# Patient Record
Sex: Male | Born: 1945 | Race: White | Hispanic: No | Marital: Married | Smoking: Former smoker
Health system: Southern US, Community
[De-identification: ages and names within clinical notes are randomized; demographics above are authoritative.]

## PROBLEM LIST (undated history)

## (undated) DIAGNOSIS — E119 Type 2 diabetes mellitus without complications: Secondary | ICD-10-CM

## (undated) DIAGNOSIS — C8590 Non-Hodgkin lymphoma, unspecified, unspecified site: Secondary | ICD-10-CM

## (undated) DIAGNOSIS — J449 Chronic obstructive pulmonary disease, unspecified: Secondary | ICD-10-CM

## (undated) DIAGNOSIS — I10 Essential (primary) hypertension: Secondary | ICD-10-CM

## (undated) DIAGNOSIS — N189 Chronic kidney disease, unspecified: Secondary | ICD-10-CM

## (undated) HISTORY — DX: Chronic kidney disease, unspecified: N18.9

## (undated) HISTORY — PX: APPENDECTOMY: SHX54

## (undated) HISTORY — PX: REPLACEMENT TOTAL KNEE: SUR1224

---

## 1999-04-18 ENCOUNTER — Emergency Department (HOSPITAL_COMMUNITY): Admission: EM | Admit: 1999-04-18 | Discharge: 1999-04-18 | Payer: Self-pay | Admitting: Emergency Medicine

## 2014-12-03 ENCOUNTER — Inpatient Hospital Stay (HOSPITAL_COMMUNITY)
Admission: EM | Admit: 2014-12-03 | Discharge: 2014-12-12 | DRG: 841 | Disposition: A | Discharge status: Home / Self Care | Payer: Medicare Other | Attending: Family Medicine | Admitting: Family Medicine

## 2014-12-03 ENCOUNTER — Emergency Department (HOSPITAL_COMMUNITY): Payer: Medicare Other

## 2014-12-03 ENCOUNTER — Encounter (HOSPITAL_COMMUNITY): Payer: Self-pay | Admitting: *Deleted

## 2014-12-03 ENCOUNTER — Inpatient Hospital Stay (HOSPITAL_COMMUNITY): Payer: Medicare Other

## 2014-12-03 DIAGNOSIS — E1140 Type 2 diabetes mellitus with diabetic neuropathy, unspecified: Secondary | ICD-10-CM | POA: Diagnosis present

## 2014-12-03 DIAGNOSIS — R1900 Intra-abdominal and pelvic swelling, mass and lump, unspecified site: Secondary | ICD-10-CM | POA: Diagnosis present

## 2014-12-03 DIAGNOSIS — Z87891 Personal history of nicotine dependence: Secondary | ICD-10-CM | POA: Diagnosis not present

## 2014-12-03 DIAGNOSIS — J441 Chronic obstructive pulmonary disease with (acute) exacerbation: Secondary | ICD-10-CM | POA: Diagnosis present

## 2014-12-03 DIAGNOSIS — N281 Cyst of kidney, acquired: Secondary | ICD-10-CM | POA: Diagnosis present

## 2014-12-03 DIAGNOSIS — N184 Chronic kidney disease, stage 4 (severe): Secondary | ICD-10-CM | POA: Diagnosis present

## 2014-12-03 DIAGNOSIS — M1712 Unilateral primary osteoarthritis, left knee: Secondary | ICD-10-CM | POA: Diagnosis present

## 2014-12-03 DIAGNOSIS — E46 Unspecified protein-calorie malnutrition: Secondary | ICD-10-CM | POA: Diagnosis present

## 2014-12-03 DIAGNOSIS — Z6833 Body mass index (BMI) 33.0-33.9, adult: Secondary | ICD-10-CM

## 2014-12-03 DIAGNOSIS — I959 Hypotension, unspecified: Secondary | ICD-10-CM | POA: Diagnosis present

## 2014-12-03 DIAGNOSIS — E669 Obesity, unspecified: Secondary | ICD-10-CM | POA: Diagnosis present

## 2014-12-03 DIAGNOSIS — N189 Chronic kidney disease, unspecified: Secondary | ICD-10-CM

## 2014-12-03 DIAGNOSIS — I712 Thoracic aortic aneurysm, without rupture: Secondary | ICD-10-CM | POA: Diagnosis present

## 2014-12-03 DIAGNOSIS — R5383 Other fatigue: Secondary | ICD-10-CM

## 2014-12-03 DIAGNOSIS — J9610 Chronic respiratory failure, unspecified whether with hypoxia or hypercapnia: Secondary | ICD-10-CM | POA: Diagnosis present

## 2014-12-03 DIAGNOSIS — Z9981 Dependence on supplemental oxygen: Secondary | ICD-10-CM

## 2014-12-03 DIAGNOSIS — R627 Adult failure to thrive: Secondary | ICD-10-CM | POA: Diagnosis present

## 2014-12-03 DIAGNOSIS — I129 Hypertensive chronic kidney disease with stage 1 through stage 4 chronic kidney disease, or unspecified chronic kidney disease: Secondary | ICD-10-CM | POA: Diagnosis present

## 2014-12-03 DIAGNOSIS — N19 Unspecified kidney failure: Secondary | ICD-10-CM | POA: Diagnosis not present

## 2014-12-03 DIAGNOSIS — C8290 Follicular lymphoma, unspecified, unspecified site: Secondary | ICD-10-CM | POA: Diagnosis not present

## 2014-12-03 DIAGNOSIS — I10 Essential (primary) hypertension: Secondary | ICD-10-CM | POA: Diagnosis not present

## 2014-12-03 DIAGNOSIS — M25569 Pain in unspecified knee: Secondary | ICD-10-CM

## 2014-12-03 DIAGNOSIS — E1122 Type 2 diabetes mellitus with diabetic chronic kidney disease: Secondary | ICD-10-CM

## 2014-12-03 DIAGNOSIS — N179 Acute kidney failure, unspecified: Secondary | ICD-10-CM | POA: Diagnosis present

## 2014-12-03 DIAGNOSIS — J449 Chronic obstructive pulmonary disease, unspecified: Secondary | ICD-10-CM | POA: Diagnosis not present

## 2014-12-03 DIAGNOSIS — C8293 Follicular lymphoma, unspecified, intra-abdominal lymph nodes: Principal | ICD-10-CM | POA: Diagnosis present

## 2014-12-03 DIAGNOSIS — C8333 Diffuse large B-cell lymphoma, intra-abdominal lymph nodes: Secondary | ICD-10-CM | POA: Diagnosis not present

## 2014-12-03 DIAGNOSIS — R062 Wheezing: Secondary | ICD-10-CM

## 2014-12-03 DIAGNOSIS — E1121 Type 2 diabetes mellitus with diabetic nephropathy: Secondary | ICD-10-CM

## 2014-12-03 DIAGNOSIS — R590 Localized enlarged lymph nodes: Secondary | ICD-10-CM | POA: Diagnosis present

## 2014-12-03 DIAGNOSIS — C8330 Diffuse large B-cell lymphoma, unspecified site: Secondary | ICD-10-CM

## 2014-12-03 HISTORY — DX: Essential (primary) hypertension: I10

## 2014-12-03 HISTORY — DX: Type 2 diabetes mellitus without complications: E11.9

## 2014-12-03 HISTORY — DX: Chronic obstructive pulmonary disease, unspecified: J44.9

## 2014-12-03 LAB — MAGNESIUM: Magnesium: 1.5 mg/dL — ABNORMAL LOW (ref 1.7–2.4)

## 2014-12-03 LAB — I-STAT TROPONIN, ED: TROPONIN I, POC: 0.02 ng/mL (ref 0.00–0.08)

## 2014-12-03 LAB — COMPREHENSIVE METABOLIC PANEL
ALBUMIN: 3.7 g/dL (ref 3.5–5.0)
ALT: 17 U/L (ref 17–63)
AST: 23 U/L (ref 15–41)
Alkaline Phosphatase: 57 U/L (ref 38–126)
Anion gap: 11 (ref 5–15)
BILIRUBIN TOTAL: 0.5 mg/dL (ref 0.3–1.2)
BUN: 62 mg/dL — ABNORMAL HIGH (ref 6–20)
CHLORIDE: 91 mmol/L — AB (ref 101–111)
CO2: 31 mmol/L (ref 22–32)
CREATININE: 5.8 mg/dL — AB (ref 0.61–1.24)
Calcium: >15 mg/dL (ref 8.9–10.3)
GFR calc Af Amer: 10 mL/min — ABNORMAL LOW (ref >60)
GFR, EST NON AFRICAN AMERICAN: 9 mL/min — AB (ref >60)
Glucose, Bld: 92 mg/dL (ref 65–99)
Potassium: 4.3 mmol/L (ref 3.5–5.1)
Sodium: 133 mmol/L — ABNORMAL LOW (ref 135–145)
Total Protein: 7.2 g/dL (ref 6.5–8.1)

## 2014-12-03 LAB — CBC
HCT: 41.3 % (ref 39.0–52.0)
Hemoglobin: 14 g/dL (ref 13.0–17.0)
MCH: 30.2 pg (ref 26.0–34.0)
MCHC: 33.9 g/dL (ref 30.0–36.0)
MCV: 89 fL (ref 78.0–100.0)
PLATELETS: 282 10*3/uL (ref 150–400)
RBC: 4.64 MIL/uL (ref 4.22–5.81)
RDW: 14 % (ref 11.5–15.5)
WBC: 9.1 10*3/uL (ref 4.0–10.5)

## 2014-12-03 LAB — BRAIN NATRIURETIC PEPTIDE: B Natriuretic Peptide: 102.8 pg/mL — ABNORMAL HIGH (ref 0.0–100.0)

## 2014-12-03 LAB — GLUCOSE, CAPILLARY
GLUCOSE-CAPILLARY: 43 mg/dL — AB (ref 65–99)
GLUCOSE-CAPILLARY: 59 mg/dL — AB (ref 65–99)
Glucose-Capillary: 60 mg/dL — ABNORMAL LOW (ref 65–99)

## 2014-12-03 LAB — PHOSPHORUS: Phosphorus: 4.9 mg/dL — ABNORMAL HIGH (ref 2.5–4.6)

## 2014-12-03 MED ORDER — HEPARIN SODIUM (PORCINE) 5000 UNIT/ML IJ SOLN
5000.0000 [IU] | Freq: Three times a day (TID) | INTRAMUSCULAR | Status: DC
  Administered 2014-12-04 (×2): 5000 [IU] via SUBCUTANEOUS
  Filled 2014-12-03 (×3): qty 1

## 2014-12-03 MED ORDER — CALCITONIN (SALMON) 200 UNIT/ML IJ SOLN
380.0000 [IU] | Freq: Two times a day (BID) | INTRAMUSCULAR | Status: AC
  Administered 2014-12-03 – 2014-12-06 (×6): 380 [IU] via INTRAMUSCULAR
  Filled 2014-12-03 (×9): qty 1.9

## 2014-12-03 MED ORDER — DEXTROSE 50 % IV SOLN
INTRAVENOUS | Status: AC
  Administered 2014-12-03: 21:00:00
  Filled 2014-12-03: qty 50

## 2014-12-03 MED ORDER — INSULIN ASPART 100 UNIT/ML ~~LOC~~ SOLN
0.0000 [IU] | SUBCUTANEOUS | Status: DC
  Administered 2014-12-05: 3 [IU] via SUBCUTANEOUS
  Administered 2014-12-05 – 2014-12-06 (×2): 2 [IU] via SUBCUTANEOUS
  Administered 2014-12-06 (×2): 1 [IU] via SUBCUTANEOUS
  Administered 2014-12-07: 2 [IU] via SUBCUTANEOUS

## 2014-12-03 MED ORDER — SODIUM CHLORIDE 0.9 % IV BOLUS (SEPSIS): 1000.0000 mL | Freq: Once | INTRAVENOUS | Status: DC

## 2014-12-03 MED ORDER — SODIUM CHLORIDE 0.9 % IJ SOLN
3.0000 mL | Freq: Two times a day (BID) | INTRAMUSCULAR | Status: DC
  Administered 2014-12-05 – 2014-12-11 (×9): 3 mL via INTRAVENOUS

## 2014-12-03 MED ORDER — INSULIN GLARGINE 100 UNIT/ML ~~LOC~~ SOLN
15.0000 [IU] | Freq: Every day | SUBCUTANEOUS | Status: DC
  Administered 2014-12-04 – 2014-12-09 (×6): 15 [IU] via SUBCUTANEOUS
  Filled 2014-12-03 (×8): qty 0.15

## 2014-12-03 MED ORDER — SODIUM CHLORIDE 0.9 % IV BOLUS (SEPSIS)
2000.0000 mL | Freq: Once | INTRAVENOUS | Status: AC
  Administered 2014-12-03: 2000 mL via INTRAVENOUS

## 2014-12-03 MED ORDER — SODIUM CHLORIDE 0.9 % IV SOLN
INTRAVENOUS | Status: AC
  Administered 2014-12-03 – 2014-12-04 (×2): via INTRAVENOUS
  Administered 2014-12-04: 1000 mL via INTRAVENOUS
  Administered 2014-12-04 – 2014-12-05 (×2): via INTRAVENOUS

## 2014-12-03 NOTE — ED Provider Notes (Signed)
CSN: [id number]     Arrival date & time 12/03/14  1553 History   First MD Initiated Contact with Patient 12/03/14 1754     Chief Complaint  Patient presents with  . Weakness  . Cough   Billy Gray is a 69 y.o. male with a history of COPD, hypertension, diabetes and renal disorder who presents to the emergency department complaining of generalized fatigue, low abdominal pain, and decreased appetite over the past 2 weeks. Patient reports over the past 2 weeks he has just been feeling very fatigued and has positive decreased appetite. He reports only eating a very small amount of food today. She reports lots of burping and acid reflux-like symptoms but has not been taking any Tums or acid reflux medication. Patient also reports having some positional lightheadedness. The patient reports he has a history of some kidney problems but he is unsure of exactly what. He reports he is diagnosed with a renal cyst but is not sure which kidney this was on. The patient reports he makes urine and is not on dialysis. Patient reports he has a chronic cough and is on 3 L of oxygen at home for his COPD. He denies any current shortness of breath. His last bowel movement was 2 days ago. The patient denies fevers, headaches, nausea, vomiting, chest pain, palpitations, current shortness of breath, urinary symptoms, hematuria, or rashes. His PCP is at the [facility].   (Consider location/radiation/quality/duration/timing/severity/associated sxs/prior Treatment) HPI  Past Medical History  Diagnosis Date  . COPD (chronic obstructive pulmonary disease)   . Hypertension   . Diabetes mellitus without complication   . Renal disorder    History reviewed. No pertinent past surgical history. History reviewed. No pertinent family history. History  Substance Use Topics  . Smoking status: Never Smoker   . Smokeless tobacco: Not on file  . Alcohol Use: Not on file    Review of Systems  Constitutional: Positive for chills,  appetite change and fatigue. Negative for fever.  HENT: Negative for congestion, ear pain and sore throat.   Eyes: Negative for pain and visual disturbance.  Respiratory: Positive for cough (chronic ). Negative for shortness of breath and wheezing.   Cardiovascular: Negative for chest pain and palpitations.  Gastrointestinal: Positive for abdominal pain. Negative for nausea, vomiting and diarrhea.  Genitourinary: Negative for dysuria, urgency, frequency, hematuria and difficulty urinating.  Musculoskeletal: Negative for back pain and neck pain.  Skin: Negative for rash.  Neurological: Positive for light-headedness. Negative for syncope and headaches.      Allergies  Review of patient's allergies indicates no known allergies.  Home Medications   Prior to Admission medications   Not on File   BP 128/71 mmHg  Pulse 74  Temp(Src) 98.7 F (37.1 C) (Oral)  Resp 17  SpO2 100% Physical Exam  Constitutional: He is oriented to person, place, and time. He appears well-developed and well-nourished. No distress.  Nontoxic appearing.  HENT:  Head: Normocephalic and atraumatic.  Mouth/Throat: Oropharynx is clear and moist. No oropharyngeal exudate.  Mucous membranes appear slightly dry.  Eyes: Conjunctivae are normal. Pupils are equal, round, and reactive to light. Right eye exhibits no discharge. Left eye exhibits no discharge.  Neck: Neck supple. No JVD present. No tracheal deviation present.  Cardiovascular: Normal rate, regular rhythm, normal heart sounds and intact distal pulses.  Exam reveals no gallop and no friction rub.   No murmur heard. Bilateral radial and dorsalis pedis pulses are intact.   Pulmonary/Chest: Effort normal  and breath sounds normal. No respiratory distress. He has no rales.  Mild scattered wheezes bilaterally but otherwise lungs are clear. On 3 L oxygen via [place].   Abdominal: Soft. He exhibits no distension and no mass. There is no tenderness. There is no rebound  and no guarding.  Abdomen is soft. Bowel sounds are hypoactive. No abdominal tenderness to palpation appreciated.  Musculoskeletal: He exhibits no edema.  Lymphadenopathy:    He has no cervical adenopathy.  Neurological: He is alert and oriented to person, place, and time. Coordination normal.  Skin: Skin is warm and dry. No rash noted. He is not diaphoretic. No erythema. No pallor.  Psychiatric: He has a normal mood and affect. His behavior is normal.  Nursing note and vitals reviewed.   ED Course  Procedures (including critical care time) Labs Review Labs Reviewed  BRAIN NATRIURETIC PEPTIDE - Abnormal; Notable for the following:    B Natriuretic Peptide 102.8 (*)    All other components within normal limits  COMPREHENSIVE METABOLIC PANEL - Abnormal; Notable for the following:    Sodium 133 (*)    Chloride 91 (*)    BUN 62 (*)    Creatinine, Ser 5.80 (*)    Calcium >15.0 (*)    GFR calc non Af Amer 9 (*)    GFR calc Af Amer 10 (*)    All other components within normal limits  CBC  I-STAT TROPOININ, ED  I-STAT TROPOININ, ED  POC OCCULT BLOOD, ED    Imaging Review Dg Chest 2 View  12/03/2014   CLINICAL DATA:  Fever and tachycardia  EXAM: CHEST  2 VIEW  COMPARISON:  None.  FINDINGS: The lungs are mildly hyperexpanded. There is scarring in the right base. There is mild central peribronchial thickening. There is a nipple shadow on the right. There is no edema or consolidation. The heart size is normal. The pulmonary vascularity is within normal limits. There is atherosclerotic change in aorta. There is no adenopathy. There is degenerative change in the thoracic spine.  IMPRESSION: Mild scarring right base. No edema or consolidation. Lungs mildly hyperexpanded with mild central peribronchial thickening. Suspect a degree of chronic bronchitis.   Electronically Signed   By: Lowella Grip III M.D.   On: 12/03/2014 16:55     EKG Interpretation   Date/Time:  Tuesday December 03 2014  16:23:23 EDT Ventricular Rate:  79 PR Interval:  272 QRS Duration: 96 QT Interval:  346 QTC Calculation: 396 R Axis:   -71 Text Interpretation:  Sinus rhythm with 1st degree A-V block Incomplete  right bundle branch block Left anterior fascicular block Possible  Anteroseptal infarct , age undetermined Abnormal ECG Confirmed by BEATON   MD, ROBERT ([id number]) on 12/03/2014 4:28:33 PM      Filed Vitals:   12/03/14 1621 12/03/14 1816 12/03/14 1821 12/03/14 1831  BP: 127/62 124/65  128/71  Pulse: 78  73 74  Temp: 98.7 F (37.1 C)     TempSrc: Oral     Resp: 16  19 17   SpO2: 97%  100% 100%     MDM   Meds given in ED:  Medications  0.9 %  sodium chloride infusion ( Intravenous New Bag/Given 12/03/14 2030)  insulin aspart (novoLOG) injection 0-9 Units (0 Units Subcutaneous Not Given 12/04/14 0107)  insulin glargine (LANTUS) injection 15 Units (15 Units Subcutaneous Not Given 12/04/14 0108)  calcitonin (MIACALCIN) injection 380 Units (380 Units Intramuscular Given 12/03/14 2115)  heparin injection 5,000 Units (5,000 Units  Subcutaneous Given 12/04/14 0108)  sodium chloride 0.9 % injection 3 mL (not administered)  sodium chloride 0.9 % bolus 2,000 mL (2,000 mLs Intravenous New Bag/Given 12/03/14 1826)  dextrose 50 % solution (  Given 12/03/14 2030)  dextrose 50 % solution (50 mLs  Given 12/04/14 0104)  gi cocktail (Maalox,Lidocaine,Donnatal) (30 mLs Oral Given 12/04/14 0104)    Current Discharge Medication List      Final diagnoses:  Hypercalcemia  Other fatigue   This is a 69 y.o. male with a history of COPD, hypertension, diabetes and renal disorder who presents to the emergency department complaining of generalized fatigue, low abdominal pain, and decreased appetite over the past 2 weeks. Patient reports over the past 2 weeks he has just been feeling very fatigued and has positive decreased appetite.  On exam the patient is afebrile nontoxic appearing. His abdomen is soft and nontender  palpation. The patient is making urine and has urinated in a urinal in his room. The patient's CMP returned with a calcium of greater than 15. Creatinine is 5.8. CBC is unremarkable. Patient given 2 L fluid bolus in ED. Chest x-ray shows chronic bronchitis. Patient with acute renal failure and hypercalcemia. Plan is to admit. Patient accepted for admission by Dr. Alcario Drought. Patient is in agreement with admission.   This patient was discussed with and evaluated by Dr. Darl Householder who agrees with assessment and plan.    Waynetta Pean, PA-C 12/04/14 0217  Wandra Arthurs, MD 12/05/14 [phone]

## 2014-12-03 NOTE — ED Notes (Signed)
PA at bedside.

## 2014-12-03 NOTE — H&P (Signed)
[facility] History and Physical  Billy Gray HQI:[id number] DOB: [birth year] DOA: 12/03/2014  Referring physician: EDP PCP: Pcp Not In System   Chief Complaint: Fatigue   HPI: Billy Gray is a 69 y.o. male who presents to the ED with c/o fatigue, generalized weakness, cough, decreased PO intake for the past 2 weeks or so.  Taking small amount of tums or peptobismol recently.  Has h/o kidney problems, diagnosed 3 months ago with CKD at the [place] [facility] and told his "creatinine was near needing dialysis".  Chronic cough, on 3L o2 at home for COPD.  Was also diagnosed with renal cyst at the [place] but unsure which side this was on.  Review of Systems: Systems reviewed.  As above, otherwise negative  Past Medical History  Diagnosis Date  . COPD (chronic obstructive pulmonary disease)   . Hypertension   . Diabetes mellitus without complication   . Renal disorder    History reviewed. No pertinent past surgical history. Social History:  reports that he has never smoked. He does not have any smokeless tobacco history on file. His alcohol and drug histories are not on file.  No Known Allergies  History reviewed. No pertinent family history.   Prior to Admission medications   Not on File   Physical Exam: Filed Vitals:   12/03/14 1831  BP: 128/71  Pulse: 74  Temp:   Resp: 17    BP 128/71 mmHg  Pulse 74  Temp(Src) 98.7 F (37.1 C) (Oral)  Resp 17  Ht 5\' 8"  (1.727 m)  Wt 95.255 kg (210 lb)  BMI 31.94 kg/m2  SpO2 100%  General Appearance:    Alert, oriented, no distress, appears stated age  Head:    Normocephalic, atraumatic  Eyes:    PERRL, EOMI, sclera non-icteric        Nose:   Nares without drainage or epistaxis. Mucosa, turbinates normal  Throat:   Moist mucous membranes. Oropharynx without erythema or exudate.  Neck:   Supple. No carotid bruits.  No thyromegaly.  No lymphadenopathy.   Back:     No CVA tenderness, no spinal tenderness  Lungs:     Clear to  auscultation bilaterally, without wheezes, rhonchi or rales  Chest wall:    No tenderness to palpitation  Heart:    Regular rate and rhythm without murmurs, gallops, rubs  Abdomen:     Soft, non-tender, nondistended, normal bowel sounds, no organomegaly  Genitalia:    deferred  Rectal:    deferred  Extremities:   No clubbing, cyanosis or edema.  Pulses:   2+ and symmetric all extremities  Skin:   Skin color, texture, turgor normal, no rashes or lesions  Lymph nodes:   Cervical, supraclavicular, and axillary nodes normal  Neurologic:   CNII-XII intact. Normal strength, sensation and reflexes      throughout    Labs on Admission:  Basic Metabolic Panel:  Recent Labs Lab 12/03/14 1631  NA 133*  K 4.3  CL 91*  CO2 31  GLUCOSE 92  BUN 62*  CREATININE 5.80*  CALCIUM >15.0*   Liver Function Tests:  Recent Labs Lab 12/03/14 1631  AST 23  ALT 17  ALKPHOS 57  BILITOT 0.5  PROT 7.2  ALBUMIN 3.7   No results for input(s): LIPASE, AMYLASE in the last 168 hours. No results for input(s): AMMONIA in the last 168 hours. CBC:  Recent Labs Lab 12/03/14 1631  WBC 9.1  HGB 14.0  HCT 41.3  MCV 89.0  PLT 282   Cardiac Enzymes: No results for input(s): CKTOTAL, CKMB, CKMBINDEX, TROPONINI in the last 168 hours.  BNP (last 3 results) No results for input(s): PROBNP in the last 8760 hours. CBG: No results for input(s): GLUCAP in the last 168 hours.  Radiological Exams on Admission: Dg Chest 2 View  12/03/2014   CLINICAL DATA:  Fever and tachycardia  EXAM: CHEST  2 VIEW  COMPARISON:  None.  FINDINGS: The lungs are mildly hyperexpanded. There is scarring in the right base. There is mild central peribronchial thickening. There is a nipple shadow on the right. There is no edema or consolidation. The heart size is normal. The pulmonary vascularity is within normal limits. There is atherosclerotic change in aorta. There is no adenopathy. There is degenerative change in the thoracic  spine.  IMPRESSION: Mild scarring right base. No edema or consolidation. Lungs mildly hyperexpanded with mild central peribronchial thickening. Suspect a degree of chronic bronchitis.   Electronically Signed   By: Lowella Grip III M.D.   On: 12/03/2014 16:55    EKG: Independently reviewed.  Assessment/Plan Principal Problem:   Hypercalcemia Active Problems:   Acute on chronic kidney failure   HTN (hypertension)   DM2 (diabetes mellitus, type 2)   1. Hypercalcemia - ddx includes 1. Malignancy: getting CT w/o contrast of chest/abd/pelvis to look for large tumor burden small cell, also getting PTHrp 2. Hyperparathyroidism - checking PTH 3. MM - less likely given the normal albumin and protein levels, but if other work up is negative would look further into this. 4. Does take 2000 iu vit D daily but this is unlikely to cause hypercalcemia to this extreme degree. 5. Treating with IVF, calcitonin per pharm consult.  BMP checks Q6H. 2. 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 attempting to rehydrate 2. baseline creatinine unknown 3. Likely will need nephrology consult in AM if no improvement, or at least nephrology follow up outpatient even if he improves given that he apparently has fairly severe CKD at baseline 3. HTN - holding lisinopril - HCTZ as nephrotoxic meds 4. DM2 - lantus 15 QHS, adding Q4H low dose SSI.    Code Status: Full  Family Communication: Family in room Disposition Plan: Admit to inpatient   Time spent: 66 min  GARDNER, JARED M. [facility] Pager [phone]  If 7AM-7PM, please contact the day team taking care of the patient [email or web address] Password [facility] 12/03/2014, 7:46 PM

## 2014-12-03 NOTE — Progress Notes (Signed)
PHARMACY CONSULT for Calcitonin  Indication: Hypercalcemia  69 yo male admitted with fatigue, weakness, and decreased PO intake. Pt was recently diagnosed with CKD, unclear what his baseline Scr or baseline Ca2+ is, currently SCr up to 5.8, eCrCl 10-15 ml/min, calcium > 15.   Plan -Start with calcitonin 4 IU/kg IM q12h -After 2 days if there is no improvement in Calcium, may increase dose -F/u PTH, repeat Ca tomorrow    Hughes Better, PharmD, BCPS Clinical Pharmacist Pager: [phone] 12/03/2014 8:04 PM

## 2014-12-03 NOTE — ED Notes (Signed)
Pt in c/o generalized fatigue and cough for the last week, states he hasn't been eating or drinking, denies n/v but states he has no appetite, arrived on 3L via [place], no distress noted

## 2014-12-04 ENCOUNTER — Inpatient Hospital Stay (HOSPITAL_COMMUNITY): Payer: Medicare Other

## 2014-12-04 DIAGNOSIS — R590 Localized enlarged lymph nodes: Secondary | ICD-10-CM | POA: Insufficient documentation

## 2014-12-04 LAB — BASIC METABOLIC PANEL
ANION GAP: 9 (ref 5–15)
Anion gap: 10 (ref 5–15)
Anion gap: 11 (ref 5–15)
Anion gap: 9 (ref 5–15)
Anion gap: 8 (ref 5–15)
BUN: 59 mg/dL — ABNORMAL HIGH (ref 6–20)
BUN: 60 mg/dL — ABNORMAL HIGH (ref 6–20)
BUN: 62 mg/dL — ABNORMAL HIGH (ref 6–20)
BUN: 61 mg/dL — AB (ref 6–20)
BUN: 61 mg/dL — AB (ref 6–20)
CALCIUM: 13.8 mg/dL — AB (ref 8.9–10.3)
CHLORIDE: 95 mmol/L — AB (ref 101–111)
CHLORIDE: 98 mmol/L — AB (ref 101–111)
CO2: 34 mmol/L — ABNORMAL HIGH (ref 22–32)
CO2: 29 mmol/L (ref 22–32)
CO2: 29 mmol/L (ref 22–32)
CO2: 30 mmol/L (ref 22–32)
CO2: 27 mmol/L (ref 22–32)
CREATININE: 5.43 mg/dL — AB (ref 0.61–1.24)
CREATININE: 5.17 mg/dL — AB (ref 0.61–1.24)
Calcium: 14.5 mg/dL (ref 8.9–10.3)
Calcium: 13.6 mg/dL (ref 8.9–10.3)
Calcium: 12.9 mg/dL — ABNORMAL HIGH (ref 8.9–10.3)
Chloride: 94 mmol/L — ABNORMAL LOW (ref 101–111)
Chloride: 99 mmol/L — ABNORMAL LOW (ref 101–111)
Chloride: 102 mmol/L (ref 101–111)
Creatinine, Ser: 5.44 mg/dL — ABNORMAL HIGH (ref 0.61–1.24)
Creatinine, Ser: 5.09 mg/dL — ABNORMAL HIGH (ref 0.61–1.24)
Creatinine, Ser: 4.8 mg/dL — ABNORMAL HIGH (ref 0.61–1.24)
GFR calc non Af Amer: 10 mL/min — ABNORMAL LOW (ref >60)
GFR calc non Af Amer: 11 mL/min — ABNORMAL LOW (ref >60)
GFR calc non Af Amer: 11 mL/min — ABNORMAL LOW (ref >60)
GFR, EST AFRICAN AMERICAN: 11 mL/min — AB (ref >60)
GFR, EST AFRICAN AMERICAN: 11 mL/min — AB (ref >60)
GFR, EST AFRICAN AMERICAN: 12 mL/min — AB (ref >60)
GFR, EST AFRICAN AMERICAN: 12 mL/min — AB (ref >60)
GFR, EST AFRICAN AMERICAN: 13 mL/min — AB (ref >60)
GFR, EST NON AFRICAN AMERICAN: 10 mL/min — AB (ref >60)
GFR, EST NON AFRICAN AMERICAN: 10 mL/min — AB (ref >60)
GLUCOSE: 74 mg/dL (ref 65–99)
GLUCOSE: 83 mg/dL (ref 65–99)
GLUCOSE: 118 mg/dL — AB (ref 65–99)
Glucose, Bld: 116 mg/dL — ABNORMAL HIGH (ref 65–99)
Glucose, Bld: 87 mg/dL (ref 65–99)
POTASSIUM: 3.9 mmol/L (ref 3.5–5.1)
POTASSIUM: 3.9 mmol/L (ref 3.5–5.1)
POTASSIUM: 4.1 mmol/L (ref 3.5–5.1)
Potassium: 4.1 mmol/L (ref 3.5–5.1)
Potassium: 4 mmol/L (ref 3.5–5.1)
SODIUM: 137 mmol/L (ref 135–145)
SODIUM: 137 mmol/L (ref 135–145)
Sodium: 138 mmol/L (ref 135–145)
Sodium: 135 mmol/L (ref 135–145)
Sodium: 137 mmol/L (ref 135–145)

## 2014-12-04 LAB — GLUCOSE, CAPILLARY
GLUCOSE-CAPILLARY: 77 mg/dL (ref 65–99)
GLUCOSE-CAPILLARY: 77 mg/dL (ref 65–99)
GLUCOSE-CAPILLARY: 81 mg/dL (ref 65–99)
Glucose-Capillary: 101 mg/dL — ABNORMAL HIGH (ref 65–99)
Glucose-Capillary: 116 mg/dL — ABNORMAL HIGH (ref 65–99)

## 2014-12-04 MED ORDER — ZOLPIDEM TARTRATE 5 MG PO TABS
5.0000 mg | ORAL_TABLET | Freq: Every evening | ORAL | Status: DC | PRN
  Administered 2014-12-04 – 2014-12-11 (×7): 5 mg via ORAL
  Filled 2014-12-04 (×7): qty 1

## 2014-12-04 MED ORDER — GI COCKTAIL ~~LOC~~
30.0000 mL | Freq: Once | ORAL | Status: AC
  Administered 2014-12-04: 30 mL via ORAL
  Filled 2014-12-04: qty 30

## 2014-12-04 MED ORDER — DEXTROSE 50 % IV SOLN
INTRAVENOUS | Status: AC
  Administered 2014-12-04: 50 mL
  Filled 2014-12-04: qty 50

## 2014-12-04 MED ORDER — HEPARIN SODIUM (PORCINE) 5000 UNIT/ML IJ SOLN
5000.0000 [IU] | Freq: Three times a day (TID) | INTRAMUSCULAR | Status: DC
  Administered 2014-12-06 – 2014-12-12 (×19): 5000 [IU] via SUBCUTANEOUS
  Filled 2014-12-04 (×24): qty 1

## 2014-12-04 NOTE — Consult Note (Signed)
Chief Complaint: Chief Complaint  Patient presents with  . Weakness  . Cough  retroperitoneal lymphadenopathy  Referring Physician(s): Dr Georgiann Mohs  History of Present Illness: Billy Gray is a 69 y.o. male   Pt presents to ED with extreme fatigue and weakness Wt loss; no appetite Work up shows hypercalcemia CT reveals retroperitoneal lymphadenopathy Request made for bx of same Dr Vernard Gambles has reviewed imaging and approves procedure  Past Medical History  Diagnosis Date  . COPD (chronic obstructive pulmonary disease)   . Hypertension   . Diabetes mellitus without complication   . Renal disorder     History reviewed. No pertinent past surgical history.  Allergies: Review of patient's allergies indicates no known allergies.  Medications: Prior to Admission medications   Medication Sig Start Date End Date Taking? Authorizing Provider  acetaminophen (TYLENOL) 500 MG tablet Take 500 mg by mouth every 6 (six) hours as needed for moderate pain.   Yes Historical Provider, MD  albuterol (PROVENTIL HFA;VENTOLIN HFA) 108 (90 BASE) MCG/ACT inhaler Inhale 2 puffs into the lungs every 6 (six) hours as needed for wheezing or shortness of breath.   Yes Historical Provider, MD  allopurinol (ZYLOPRIM) 300 MG tablet Take 150 mg by mouth daily.   Yes Historical Provider, MD  budesonide-formoterol (SYMBICORT) 160-4.5 MCG/ACT inhaler Inhale 2 puffs into the lungs every evening.   Yes Historical Provider, MD  glipiZIDE (GLUCOTROL) 10 MG tablet Take 10 mg by mouth daily before breakfast.   Yes Historical Provider, MD  insulin glargine (LANTUS) 100 UNIT/ML injection Inject 30 Units into the skin at bedtime.   Yes Historical Provider, MD  pravastatin (PRAVACHOL) 40 MG tablet Take 40 mg by mouth every evening.   Yes Historical Provider, MD  traZODone (DESYREL) 50 MG tablet Take 50 mg by mouth at bedtime.   Yes Historical Provider, MD  zolpidem (AMBIEN) 10 MG tablet Take 10 mg by mouth at bedtime.    Yes Historical Provider, MD  lisinopril-hydrochlorothiazide (PRINZIDE,ZESTORETIC) 20-12.5 MG per tablet Take 1 tablet by mouth daily.    Historical Provider, MD     History reviewed. No pertinent family history.  History   Social History  . Marital Status: Married    Spouse Name: N/A  . Number of Children: N/A  . Years of Education: N/A   Social History Main Topics  . Smoking status: Never Smoker   . Smokeless tobacco: Not on file  . Alcohol Use: Not on file  . Drug Use: Not on file  . Sexual Activity: Not on file   Other Topics Concern  . None   Social History Narrative  . None     Review of Systems: A 12 point ROS discussed and pertinent positives are indicated in the HPI above.  All other systems are negative.  Review of Systems  Constitutional: Positive for activity change, appetite change, fatigue and unexpected weight change. Negative for fever.  Respiratory: Negative for shortness of breath.   Gastrointestinal: Positive for abdominal pain.  Neurological: Positive for weakness.  Psychiatric/Behavioral: Negative for behavioral problems and confusion.    Vital Signs: BP 103/64 mmHg  Pulse 82  Temp(Src) 97.5 F (36.4 C) (Oral)  Resp 21  Ht 5\' 8"  (1.727 m)  Wt 210 lb (95.255 kg)  BMI 31.94 kg/m2  SpO2 99%  Physical Exam  Constitutional: He is oriented to person, place, and time.  Cardiovascular: Normal rate and regular rhythm.   No murmur heard. Pulmonary/Chest: Effort normal. He has no wheezes.  Abdominal: Soft. Bowel sounds are normal. He exhibits distension. There is tenderness.  Musculoskeletal: Normal range of motion.  Neurological: He is alert and oriented to person, place, and time.  Skin: Skin is warm.  Psychiatric: He has a normal mood and affect. His behavior is normal. Judgment and thought content normal.  Nursing note and vitals reviewed.   Mallampati Score:  MD Evaluation Airway: WNL Heart: WNL Abdomen: WNL Chest/ Lungs: WNL ASA   Classification: 3 Mallampati/Airway Score: One  Imaging: Ct Abdomen Pelvis Wo Contrast  12/03/2014   CLINICAL DATA:  Generalized fatigue and cough for a week. No appetite.  EXAM: CT CHEST, ABDOMEN AND PELVIS WITHOUT CONTRAST  TECHNIQUE: Multidetector CT imaging of the chest, abdomen and pelvis was performed following the standard protocol without IV contrast.  COMPARISON:  None.  FINDINGS: CT CHEST FINDINGS  Normal heart size. Coronary artery calcifications. 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 Aortic calcification. No definite lymphadenopathy in the chest although non conscious examination limits evaluation of hilar lymph nodes. Esophagus is decompressed.  Mild linear atelectasis or fibrosis in the lung bases. No airspace disease or consolidation. No parenchymal mass lesion identified. Mild emphysematous changes in the upper lungs. No pleural effusions. No pneumothorax.  CT ABDOMEN AND PELVIS FINDINGS  The unenhanced appearance of the liver, spleen, gallbladder, pancreas, adrenal glands, is unremarkable. Kidneys appear atrophic without hydronephrosis. Multiple bilateral renal mass lesions probably representing cysts. Calcification of the aorta without aneurysm. There is extensive lymphadenopathy in the retroperitoneum involving predominantly the pericaval region and extending from the abdominal hiatus down into the upper pelvis. Conglomerate mass of lymph nodes in the pericaval region measures up to about 14.3 x 12.3 x 7.4 cm. Additional enlarged lymph nodes are also demonstrated in the periaortic regions. With the large lymph node mass displaces the aorta and partially surrounds the aorta. Inferior vena cava cannot be distinguished from the mass. This is likely to represent lymphoma although sarcoma or other retroperitoneal mass could also have this appearance. Stomach, small bowel, and colon are not abnormally distended. No free air or free fluid in the abdomen. Mild infiltration in the mesenteries  probably reactive. Abdominal wall musculature appears intact.  Pelvis: Prostate gland is mildly enlarged. Bladder wall is not thickened. Appendix is not identified. No free or loculated pelvic fluid collections. Small bilateral inguinal hernias containing fat.  Bones: Diffuse degenerative changes throughout the thoracic and lumbar spine. No vertebral compression deformities. No destructive bone lesions. Degenerative changes in the hips. Sacrum and pelvis appear intact.  IMPRESSION: No evidence of active pulmonary disease. 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 Recommend annual imaging followup by CTA or MRA. This recommendation follows 2010 ACCF/AHA/AATS/ACR/ASA/SCA/SCAI/SIR/STS/SVM Guidelines for the Diagnosis and Management of Patients with Thoracic Aortic Disease. Circulation. 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 Multiple bilateral renal lesions likely representing cysts. Bilateral inguinal hernias containing fat.   Electronically Signed   By: Lucienne Capers M.D.   On: 12/03/2014 23:15   Dg Chest 2 View  12/03/2014   CLINICAL DATA:  Fever and tachycardia  EXAM: CHEST  2 VIEW  COMPARISON:  None.  FINDINGS: The lungs are mildly hyperexpanded. There is scarring in the right base. There is mild central peribronchial thickening. There is a nipple shadow on the right. There is no edema or consolidation. The heart size is normal. The pulmonary vascularity is within normal limits. There is atherosclerotic change in aorta. There is no adenopathy. There is degenerative change in  the thoracic spine.  IMPRESSION: Mild scarring right base. No edema or consolidation. Lungs mildly hyperexpanded with mild central peribronchial thickening. Suspect a degree of chronic bronchitis.   Electronically Signed   By: Lowella Grip III M.D.   On: 12/03/2014 16:55   Dg Knee 1-2 Views Left  12/04/2014   CLINICAL DATA:  Medial left knee swelling at distal femur.  Pain with weight-bearing. No injury.  EXAM: LEFT KNEE - 1-2 VIEW  COMPARISON:  None.  FINDINGS: Examination demonstrates moderate osteoarthritic change involving the lateral compartment and to a lesser extent involving the medial compartment and patellofemoral joints. No evidence of fracture or dislocation. Suggestion of a significant joint effusion. Mild calcified plaque over the femoral artery.  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 Significant joint effusion.   Electronically Signed   By: Marin Olp M.D.   On: 12/04/2014 13:41   Ct Chest Wo Contrast  12/03/2014   CLINICAL DATA:  Generalized fatigue and cough for a week. No appetite.  EXAM: CT CHEST, ABDOMEN AND PELVIS WITHOUT CONTRAST  TECHNIQUE: Multidetector CT imaging of the chest, abdomen and pelvis was performed following the standard protocol without IV contrast.  COMPARISON:  None.  FINDINGS: CT CHEST FINDINGS  Normal heart size. Coronary artery calcifications. 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 Aortic calcification. No definite lymphadenopathy in the chest although non conscious examination limits evaluation of hilar lymph nodes. Esophagus is decompressed.  Mild linear atelectasis or fibrosis in the lung bases. No airspace disease or consolidation. No parenchymal mass lesion identified. Mild emphysematous changes in the upper lungs. No pleural effusions. No pneumothorax.  CT ABDOMEN AND PELVIS FINDINGS  The unenhanced appearance of the liver, spleen, gallbladder, pancreas, adrenal glands, is unremarkable. Kidneys appear atrophic without hydronephrosis. Multiple bilateral renal mass lesions probably representing cysts. Calcification of the aorta without aneurysm. There is extensive lymphadenopathy in the retroperitoneum involving predominantly the pericaval region and extending from the abdominal hiatus down into the upper pelvis. Conglomerate mass of lymph nodes in the  pericaval region measures up to about 14.3 x 12.3 x 7.4 cm. Additional enlarged lymph nodes are also demonstrated in the periaortic regions. With the large lymph node mass displaces the aorta and partially surrounds the aorta. Inferior vena cava cannot be distinguished from the mass. This is likely to represent lymphoma although sarcoma or other retroperitoneal mass could also have this appearance. Stomach, small bowel, and colon are not abnormally distended. No free air or free fluid in the abdomen. Mild infiltration in the mesenteries probably reactive. Abdominal wall musculature appears intact.  Pelvis: Prostate gland is mildly enlarged. Bladder wall is not thickened. Appendix is not identified. No free or loculated pelvic fluid collections. Small bilateral inguinal hernias containing fat.  Bones: Diffuse degenerative changes throughout the thoracic and lumbar spine. No vertebral compression deformities. No destructive bone lesions. Degenerative changes in the hips. Sacrum and pelvis appear intact.  IMPRESSION: No evidence of active pulmonary disease. 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 Recommend annual imaging followup by CTA or MRA. This recommendation follows 2010 ACCF/AHA/AATS/ACR/ASA/SCA/SCAI/SIR/STS/SVM Guidelines for the Diagnosis and Management of Patients with Thoracic Aortic Disease. Circulation. 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 Multiple bilateral renal lesions likely representing cysts. Bilateral inguinal hernias containing fat.   Electronically Signed   By: Lucienne Capers M.D.   On: 12/03/2014 23:15    Labs:  CBC:  Recent Labs  12/03/14 1631  WBC 9.1  HGB 14.0  HCT 41.3  PLT 282    COAGS: No results for input(s): INR, APTT in the last 8760 hours.  BMP:  Recent Labs  12/03/14 1631 12/03/14 2255 12/04/14 0338 12/04/14 1110  NA 133* 138 135 137  K 4.3 3.9 3.9 4.1  CL 91* 94* 95* 99*  CO2 31 34* 29 29   GLUCOSE 92 74 116* 87  BUN 62* 59* 60* 62*  CALCIUM >15.0* >15.0* 14.5* 13.8*  CREATININE 5.80* 5.44* 5.43* 5.17*  GFRNONAA 9* 10* 10* 10*  GFRAA 10* 11* 11* 12*    LIVER FUNCTION TESTS:  Recent Labs  12/03/14 1631  BILITOT 0.5  AST 23  ALT 17  ALKPHOS 57  PROT 7.2  ALBUMIN 3.7    TUMOR MARKERS: No results for input(s): AFPTM, CEA, CA199, CHROMGRNA in the last 8760 hours.  Assessment and Plan:  RP LAN Weakness; wt loss Scheduled for bx 6/16 in Rad Risks and Benefits discussed with the patient including, but not limited to bleeding, infection, damage to adjacent structures or low yield requiring additional tests. All of the patient's questions were answered, patient is agreeable to proceed. Consent signed and in chart.   Thank you for this interesting consult.  I greatly enjoyed meeting [facility] and look forward to participating in their care.  Signed: Barrie Sigmund A 12/04/2014, 2:08 PM   I spent a total of 40 Minutes    in face to face in clinical consultation, greater than 50% of which was counseling/coordinating care for RP LAN bx

## 2014-12-04 NOTE — Progress Notes (Signed)
New Admission Note: late entry  Arrival Method: stretcher  Mental Orientation: alert and oriented x 4Telemetry:box 29 Assessment: Completed Skin:intact DL:[id number] a/c nsl Pain:denies Tubes:none Safety Measures: Safety Fall Prevention Plan has been given, discussed and signed Admission: Completed [address] Orientation: Patient has been orientated to the room, unit and staff.  Family:at bedside on arrival to unit  Orders have been reviewed and implemented. Will continue to monitor the patient. Call light has been placed within reach and bed alarm has been activated.   Amado Coe, RN Phone number: [phone]

## 2014-12-04 NOTE — Progress Notes (Signed)
Text paged M.D. about the one time episode of 0.04 pause and one time episode of heart rate of 30/min non sustained.Patient is asymptomatic.No order received.

## 2014-12-04 NOTE — Progress Notes (Signed)
[facility] PROGRESS NOTE  Billy Gray XNA:[id number] DOB: Feb 04, 1946 DOA: 12/03/2014 PCP: Pcp Not In System  Assessment/Plan: 1. Hypercalcemia- patient came with calcium of 15.0, this morning calcium is down to 13.8 after started IV hydration with normal saline. Nephrology has been consulted, IV pamidronate will be started as per nephrology. Patient also started on calcitonin. 2. Malignancy- CT abdomen reveals retroperitoneal lymphadenopathy, Conglomerate mass of lymph nodes in the pericaval region measures up to about 14.3 x 12.3 x 7.4 cm. Additional enlarged lymph nodes are also demonstrated in the periaortic regions. Called and discussed with hematology/oncology on call Dr. Alen Blew, who recommends getting the core biopsy per IR. Will consult IR for the core biopsy. 3. Left knee swelling- chronic patient has been told by [facility] that he will need left knee  Replacement. Will check x-ray of the left knee. 4. 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 Nephrology has been consulted. HCTZ and ACE inhibitor have been held per nephrology. 5. Diabetes mellitus- continue sliding scale insulin, Lantus. 6. Thoracic aortic aneurysm- CT chest shows 4.1 cm of ascending thoracic aortic aneurysm, patient will need yearly CT chest to assess the growth. I discussed with patient and his wife at bedside 7. Hypertension- patient is currently hypotensive, both HCTZ as well as ACE inhibitor are on hold.  Code Status: Full code Family Communication: Discussed with patient's wife at bedside Disposition Plan: Home when stable   Consultants:  Nephrology  Procedures:  None  Antibiotics:  None  HPI/Subjective: 69 y.o. male who presents to the ED with c/o fatigue, generalized weakness, cough, decreased PO intake for the past 2 weeks or so. Taking small amount of tums or peptobismol recently. Has h/o kidney problems, diagnosed 3  months ago with CKD at the [place] [facility] and told his "creatinine was near needing dialysis". Chronic cough, on 3L o2 at home for COPD. Was also diagnosed with renal cyst at the [place] but unsure which side this was on.  This morning patient denies any chest pain or shortness of breath no nausea vomiting. Has knee brace the left leg   Objective: Filed Vitals:   12/04/14 0858  BP: 103/64  Pulse: 82  Temp: 97.5 F (36.4 C)  Resp: 21    Intake/Output Summary (Last 24 hours) at 12/04/14 1449 Last data filed at 12/04/14 0616  Gross per 24 hour  Intake 1582.5 ml  Output    301 ml  Net 1281.5 ml   Filed Weights   12/03/14 1900  Weight: 95.255 kg (210 lb)    Exam:   General:  Appears in no acute distress  Cardiovascular: S1-S2 is regular, no murmurs rubs or gallops  Respiratory: Clear to auscultation bilaterally  Abdomen: Soft, nontender, no organomegaly  Musculoskeletal: Knee brace in the left leg, with swelling noted in the left knee   Data Reviewed: Basic Metabolic Panel:  Recent Labs Lab 12/03/14 1631 12/03/14 2104 12/03/14 2255 12/04/14 0338 12/04/14 1110  NA 133*  --  138 135 137  K 4.3  --  3.9 3.9 4.1  CL 91*  --  94* 95* 99*  CO2 31  --  34* 29 29  GLUCOSE 92  --  74 116* 87  BUN 62*  --  59* 60* 62*  CREATININE 5.80*  --  5.44* 5.43* 5.17*  CALCIUM >15.0*  --  >15.0* 14.5* 13.8*  MG  --  1.5*  --   --   --  PHOS  --  4.9*  --   --   --    Liver Function Tests:  Recent Labs Lab 12/03/14 1631  AST 23  ALT 17  ALKPHOS 57  BILITOT 0.5  PROT 7.2  ALBUMIN 3.7   No results for input(s): LIPASE, AMYLASE in the last 168 hours. No results for input(s): AMMONIA in the last 168 hours. CBC:  Recent Labs Lab 12/03/14 1631  WBC 9.1  HGB 14.0  HCT 41.3  MCV 89.0  PLT 282   Cardiac Enzymes: No results for input(s): CKTOTAL, CKMB, CKMBINDEX, TROPONINI in the last 168 hours. BNP (last 3 results)  Recent Labs  12/03/14 1631  BNP 102.8*     ProBNP (last 3 results) No results for input(s): PROBNP in the last 8760 hours.  CBG:  Recent Labs Lab 12/03/14 2040 12/03/14 2341 12/04/14 0351 12/04/14 0804  GLUCAP 43* 60*  59* 116* 77    No results found for this or any previous visit (from the past 240 hour(s)).   Studies: Ct Abdomen Pelvis Wo Contrast  12/03/2014   CLINICAL DATA:  Generalized fatigue and cough for a week. No appetite.  EXAM: CT CHEST, ABDOMEN AND PELVIS WITHOUT CONTRAST  TECHNIQUE: Multidetector CT imaging of the chest, abdomen and pelvis was performed following the standard protocol without IV contrast.  COMPARISON:  None.  FINDINGS: CT CHEST FINDINGS  Normal heart size. Coronary artery calcifications. 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 Aortic calcification. No definite lymphadenopathy in the chest although non conscious examination limits evaluation of hilar lymph nodes. Esophagus is decompressed.  Mild linear atelectasis or fibrosis in the lung bases. No airspace disease or consolidation. No parenchymal mass lesion identified. Mild emphysematous changes in the upper lungs. No pleural effusions. No pneumothorax.  CT ABDOMEN AND PELVIS FINDINGS  The unenhanced appearance of the liver, spleen, gallbladder, pancreas, adrenal glands, is unremarkable. Kidneys appear atrophic without hydronephrosis. Multiple bilateral renal mass lesions probably representing cysts. Calcification of the aorta without aneurysm. There is extensive lymphadenopathy in the retroperitoneum involving predominantly the pericaval region and extending from the abdominal hiatus down into the upper pelvis. Conglomerate mass of lymph nodes in the pericaval region measures up to about 14.3 x 12.3 x 7.4 cm. Additional enlarged lymph nodes are also demonstrated in the periaortic regions. With the large lymph node mass displaces the aorta and partially surrounds the aorta. Inferior vena cava cannot be distinguished from the mass. This is likely to  represent lymphoma although sarcoma or other retroperitoneal mass could also have this appearance. Stomach, small bowel, and colon are not abnormally distended. No free air or free fluid in the abdomen. Mild infiltration in the mesenteries probably reactive. Abdominal wall musculature appears intact.  Pelvis: Prostate gland is mildly enlarged. Bladder wall is not thickened. Appendix is not identified. No free or loculated pelvic fluid collections. Small bilateral inguinal hernias containing fat.  Bones: Diffuse degenerative changes throughout the thoracic and lumbar spine. No vertebral compression deformities. No destructive bone lesions. Degenerative changes in the hips. Sacrum and pelvis appear intact.  IMPRESSION: No evidence of active pulmonary disease. 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 Recommend annual imaging followup by CTA or MRA. This recommendation follows 2010 ACCF/AHA/AATS/ACR/ASA/SCA/SCAI/SIR/STS/SVM Guidelines for the Diagnosis and Management of Patients with Thoracic Aortic Disease. Circulation. 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 Multiple bilateral renal lesions likely representing cysts. Bilateral inguinal hernias containing fat.   Electronically Signed   By: Lucienne Capers  M.D.   On: 12/03/2014 23:15   Dg Chest 2 View  12/03/2014   CLINICAL DATA:  Fever and tachycardia  EXAM: CHEST  2 VIEW  COMPARISON:  None.  FINDINGS: The lungs are mildly hyperexpanded. There is scarring in the right base. There is mild central peribronchial thickening. There is a nipple shadow on the right. There is no edema or consolidation. The heart size is normal. The pulmonary vascularity is within normal limits. There is atherosclerotic change in aorta. There is no adenopathy. There is degenerative change in the thoracic spine.  IMPRESSION: Mild scarring right base. No edema or consolidation. Lungs mildly hyperexpanded with mild central  peribronchial thickening. Suspect a degree of chronic bronchitis.   Electronically Signed   By: Lowella Grip III M.D.   On: 12/03/2014 16:55   Dg Knee 1-2 Views Left  12/04/2014   CLINICAL DATA:  Medial left knee swelling at distal femur. Pain with weight-bearing. No injury.  EXAM: LEFT KNEE - 1-2 VIEW  COMPARISON:  None.  FINDINGS: Examination demonstrates moderate osteoarthritic change involving the lateral compartment and to a lesser extent involving the medial compartment and patellofemoral joints. No evidence of fracture or dislocation. Suggestion of a significant joint effusion. Mild calcified plaque over the femoral artery.  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 Significant joint effusion.   Electronically Signed   By: Marin Olp M.D.   On: 12/04/2014 13:41   Ct Chest Wo Contrast  12/03/2014   CLINICAL DATA:  Generalized fatigue and cough for a week. No appetite.  EXAM: CT CHEST, ABDOMEN AND PELVIS WITHOUT CONTRAST  TECHNIQUE: Multidetector CT imaging of the chest, abdomen and pelvis was performed following the standard protocol without IV contrast.  COMPARISON:  None.  FINDINGS: CT CHEST FINDINGS  Normal heart size. Coronary artery calcifications. 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 Aortic calcification. No definite lymphadenopathy in the chest although non conscious examination limits evaluation of hilar lymph nodes. Esophagus is decompressed.  Mild linear atelectasis or fibrosis in the lung bases. No airspace disease or consolidation. No parenchymal mass lesion identified. Mild emphysematous changes in the upper lungs. No pleural effusions. No pneumothorax.  CT ABDOMEN AND PELVIS FINDINGS  The unenhanced appearance of the liver, spleen, gallbladder, pancreas, adrenal glands, is unremarkable. Kidneys appear atrophic without hydronephrosis. Multiple bilateral renal mass lesions probably representing cysts. Calcification of  the aorta without aneurysm. There is extensive lymphadenopathy in the retroperitoneum involving predominantly the pericaval region and extending from the abdominal hiatus down into the upper pelvis. Conglomerate mass of lymph nodes in the pericaval region measures up to about 14.3 x 12.3 x 7.4 cm. Additional enlarged lymph nodes are also demonstrated in the periaortic regions. With the large lymph node mass displaces the aorta and partially surrounds the aorta. Inferior vena cava cannot be distinguished from the mass. This is likely to represent lymphoma although sarcoma or other retroperitoneal mass could also have this appearance. Stomach, small bowel, and colon are not abnormally distended. No free air or free fluid in the abdomen. Mild infiltration in the mesenteries probably reactive. Abdominal wall musculature appears intact.  Pelvis: Prostate gland is mildly enlarged. Bladder wall is not thickened. Appendix is not identified. No free or loculated pelvic fluid collections. Small bilateral inguinal hernias containing fat.  Bones: Diffuse degenerative changes throughout the thoracic and lumbar spine. No vertebral compression deformities. No destructive bone lesions. Degenerative changes in the hips. Sacrum and pelvis appear intact.  IMPRESSION: No  evidence of active pulmonary disease. 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 Recommend annual imaging followup by CTA or MRA. This recommendation follows 2010 ACCF/AHA/AATS/ACR/ASA/SCA/SCAI/SIR/STS/SVM Guidelines for the Diagnosis and Management of Patients with Thoracic Aortic Disease. Circulation. 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 Multiple bilateral renal lesions likely representing cysts. Bilateral inguinal hernias containing fat.   Electronically Signed   By: Lucienne Capers M.D.   On: 12/03/2014 23:15    Scheduled Meds: . calcitonin  380 Units Intramuscular Q12H  . heparin  5,000 Units  Subcutaneous 3 times per day  . insulin aspart  0-9 Units Subcutaneous 6 times per day  . insulin glargine  15 Units Subcutaneous QHS  . sodium chloride  3 mL Intravenous Q12H   Continuous Infusions: . sodium chloride 1,000 mL (12/04/14 0615)    Principal Problem:   Hypercalcemia Active Problems:   Acute on chronic kidney failure   HTN (hypertension)   DM2 (diabetes mellitus, type 2)   Retroperitoneal lymphadenopathy    Time spent: *25 minutes    [facility] Pager [phone]. If 7PM-7AM, please contact night-coverage at [email or web address], password [facility] 12/04/2014, 2:49 PM  LOS: 1 day

## 2014-12-04 NOTE — Consult Note (Signed)
[name] Admit Date: 12/03/2014 12/04/2014 Rexene Agent Requesting Physician:  Darrick Meigs MD  Reason for Consult:  Hypercalcemia, CKD4-5 HPI:  21N seen at request of Dr. Darrick Meigs for the evaluation of severe hypercalcemia and CKD 4.  Patient's pertinent past history includes hypertension, diabetes. He was recently seen at the [place] in [place], 09/2014. At that time he was told he had late stage chronic kidney disease, nearing dialysis. He does not recall his serum creatinine value. Resented to the emergency room yesterday with progressive fatigue, loss of appetite, and a 30 pound weight loss over the past 3 months. He denies night sweats or fevers. He was found to have a serum calcium value of 15, serum creatinine 5.44 and serum bicarbonate of 34. He has been treated with hydration and calcitonin. He takes comes on an intermittent basis, only 1 or 2 per day. He takes low-dose hydrochlorothiazide, 12.5 mg daily. He also takes 2000 international units of 25-OH vitamin D.  CT the abdomen and pelvis demonstrated extensive retroperitoneal lymphadenopathy concerning for lymphoma. PTH, PTH RP have been drawn and are pending. Phosphorus value was 4.9. He is currently hydrating with 150 mL of normal saline per hour.  CREATININE, SER (mg/dL)  Date Value  12/04/2014 5.17*  12/04/2014 5.43*  12/03/2014 5.44*  12/03/2014 5.80*  ] I/Os:  ROS Balance of 12 systems is negative w/ exceptions as above  PMH  Past Medical History  Diagnosis Date  . COPD (chronic obstructive pulmonary disease)   . Hypertension   . Diabetes mellitus without complication   . Renal disorder    PSH History reviewed. No pertinent past surgical history. FH History reviewed. No pertinent family history. SH  reports that he has never smoked. He does not have any smokeless tobacco history on file. His alcohol and drug histories are not on file. Allergies No Known Allergies Home medications Prior to Admission medications    Medication Sig Start Date End Date Taking? Authorizing Provider  acetaminophen (TYLENOL) 500 MG tablet Take 500 mg by mouth every 6 (six) hours as needed for moderate pain.   Yes Historical Provider, MD  albuterol (PROVENTIL HFA;VENTOLIN HFA) 108 (90 BASE) MCG/ACT inhaler Inhale 2 puffs into the lungs every 6 (six) hours as needed for wheezing or shortness of breath.   Yes Historical Provider, MD  allopurinol (ZYLOPRIM) 300 MG tablet Take 150 mg by mouth daily.   Yes Historical Provider, MD  budesonide-formoterol (SYMBICORT) 160-4.5 MCG/ACT inhaler Inhale 2 puffs into the lungs every evening.   Yes Historical Provider, MD  glipiZIDE (GLUCOTROL) 10 MG tablet Take 10 mg by mouth daily before breakfast.   Yes Historical Provider, MD  insulin glargine (LANTUS) 100 UNIT/ML injection Inject 30 Units into the skin at bedtime.   Yes Historical Provider, MD  pravastatin (PRAVACHOL) 40 MG tablet Take 40 mg by mouth every evening.   Yes Historical Provider, MD  traZODone (DESYREL) 50 MG tablet Take 50 mg by mouth at bedtime.   Yes Historical Provider, MD  zolpidem (AMBIEN) 10 MG tablet Take 10 mg by mouth at bedtime.   Yes Historical Provider, MD  lisinopril-hydrochlorothiazide (PRINZIDE,ZESTORETIC) 20-12.5 MG per tablet Take 1 tablet by mouth daily.    Historical Provider, MD    Current Medications Scheduled Meds: . calcitonin  380 Units Intramuscular Q12H  . heparin  5,000 Units Subcutaneous 3 times per day  . insulin aspart  0-9 Units Subcutaneous 6 times per day  . insulin glargine  15 Units Subcutaneous QHS  .  sodium chloride  3 mL Intravenous Q12H   Continuous Infusions: . sodium chloride 1,000 mL (12/04/14 0615)   PRN Meds:.  CBC  Recent Labs Lab 12/03/14 1631  WBC 9.1  HGB 14.0  HCT 41.3  MCV 89.0  PLT 169   Basic Metabolic Panel  Recent Labs Lab 12/03/14 1631 12/03/14 2104 12/03/14 2255 12/04/14 0338 12/04/14 1110  NA 133*  --  138 135 137  K 4.3  --  3.9 3.9 4.1  CL  91*  --  94* 95* 99*  CO2 31  --  34* 29 29  GLUCOSE 92  --  74 116* 87  BUN 62*  --  59* 60* 62*  CREATININE 5.80*  --  5.44* 5.43* 5.17*  CALCIUM >15.0*  --  >15.0* 14.5* 13.8*  PHOS  --  4.9*  --   --   --     Physical Exam  Blood pressure 103/64, pulse 82, temperature 97.5 F (36.4 C), temperature source Oral, resp. rate 21, height 5\' 8"  (1.727 m), weight 95.255 kg (210 lb), SpO2 99 %. GEN: NAD, obese ENT: NCAT EYES: EOMI LYMPH: no palpable supraclavicular, cervical. Axillary, or femoral nodes CV: RRR. No rub PULM: CTAB ABD: protuberant, s/nt SKIN: no rashes/lesions CVE:LFYBO b/l LEE   Assessment/Plan 12M with severe hypercalcemia, CKD4-5? With possible AKI, CT demonstrated retroperitoneal lymphadenopathy concerning for lymphoma.    1. Hypercalcemia, Severe 1. Agree with NS hydration and Calcitonin 2. Tentatively to give pamidronate tomorrow once clearly euvolemic 3. Monitor closely with volume resuscitation and low GFR -- stop time 0700 6/16 and reassess 4. Etiology 1. Likely driven by 1,75 OH excess related to lymphoma 2. Exclude monoclonal process with SPEP and sFLC 3. PTH and PTHrP pending, expect both to be low 4. No strong history to suggest milk alkali syndrome 5. 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 Unsure of baseline, sees [facility] [place], told 'nearing dialysis' 2. Some improvement in SCr with hydration 3. Cont to follow closely 4. Other electrolytes ok 5. 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 Retroperitoneal LAN 1. 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 IR to eval for core biopsy 3. TRH discussed with Heme-Onc 4. DM2 5. HTN 1. Hold Thiazide and ACEi 2. Follow 3. CCB likely next agent if needed   Pearson Grippe MD [phone] pgr 12/04/2014, 12:54 PM

## 2014-12-05 ENCOUNTER — Inpatient Hospital Stay (HOSPITAL_COMMUNITY): Payer: Medicare Other

## 2014-12-05 DIAGNOSIS — R590 Localized enlarged lymph nodes: Secondary | ICD-10-CM

## 2014-12-05 LAB — PROTEIN ELECTROPHORESIS, SERUM
A/G Ratio: 1 (ref 0.7–1.7)
ALBUMIN ELP: 3.1 g/dL (ref 2.9–4.4)
ALPHA-1-GLOBULIN: 0.3 g/dL (ref 0.0–0.4)
ALPHA-2-GLOBULIN: 0.9 g/dL (ref 0.4–1.0)
BETA GLOBULIN: 1 g/dL (ref 0.7–1.3)
GAMMA GLOBULIN: 0.8 g/dL (ref 0.4–1.8)
Globulin, Total: 3 g/dL (ref 2.2–3.9)
TOTAL PROTEIN ELP: 6.1 g/dL (ref 6.0–8.5)

## 2014-12-05 LAB — GLUCOSE, CAPILLARY
GLUCOSE-CAPILLARY: 175 mg/dL — AB (ref 65–99)
GLUCOSE-CAPILLARY: 201 mg/dL — AB (ref 65–99)
GLUCOSE-CAPILLARY: 70 mg/dL (ref 65–99)
Glucose-Capillary: 95 mg/dL (ref 65–99)
Glucose-Capillary: 76 mg/dL (ref 65–99)

## 2014-12-05 LAB — COMPREHENSIVE METABOLIC PANEL
ALT: 16 U/L — ABNORMAL LOW (ref 17–63)
AST: 24 U/L (ref 15–41)
Albumin: 2.9 g/dL — ABNORMAL LOW (ref 3.5–5.0)
Alkaline Phosphatase: 50 U/L (ref 38–126)
Anion gap: 7 (ref 5–15)
BUN: 57 mg/dL — ABNORMAL HIGH (ref 6–20)
CALCIUM: 12.9 mg/dL — AB (ref 8.9–10.3)
CO2: 27 mmol/L (ref 22–32)
Chloride: 105 mmol/L (ref 101–111)
Creatinine, Ser: 4.56 mg/dL — ABNORMAL HIGH (ref 0.61–1.24)
GFR calc Af Amer: 14 mL/min — ABNORMAL LOW (ref >60)
GFR calc non Af Amer: 12 mL/min — ABNORMAL LOW (ref >60)
Glucose, Bld: 78 mg/dL (ref 65–99)
Potassium: 4 mmol/L (ref 3.5–5.1)
SODIUM: 139 mmol/L (ref 135–145)
TOTAL PROTEIN: 5.8 g/dL — AB (ref 6.5–8.1)
Total Bilirubin: 0.4 mg/dL (ref 0.3–1.2)

## 2014-12-05 LAB — APTT: APTT: 31 s (ref 24–37)

## 2014-12-05 LAB — PROTIME-INR
INR: 1.19 (ref 0.00–1.49)
Prothrombin Time: 15.3 seconds — ABNORMAL HIGH (ref 11.6–15.2)

## 2014-12-05 LAB — KAPPA/LAMBDA LIGHT CHAINS
KAPPA FREE LGHT CHN: 74.06 mg/L — AB (ref 3.30–19.40)
Kappa, lambda light chain ratio: 1.7 — ABNORMAL HIGH (ref 0.26–1.65)
Lambda free light chains: 43.45 mg/L — ABNORMAL HIGH (ref 5.71–26.30)

## 2014-12-05 MED ORDER — SODIUM CHLORIDE 0.9 % IV SOLN
INTRAVENOUS | Status: DC
  Administered 2014-12-05: 75 mL via INTRAVENOUS
  Administered 2014-12-05: via INTRAVENOUS
  Administered 2014-12-06: 75 mL/h via INTRAVENOUS

## 2014-12-05 MED ORDER — HYDROCODONE-ACETAMINOPHEN 5-325 MG PO TABS
1.0000 | ORAL_TABLET | ORAL | Status: DC | PRN
  Administered 2014-12-05: 2 via ORAL
  Administered 2014-12-07: 1 via ORAL
  Filled 2014-12-05: qty 2
  Filled 2014-12-05: qty 1

## 2014-12-05 MED ORDER — FENTANYL CITRATE (PF) 100 MCG/2ML IJ SOLN
INTRAMUSCULAR | Status: AC | PRN
  Administered 2014-12-05: 50 ug via INTRAVENOUS

## 2014-12-05 MED ORDER — SODIUM CHLORIDE 0.9 % IV SOLN: INTRAVENOUS | Status: DC

## 2014-12-05 MED ORDER — LIDOCAINE HCL 1 % IJ SOLN
INTRAMUSCULAR | Status: AC
  Filled 2014-12-05: qty 20

## 2014-12-05 MED ORDER — MIDAZOLAM HCL 2 MG/2ML IJ SOLN
INTRAMUSCULAR | Status: AC | PRN
  Administered 2014-12-05 (×2): 1 mg via INTRAVENOUS

## 2014-12-05 MED ORDER — MIDAZOLAM HCL 2 MG/2ML IJ SOLN
INTRAMUSCULAR | Status: AC
  Filled 2014-12-05: qty 2

## 2014-12-05 MED ORDER — FENTANYL CITRATE (PF) 100 MCG/2ML IJ SOLN
INTRAMUSCULAR | Status: AC
  Filled 2014-12-05: qty 2

## 2014-12-05 MED ORDER — PAMIDRONATE DISODIUM 90 MG/10ML IV SOLN
90.0000 mg | Freq: Once | INTRAVENOUS | Status: AC
  Administered 2014-12-05: 90 mg via INTRAVENOUS
  Filled 2014-12-05: qty 10

## 2014-12-05 NOTE — Procedures (Signed)
CT core biopsy R retroperitoneal mass, 18g x6 to surg path No complication No blood loss. See complete dictation in [facility].

## 2014-12-05 NOTE — Progress Notes (Signed)
CRITICAL VALUE ALERT  Critical value received:  Serum calcium 15.0  Date of notification:  12/05/14  Time of notification:  8333  Critical value read back:Yes.    Nurse who received alert:  Shelbie Hutching  MD notified (1st page):  Raliegh Ip Schorr  Time of first page:  [phone]  MD notified (2nd page):  Time of second page:  Responding MD:  Lamar Blinks  Time MD responded:  [phone]

## 2014-12-05 NOTE — Progress Notes (Signed)
Admit: 12/03/2014 LOS: 2  49M with severe hypercalcemia, CKD4-5? With possible AKI, CT demonstrated retroperitoneal lymphadenopathy concerning for lymphoma.   Subjective:  Ca improved to 12.9 this AM No SOB, LEE For Bx with IR this AM No co, poor PO intake  PTH suppressed     Filed Weights   12/03/14 1900 12/04/14 2025  Weight: 95.255 kg (210 lb) 98.2 kg (216 lb 7.9 oz)    Scheduled Meds: . calcitonin  380 Units Intramuscular Q12H  . heparin  5,000 Units Subcutaneous 3 times per day  . insulin aspart  0-9 Units Subcutaneous 6 times per day  . insulin glargine  15 Units Subcutaneous QHS  . pamidronate  90 mg Intravenous Once  . sodium chloride  3 mL Intravenous Q12H   Continuous Infusions:  PRN Meds:.zolpidem  Current Labs: reviewed  PTH suppressed at 15 1,25 Vit D pending SPEP pending sFLC Pending PTH rP pending   Physical Exam:  Blood pressure 117/69, pulse 87, temperature 98.2 F (36.8 C), temperature source Oral, resp. rate 20, height 5\' 8"  (1.727 m), weight 98.2 kg (216 lb 7.9 oz), SpO2 95 %. GEN: NAD, obese ENT: NCAT EYES: EOMI LYMPH: no palpable supraclavicular, cervical. Axillary, or femoral nodes CV: RRR. No rub PULM: CTAB ABD: protuberant, s/nt SKIN: no rashes/lesions XIP:JASNK b/l LEE  A/P 1. Hypercalcemia, Severe 1. Pamidronate 90mg  IV today 2. Cont Calcitonin for another 24h 3. Restart NS IVF @ 59mL/hr x24h 4. Follow volume status closely 5. Etiology 1. Likely driven by 5,39 OH excess related to lymphoma 2. Exclude monoclonal process with SPEP and sFLC 3. PTH suppressed and PTHrP pending, expect both to be low 4. No strong history to suggest milk alkali syndrome 5. 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 Unsure of baseline, sees [facility] [place], told 'nearing dialysis' 2. Some improvement in SCr with hydration 3. Cont to follow closely 4. Other electrolytes ok 5. Numerous cysts on CT, suspect acquired cystic  disease 6. Daily weights, Daily Renal Panel, Strict I/Os, Avoid nephrotoxins (NSAIDs, judicious IV Contrast) 3. Extensive Retroperitoneal LAN 1. 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 IR to eval for core biopsy today 3. TRH discussed with Heme-Onc 4. DM2 5. HTN 1. Hold Thiazide and ACEi 2. Follow 3. CCB likely next agent if needed  Pearson Grippe MD 12/05/2014, 9:30 AM   Recent Labs Lab 12/03/14 2104  12/04/14 1110 12/04/14 1652 12/04/14 2303  NA  --   < > 137 137 137  K  --   < > 4.1 4.0 4.1  CL  --   < > 99* 98* 102  CO2  --   < > 29 30 27   GLUCOSE  --   < > 87 83 118*  BUN  --   < > 62* 61* 61*  CREATININE  --   < > 5.17* 5.09* 4.80*  CALCIUM 15.0*  < > 13.8* 13.6* 12.9*  PHOS 4.9*  --   --   --   --   < > = values in this interval not displayed.  Recent Labs Lab 12/03/14 1631  WBC 9.1  HGB 14.0  HCT 41.3  MCV 89.0  PLT 282

## 2014-12-05 NOTE — Progress Notes (Signed)
[facility] PROGRESS NOTE  Billy Gray OVF:[id number] DOB: 05-Jan-1946 DOA: 12/03/2014 PCP: Pcp Not In System  Assessment/Plan: 1. Hypercalcemia- patient came with calcium of 15.0, this morning calcium is down to 12.9 after started IV hydration with normal saline. Nephrology has been consulted, IV pamidronate will be started as per nephrology. Patient also started on calcitonin. We will follow CMP in a.m. 2. Malignancy- CT abdomen reveals retroperitoneal lymphadenopathy, Conglomerate mass of lymph nodes in the pericaval region measures up to about 14.3 x 12.3 x 7.4 cm. Additional enlarged lymph nodes are also demonstrated in the periaortic regions. Called and discussed with hematology/oncology on call Dr. Alen Blew, who recommends getting the core biopsy per IR. Plan for biopsy from the retroperitoneal lymph nodes today per IR. 3. Left knee swelling- x-ray of left knee reveals significant joint effusion. A shunt says that he had arthrocentesis performed 2 weeks ago at [facility]. Will consult orthopedics after the lymph node biopsy for arthrocentesis. 4. CKD stage IV- patient is currently not on hemodialysis, was told by [facility] [place] that he is nearing dialysis. Nephrology has been consulted. HCTZ and ACE inhibitor have been held per nephrology. 5. Diabetes mellitus- continue sliding scale insulin, Lantus. Blood glucose is well controlled. 6. Thoracic aortic aneurysm- stable ,CT chest shows 4.1 cm of ascending thoracic aortic aneurysm, patient will need yearly CT chest to assess the growth. I discussed with patient and his wife at bedside. 7. Hypertension- patient is currently hypotensive, both HCTZ as well as ACE inhibitor are on hold.  Code Status: Full code Family Communication: Discussed with patient's wife at bedside Disposition Plan: Home when stable   Consultants:  Nephrology  Procedures:  None  Antibiotics:  None  HPI/Subjective: 69 y.o. male who presents to the ED  with c/o fatigue, generalized weakness, cough, decreased PO intake for the past 2 weeks or so. Taking small amount of tums or peptobismol recently. Has h/o kidney problems, diagnosed 3 months ago with CKD at the [place] [facility] and told his "creatinine was near needing dialysis". Chronic cough, on 3L o2 at home for COPD. Was also diagnosed with renal cyst at the [place] but unsure which side this was on.  This morning patient is npo for the lymph node biopsy.   Objective: Filed Vitals:   12/05/14 1201  BP: 115/62  Pulse: 84  Temp:   Resp: 17    Intake/Output Summary (Last 24 hours) at 12/05/14 1723 Last data filed at 12/05/14 1400  Gross per 24 hour  Intake   1335 ml  Output    350 ml  Net    985 ml   Filed Weights   12/03/14 1900 12/04/14 2025  Weight: 95.255 kg (210 lb) 98.2 kg (216 lb 7.9 oz)    Exam:   General:  Appears in no acute distress  Cardiovascular: S1-S2 is regular, no murmurs rubs or gallops  Respiratory: Clear to auscultation bilaterally  Abdomen: Soft, nontender, no organomegaly  Musculoskeletal: Knee brace in the left leg, with swelling noted in the left knee   Data Reviewed: Basic Metabolic Panel:  Recent Labs Lab 12/03/14 2104  12/04/14 0338 12/04/14 1110 12/04/14 1652 12/04/14 2303 12/05/14 1042  NA  --   < > 135 137 137 137 139  K  --   < > 3.9 4.1 4.0 4.1 4.0  CL  --   < > 95* 99* 98* 102 105  CO2  --   < > 29 29 30 27 27   GLUCOSE  --   < >  116* 87 83 118* 78  BUN  --   < > 60* 62* 61* 61* 57*  CREATININE  --   < > 5.43* 5.17* 5.09* 4.80* 4.56*  CALCIUM 15.0*  < > 14.5* 13.8* 13.6* 12.9* 12.9*  MG 1.5*  --   --   --   --   --   --   PHOS 4.9*  --   --   --   --   --   --   < > = values in this interval not displayed. Liver Function Tests:  Recent Labs Lab 12/03/14 1631 12/05/14 1042  AST 23 24  ALT 17 16*  ALKPHOS 57 50  BILITOT 0.5 0.4  PROT 7.2 5.8*  ALBUMIN 3.7 2.9*   No results for input(s): LIPASE, AMYLASE in the last 168  hours. No results for input(s): AMMONIA in the last 168 hours. CBC:  Recent Labs Lab 12/03/14 1631  WBC 9.1  HGB 14.0  HCT 41.3  MCV 89.0  PLT 282   Cardiac Enzymes: No results for input(s): CKTOTAL, CKMB, CKMBINDEX, TROPONINI in the last 168 hours. BNP (last 3 results)  Recent Labs  12/03/14 1631  BNP 102.8*    ProBNP (last 3 results) No results for input(s): PROBNP in the last 8760 hours.  CBG:  Recent Labs Lab 12/04/14 2035 12/04/14 2356 12/05/14 0417 12/05/14 0757 12/05/14 1646  GLUCAP 81 101* 95 76 201*    No results found for this or any previous visit (from the past 240 hour(s)).   Studies: Ct Abdomen Pelvis Wo Contrast  12/03/2014   CLINICAL DATA:  Generalized fatigue and cough for a week. No appetite.  EXAM: CT CHEST, ABDOMEN AND PELVIS WITHOUT CONTRAST  TECHNIQUE: Multidetector CT imaging of the chest, abdomen and pelvis was performed following the standard protocol without IV contrast.  COMPARISON:  None.  FINDINGS: CT CHEST FINDINGS  Normal heart size. Coronary artery calcifications. 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 Aortic calcification. No definite lymphadenopathy in the chest although non conscious examination limits evaluation of hilar lymph nodes. Esophagus is decompressed.  Mild linear atelectasis or fibrosis in the lung bases. No airspace disease or consolidation. No parenchymal mass lesion identified. Mild emphysematous changes in the upper lungs. No pleural effusions. No pneumothorax.  CT ABDOMEN AND PELVIS FINDINGS  The unenhanced appearance of the liver, spleen, gallbladder, pancreas, adrenal glands, is unremarkable. Kidneys appear atrophic without hydronephrosis. Multiple bilateral renal mass lesions probably representing cysts. Calcification of the aorta without aneurysm. There is extensive lymphadenopathy in the retroperitoneum involving predominantly the pericaval region and extending from the abdominal hiatus down into the upper pelvis.  Conglomerate mass of lymph nodes in the pericaval region measures up to about 14.3 x 12.3 x 7.4 cm. Additional enlarged lymph nodes are also demonstrated in the periaortic regions. With the large lymph node mass displaces the aorta and partially surrounds the aorta. Inferior vena cava cannot be distinguished from the mass. This is likely to represent lymphoma although sarcoma or other retroperitoneal mass could also have this appearance. Stomach, small bowel, and colon are not abnormally distended. No free air or free fluid in the abdomen. Mild infiltration in the mesenteries probably reactive. Abdominal wall musculature appears intact.  Pelvis: Prostate gland is mildly enlarged. Bladder wall is not thickened. Appendix is not identified. No free or loculated pelvic fluid collections. Small bilateral inguinal hernias containing fat.  Bones: Diffuse degenerative changes throughout the thoracic and lumbar spine. No vertebral compression deformities. No destructive  bone lesions. Degenerative changes in the hips. Sacrum and pelvis appear intact.  IMPRESSION: No evidence of active pulmonary disease. 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 Recommend annual imaging followup by CTA or MRA. This recommendation follows 2010 ACCF/AHA/AATS/ACR/ASA/SCA/SCAI/SIR/STS/SVM Guidelines for the Diagnosis and Management of Patients with Thoracic Aortic Disease. Circulation. 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 Multiple bilateral renal lesions likely representing cysts. Bilateral inguinal hernias containing fat.   Electronically Signed   By: Lucienne Capers M.D.   On: 12/03/2014 23:15   Dg Chest 2 View  12/03/2014   CLINICAL DATA:  Fever and tachycardia  EXAM: CHEST  2 VIEW  COMPARISON:  None.  FINDINGS: The lungs are mildly hyperexpanded. There is scarring in the right base. There is mild central peribronchial thickening. There is a nipple shadow on the right. There is  no edema or consolidation. The heart size is normal. The pulmonary vascularity is within normal limits. There is atherosclerotic change in aorta. There is no adenopathy. There is degenerative change in the thoracic spine.  IMPRESSION: Mild scarring right base. No edema or consolidation. Lungs mildly hyperexpanded with mild central peribronchial thickening. Suspect a degree of chronic bronchitis.   Electronically Signed   By: Lowella Grip III M.D.   On: 12/03/2014 16:55   Dg Knee 1-2 Views Left  12/04/2014   CLINICAL DATA:  Medial left knee swelling at distal femur. Pain with weight-bearing. No injury.  EXAM: LEFT KNEE - 1-2 VIEW  COMPARISON:  None.  FINDINGS: Examination demonstrates moderate osteoarthritic change involving the lateral compartment and to a lesser extent involving the medial compartment and patellofemoral joints. No evidence of fracture or dislocation. Suggestion of a significant joint effusion. Mild calcified plaque over the femoral artery.  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 Significant joint effusion.   Electronically Signed   By: Marin Olp M.D.   On: 12/04/2014 13:41   Ct Chest Wo Contrast  12/03/2014   CLINICAL DATA:  Generalized fatigue and cough for a week. No appetite.  EXAM: CT CHEST, ABDOMEN AND PELVIS WITHOUT CONTRAST  TECHNIQUE: Multidetector CT imaging of the chest, abdomen and pelvis was performed following the standard protocol without IV contrast.  COMPARISON:  None.  FINDINGS: CT CHEST FINDINGS  Normal heart size. Coronary artery calcifications. 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 Aortic calcification. No definite lymphadenopathy in the chest although non conscious examination limits evaluation of hilar lymph nodes. Esophagus is decompressed.  Mild linear atelectasis or fibrosis in the lung bases. No airspace disease or consolidation. No parenchymal mass lesion identified. Mild emphysematous  changes in the upper lungs. No pleural effusions. No pneumothorax.  CT ABDOMEN AND PELVIS FINDINGS  The unenhanced appearance of the liver, spleen, gallbladder, pancreas, adrenal glands, is unremarkable. Kidneys appear atrophic without hydronephrosis. Multiple bilateral renal mass lesions probably representing cysts. Calcification of the aorta without aneurysm. There is extensive lymphadenopathy in the retroperitoneum involving predominantly the pericaval region and extending from the abdominal hiatus down into the upper pelvis. Conglomerate mass of lymph nodes in the pericaval region measures up to about 14.3 x 12.3 x 7.4 cm. Additional enlarged lymph nodes are also demonstrated in the periaortic regions. With the large lymph node mass displaces the aorta and partially surrounds the aorta. Inferior vena cava cannot be distinguished from the mass. This is likely to represent lymphoma although sarcoma or other retroperitoneal mass could also have this appearance. Stomach, small bowel, and  colon are not abnormally distended. No free air or free fluid in the abdomen. Mild infiltration in the mesenteries probably reactive. Abdominal wall musculature appears intact.  Pelvis: Prostate gland is mildly enlarged. Bladder wall is not thickened. Appendix is not identified. No free or loculated pelvic fluid collections. Small bilateral inguinal hernias containing fat.  Bones: Diffuse degenerative changes throughout the thoracic and lumbar spine. No vertebral compression deformities. No destructive bone lesions. Degenerative changes in the hips. Sacrum and pelvis appear intact.  IMPRESSION: No evidence of active pulmonary disease. 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 Recommend annual imaging followup by CTA or MRA. This recommendation follows 2010 ACCF/AHA/AATS/ACR/ASA/SCA/SCAI/SIR/STS/SVM Guidelines for the Diagnosis and Management of Patients with Thoracic Aortic Disease. Circulation. 2010; 121: X323-F573  Prominent  retroperitoneal lymphadenopathy most likely to represent lymphoma. Multiple bilateral renal lesions likely representing cysts. Bilateral inguinal hernias containing fat.   Electronically Signed   By: Lucienne Capers M.D.   On: 12/03/2014 23:15    Scheduled Meds: . calcitonin  380 Units Intramuscular Q12H  . fentaNYL      . heparin  5,000 Units Subcutaneous 3 times per day  . insulin aspart  0-9 Units Subcutaneous 6 times per day  . insulin glargine  15 Units Subcutaneous QHS  . lidocaine      . midazolam      . sodium chloride  3 mL Intravenous Q12H   Continuous Infusions: . sodium chloride 75 mL (12/05/14 1100)    Principal Problem:   Hypercalcemia Active Problems:   Acute on chronic kidney failure   HTN (hypertension)   DM2 (diabetes mellitus, type 2)   Retroperitoneal lymphadenopathy    Time spent: *25 minutes    [facility] Pager [phone]. If 7PM-7AM, please contact night-coverage at [email or web address], password [email or web address] 12/05/2014, 5:23 PM  LOS: 2 days

## 2014-12-06 LAB — COMPREHENSIVE METABOLIC PANEL
ALBUMIN: 2.8 g/dL — AB (ref 3.5–5.0)
ALT: 15 U/L — ABNORMAL LOW (ref 17–63)
ANION GAP: 7 (ref 5–15)
AST: 20 U/L (ref 15–41)
Alkaline Phosphatase: 50 U/L (ref 38–126)
BILIRUBIN TOTAL: 0.5 mg/dL (ref 0.3–1.2)
BUN: 51 mg/dL — AB (ref 6–20)
CALCIUM: 11.9 mg/dL — AB (ref 8.9–10.3)
CO2: 26 mmol/L (ref 22–32)
CREATININE: 4.1 mg/dL — AB (ref 0.61–1.24)
Chloride: 105 mmol/L (ref 101–111)
GFR, EST AFRICAN AMERICAN: 16 mL/min — AB (ref >60)
GFR, EST NON AFRICAN AMERICAN: 14 mL/min — AB (ref >60)
Glucose, Bld: 95 mg/dL (ref 65–99)
Potassium: 3.9 mmol/L (ref 3.5–5.1)
Sodium: 138 mmol/L (ref 135–145)
TOTAL PROTEIN: 5.6 g/dL — AB (ref 6.5–8.1)

## 2014-12-06 LAB — GLUCOSE, CAPILLARY
GLUCOSE-CAPILLARY: 92 mg/dL (ref 65–99)
GLUCOSE-CAPILLARY: 82 mg/dL (ref 65–99)
GLUCOSE-CAPILLARY: 149 mg/dL — AB (ref 65–99)
GLUCOSE-CAPILLARY: 170 mg/dL — AB (ref 65–99)
Glucose-Capillary: 110 mg/dL — ABNORMAL HIGH (ref 65–99)
Glucose-Capillary: 121 mg/dL — ABNORMAL HIGH (ref 65–99)

## 2014-12-06 NOTE — Progress Notes (Signed)
Admit: 12/03/2014 LOS: 3  52M with severe hypercalcemia, CKD4-5? With possible AKI, CT demonstrated retroperitoneal lymphadenopathy concerning for lymphoma.   Subjective:  Successful core biopsy of retroperitoneal mass yesterday Ca down to 11.9 No co this AM, denies PND or orthopnea Inc LEE this AM Rec pamidronate yesterday  06/16 0701 - 06/17 0700 In: 5701 [P.O.:600; I.V.:225; IV Piggyback:510] Out: 1100 [Urine:1100]  Filed Weights   12/03/14 1900 12/04/14 2025 12/05/14 1950  Weight: 95.255 kg (210 lb) 98.2 kg (216 lb 7.9 oz) 99.7 kg (219 lb 12.8 oz)    Scheduled Meds: . calcitonin  380 Units Intramuscular Q12H  . heparin  5,000 Units Subcutaneous 3 times per day  . insulin aspart  0-9 Units Subcutaneous 6 times per day  . insulin glargine  15 Units Subcutaneous QHS  . sodium chloride  3 mL Intravenous Q12H   Continuous Infusions: . sodium chloride 75 mL/hr (12/06/14 0630)   PRN Meds:.HYDROcodone-acetaminophen, zolpidem  Current Labs: reviewed  PTH suppressed at 15 1,25 Vit D pending SPEP w/o M Spike sFLC consistent with CKD, K:L 1.70 PTH rP pending   Physical Exam:  Blood pressure 115/52, pulse 84, temperature 98.1 F (36.7 C), temperature source Oral, resp. rate 16, height 5\' 8"  (1.727 m), weight 99.7 kg (219 lb 12.8 oz), SpO2 97 %. GEN: NAD, obese ENT: NCAT EYES: EOMI LYMPH: no palpable supraclavicular, cervical. Axillary, or femoral nodes CV: RRR. No rub PULM: CTAB ABD: protuberant, s/nt SKIN: no rashes/lesions XBL:TJQZE b/l LEE  A/P 1. Hypercalcemia, Severe, admit at > 15.0 1. Rec Pamidronate 90mg  IV 6/16 2. Stop calcitonin 6/17 after 72h (last dose this PM) 3. Hold on further IVF, weight up ?9lb 4. Follow volume status closely -- diuretics as needed 5. Etiology 1. Likely driven by 0,92 OH excess related to lymphoma, level pending 2. Excluded monoclonal process with negative SPEP and sFLC 3. PTH suppressed and PTHrP pending, expect both to be  low 4. No strong history to suggest milk alkali syndrome 5. 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 Unsure of baseline, sees [place] [facility], told 'nearing dialysis' 2. Further improvement in SCr with hydration 3. Cont to follow closely  4. Other electrolytes ok 5. 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 Daily weights, Daily Renal Panel, Strict I/Os, Avoid nephrotoxins (NSAIDs, judicious IV Contrast) 3. Extensive Retroperitoneal LAN 1. 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 S/p core Bx 12/05/14 3. TRH discussed with Heme-Onc, await pathology 4. DM2 5. HTN 1. Hold Thiazide and ACEi 2. Follow 3. CCB likely next agent if needed  Pearson Grippe MD 12/06/2014, 8:57 AM   Recent Labs Lab 12/03/14 2104  12/04/14 2303 12/05/14 1042 12/06/14 0437  NA  --   < > 137 139 138  K  --   < > 4.1 4.0 3.9  CL  --   < > 102 105 105  CO2  --   < > 27 27 26   GLUCOSE  --   < > 118* 78 95  BUN  --   < > 61* 57* 51*  CREATININE  --   < > 4.80* 4.56* 4.10*  CALCIUM 15.0*  < > 12.9* 12.9* 11.9*  PHOS 4.9*  --   --   --   --   < > = values in this interval not displayed.  Recent Labs Lab 12/03/14 1631  WBC 9.1  HGB 14.0  HCT 41.3  MCV 89.0  PLT 282

## 2014-12-06 NOTE — Care Management Note (Signed)
Case Management Note  Patient Details  Name: Billy Gray MRN: [id number] Date of Birth: [birth year]  Subjective/Objective:         CM following for progression and d/c planning.           Action/Plan: Met with pt , no d/c needs identified.   Expected Discharge Date:  12/06/2014            Expected Discharge Plan:  Home/Self Care  In-House Referral:  NA  Discharge planning Services  NA  Post Acute Care Choice:  NA Choice offered to:  NA  DME Arranged:    DME Agency:     HH Arranged:    HH Agency:     Status of Service:  Completed, signed off  Medicare Important Message Given:  Yes Date Medicare IM Given:  12/06/14 Medicare IM give by:  CRoyal RN MPH, case manager, [phone] Date Additional Medicare IM Given:    Additional Medicare Important Message give by:     If discussed at [place] of Stay Meetings, dates discussed:    Additional Comments:  Adron Bene, RN 12/06/2014, 3:50 PM

## 2014-12-06 NOTE — Progress Notes (Signed)
[facility] PROGRESS NOTE  Billy Gray PYK:[id number] DOB: 12/27/1945 DOA: 12/03/2014 PCP: Pcp Not In System  Assessment/Plan: 1. Hypercalcemia- patient came with calcium of 15.0, this morning calcium is down to 11.9, after started IV hydration with normal saline. Nephrology has been consulted, IV pamidronate will be started as per nephrology. Follow CMP in am. 2. Malignancy- CT abdomen reveals retroperitoneal lymphadenopathy, Conglomerate mass of lymph nodes in the pericaval region measures up to about 14.3 x 12.3 x 7.4 cm. Additional enlarged lymph nodes are also demonstrated in the periaortic regions. Called and discussed with hematology/oncology on call Dr. Alen Blew, who recommends getting the core biopsy per IR.  Biopsy done from the retroperitoneal lymph nodes today per IR.Await the biopsy report.  3. Left knee swelling- improved, x-ray of left knee reveals significant joint effusion. Patient had arthrocentesis 2 weeks ago at [facility], will follow [facility] as outpatient 4. CKD stage IV- patient is currently not on hemodialysis, was told by [facility] [place] that he is nearing dialysis. Nephrology has been consulted. HCTZ and ACE inhibitor have been held per nephrology. Creatinine today is 4.10 5. Diabetes mellitus- continue sliding scale insulin, Lantus. Blood glucose is well controlled.53,97,673 6. Thoracic aortic aneurysm- stable ,CT chest shows 4.1 cm of ascending thoracic aortic aneurysm, patient will need yearly CT chest to assess the growth. I discussed with patient and his wife at bedside. 7. Hypertension- patient is currently hypotensive, both HCTZ as well as ACE inhibitor are on hold.  Code Status: Full code Family Communication: Discussed with patient's wife at bedside Disposition Plan: Home when stable   Consultants:  Nephrology  Procedures:  None  Antibiotics:  None  HPI/Subjective: 69 y.o. male who presents to the ED with c/o fatigue, generalized weakness, cough,  decreased PO intake for the past 2 weeks or so. Taking small amount of tums or peptobismol recently. Has h/o kidney problems, diagnosed 3 months ago with CKD at the [place] [facility] and told his "creatinine was near needing dialysis". Chronic cough, on 3L o2 at home for COPD. Was also diagnosed with renal cyst at the [place] but unsure which side this was on.  This morning patient denies pain, no nausea, vomiting.   Objective: Filed Vitals:   12/06/14 1000  BP: 143/70  Pulse: 82  Temp: 98.2 F (36.8 C)  Resp: 18    Intake/Output Summary (Last 24 hours) at 12/06/14 1436 Last data filed at 12/06/14 0900  Gross per 24 hour  Intake    240 ml  Output   1100 ml  Net   -860 ml   Filed Weights   12/03/14 1900 12/04/14 2025 12/05/14 1950  Weight: 95.255 kg (210 lb) 98.2 kg (216 lb 7.9 oz) 99.7 kg (219 lb 12.8 oz)    Exam:   General:  Appears in no acute distress  Cardiovascular: S1-S2 is regular, no murmurs rubs or gallops  Respiratory: Clear to auscultation bilaterally  Abdomen: Soft, nontender, no organomegaly  Musculoskeletal: Knee brace in the left leg, with swelling noted in the left knee   Data Reviewed: Basic Metabolic Panel:  Recent Labs Lab 12/03/14 2104  12/04/14 1110 12/04/14 1652 12/04/14 2303 12/05/14 1042 12/06/14 0437  NA  --   < > 137 137 137 139 138  K  --   < > 4.1 4.0 4.1 4.0 3.9  CL  --   < > 99* 98* 102 105 105  CO2  --   < > 29 30 27 27 26   GLUCOSE  --   < >  87 83 118* 78 95  BUN  --   < > 62* 61* 61* 57* 51*  CREATININE  --   < > 5.17* 5.09* 4.80* 4.56* 4.10*  CALCIUM 15.0*  < > 13.8* 13.6* 12.9* 12.9* 11.9*  MG 1.5*  --   --   --   --   --   --   PHOS 4.9*  --   --   --   --   --   --   < > = values in this interval not displayed. Liver Function Tests:  Recent Labs Lab 12/03/14 1631 12/05/14 1042 12/06/14 0437  AST 23 24 20   ALT 17 16* 15*  ALKPHOS 57 50 50  BILITOT 0.5 0.4 0.5  PROT 7.2 5.8* 5.6*  ALBUMIN 3.7 2.9* 2.8*   No results  for input(s): LIPASE, AMYLASE in the last 168 hours. No results for input(s): AMMONIA in the last 168 hours. CBC:  Recent Labs Lab 12/03/14 1631  WBC 9.1  HGB 14.0  HCT 41.3  MCV 89.0  PLT 282   Cardiac Enzymes: No results for input(s): CKTOTAL, CKMB, CKMBINDEX, TROPONINI in the last 168 hours. BNP (last 3 results)  Recent Labs  12/03/14 1631  BNP 102.8*    ProBNP (last 3 results) No results for input(s): PROBNP in the last 8760 hours.  CBG:  Recent Labs Lab 12/05/14 1956 12/05/14 2345 12/06/14 0432 12/06/14 0817 12/06/14 1214  GLUCAP 175* 70 92 82 121*    No results found for this or any previous visit (from the past 240 hour(s)).   Studies: Ct Abdomen Pelvis Wo Contrast  12/03/2014   CLINICAL DATA:  Generalized fatigue and cough for a week. No appetite.  EXAM: CT CHEST, ABDOMEN AND PELVIS WITHOUT CONTRAST  TECHNIQUE: Multidetector CT imaging of the chest, abdomen and pelvis was performed following the standard protocol without IV contrast.  COMPARISON:  None.  FINDINGS: CT CHEST FINDINGS  Normal heart size. Coronary artery calcifications. 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 Aortic calcification. No definite lymphadenopathy in the chest although non conscious examination limits evaluation of hilar lymph nodes. Esophagus is decompressed.  Mild linear atelectasis or fibrosis in the lung bases. No airspace disease or consolidation. No parenchymal mass lesion identified. Mild emphysematous changes in the upper lungs. No pleural effusions. No pneumothorax.  CT ABDOMEN AND PELVIS FINDINGS  The unenhanced appearance of the liver, spleen, gallbladder, pancreas, adrenal glands, is unremarkable. Kidneys appear atrophic without hydronephrosis. Multiple bilateral renal mass lesions probably representing cysts. Calcification of the aorta without aneurysm. There is extensive lymphadenopathy in the retroperitoneum involving predominantly the pericaval region and extending from the  abdominal hiatus down into the upper pelvis. Conglomerate mass of lymph nodes in the pericaval region measures up to about 14.3 x 12.3 x 7.4 cm. Additional enlarged lymph nodes are also demonstrated in the periaortic regions. With the large lymph node mass displaces the aorta and partially surrounds the aorta. Inferior vena cava cannot be distinguished from the mass. This is likely to represent lymphoma although sarcoma or other retroperitoneal mass could also have this appearance. Stomach, small bowel, and colon are not abnormally distended. No free air or free fluid in the abdomen. Mild infiltration in the mesenteries probably reactive. Abdominal wall musculature appears intact.  Pelvis: Prostate gland is mildly enlarged. Bladder wall is not thickened. Appendix is not identified. No free or loculated pelvic fluid collections. Small bilateral inguinal hernias containing fat.  Bones: Diffuse degenerative changes throughout the thoracic and  lumbar spine. No vertebral compression deformities. No destructive bone lesions. Degenerative changes in the hips. Sacrum and pelvis appear intact.  IMPRESSION: No evidence of active pulmonary disease. 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 Recommend annual imaging followup by CTA or MRA. This recommendation follows 2010 ACCF/AHA/AATS/ACR/ASA/SCA/SCAI/SIR/STS/SVM Guidelines for the Diagnosis and Management of Patients with Thoracic Aortic Disease. Circulation. 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 Multiple bilateral renal lesions likely representing cysts. Bilateral inguinal hernias containing fat.   Electronically Signed   By: Lucienne Capers M.D.   On: 12/03/2014 23:15   Dg Chest 2 View  12/03/2014   CLINICAL DATA:  Fever and tachycardia  EXAM: CHEST  2 VIEW  COMPARISON:  None.  FINDINGS: The lungs are mildly hyperexpanded. There is scarring in the right base. There is mild central peribronchial thickening.  There is a nipple shadow on the right. There is no edema or consolidation. The heart size is normal. The pulmonary vascularity is within normal limits. There is atherosclerotic change in aorta. There is no adenopathy. There is degenerative change in the thoracic spine.  IMPRESSION: Mild scarring right base. No edema or consolidation. Lungs mildly hyperexpanded with mild central peribronchial thickening. Suspect a degree of chronic bronchitis.   Electronically Signed   By: Lowella Grip III M.D.   On: 12/03/2014 16:55   Dg Knee 1-2 Views Left  12/04/2014   CLINICAL DATA:  Medial left knee swelling at distal femur. Pain with weight-bearing. No injury.  EXAM: LEFT KNEE - 1-2 VIEW  COMPARISON:  None.  FINDINGS: Examination demonstrates moderate osteoarthritic change involving the lateral compartment and to a lesser extent involving the medial compartment and patellofemoral joints. No evidence of fracture or dislocation. Suggestion of a significant joint effusion. Mild calcified plaque over the femoral artery.  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 Significant joint effusion.   Electronically Signed   By: Marin Olp M.D.   On: 12/04/2014 13:41   Ct Chest Wo Contrast  12/03/2014   CLINICAL DATA:  Generalized fatigue and cough for a week. No appetite.  EXAM: CT CHEST, ABDOMEN AND PELVIS WITHOUT CONTRAST  TECHNIQUE: Multidetector CT imaging of the chest, abdomen and pelvis was performed following the standard protocol without IV contrast.  COMPARISON:  None.  FINDINGS: CT CHEST FINDINGS  Normal heart size. Coronary artery calcifications. 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 Aortic calcification. No definite lymphadenopathy in the chest although non conscious examination limits evaluation of hilar lymph nodes. Esophagus is decompressed.  Mild linear atelectasis or fibrosis in the lung bases. No airspace disease or consolidation. No  parenchymal mass lesion identified. Mild emphysematous changes in the upper lungs. No pleural effusions. No pneumothorax.  CT ABDOMEN AND PELVIS FINDINGS  The unenhanced appearance of the liver, spleen, gallbladder, pancreas, adrenal glands, is unremarkable. Kidneys appear atrophic without hydronephrosis. Multiple bilateral renal mass lesions probably representing cysts. Calcification of the aorta without aneurysm. There is extensive lymphadenopathy in the retroperitoneum involving predominantly the pericaval region and extending from the abdominal hiatus down into the upper pelvis. Conglomerate mass of lymph nodes in the pericaval region measures up to about 14.3 x 12.3 x 7.4 cm. Additional enlarged lymph nodes are also demonstrated in the periaortic regions. With the large lymph node mass displaces the aorta and partially surrounds the aorta. Inferior vena cava cannot be distinguished from the mass. This is likely to represent lymphoma although sarcoma or other retroperitoneal mass could  also have this appearance. Stomach, small bowel, and colon are not abnormally distended. No free air or free fluid in the abdomen. Mild infiltration in the mesenteries probably reactive. Abdominal wall musculature appears intact.  Pelvis: Prostate gland is mildly enlarged. Bladder wall is not thickened. Appendix is not identified. No free or loculated pelvic fluid collections. Small bilateral inguinal hernias containing fat.  Bones: Diffuse degenerative changes throughout the thoracic and lumbar spine. No vertebral compression deformities. No destructive bone lesions. Degenerative changes in the hips. Sacrum and pelvis appear intact.  IMPRESSION: No evidence of active pulmonary disease. 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 Recommend annual imaging followup by CTA or MRA. This recommendation follows 2010 ACCF/AHA/AATS/ACR/ASA/SCA/SCAI/SIR/STS/SVM Guidelines for the Diagnosis and Management of Patients with Thoracic Aortic  Disease. Circulation. 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 Multiple bilateral renal lesions likely representing cysts. Bilateral inguinal hernias containing fat.   Electronically Signed   By: Lucienne Capers M.D.   On: 12/03/2014 23:15    Scheduled Meds: . heparin  5,000 Units Subcutaneous 3 times per day  . insulin aspart  0-9 Units Subcutaneous 6 times per day  . insulin glargine  15 Units Subcutaneous QHS  . sodium chloride  3 mL Intravenous Q12H   Continuous Infusions:    Principal Problem:   Hypercalcemia Active Problems:   Acute on chronic kidney failure   HTN (hypertension)   DM2 (diabetes mellitus, type 2)   Retroperitoneal lymphadenopathy    Time spent: *25 minutes    [facility] Pager [phone]. If 7PM-7AM, please contact night-coverage at [email or web address], password [facility] 12/06/2014, 2:36 PM  LOS: 3 days

## 2014-12-06 NOTE — Care Management Note (Signed)
Case Management Note  Patient Details  Name: Billy Gray MRN: [id number] Date of Birth: [birth year]  Subjective/Objective:                    Action/Plan:   Expected Discharge Date:  12/10/14               Expected Discharge Plan:  Home/Self Care  In-House Referral:  NA  Discharge planning Services  NA  Post Acute Care Choice:  NA Choice offered to:  NA  DME Arranged:    DME Agency:     HH Arranged:    HH Agency:     Status of Service:  Completed, signed off  Medicare Important Message Given:  Yes Date Medicare IM Given:  12/06/14 Medicare IM give by:  Demetrios Isaacs RN MPH, case manager, [phone] Date Additional Medicare IM Given:    Additional Medicare Important Message give by:     If discussed at [place] of Stay Meetings, dates discussed:    Additional Comments:  Adron Bene, RN 12/06/2014, 3:51 PM

## 2014-12-07 LAB — COMPREHENSIVE METABOLIC PANEL
ALBUMIN: 3.3 g/dL — AB (ref 3.5–5.0)
ALT: 16 U/L — AB (ref 17–63)
AST: 23 U/L (ref 15–41)
Alkaline Phosphatase: 78 U/L (ref 38–126)
Anion gap: 10 (ref 5–15)
BUN: 40 mg/dL — ABNORMAL HIGH (ref 6–20)
CALCIUM: 12.6 mg/dL — AB (ref 8.9–10.3)
CO2: 29 mmol/L (ref 22–32)
Chloride: 99 mmol/L — ABNORMAL LOW (ref 101–111)
Creatinine, Ser: 3.75 mg/dL — ABNORMAL HIGH (ref 0.61–1.24)
GFR calc non Af Amer: 15 mL/min — ABNORMAL LOW (ref >60)
GFR, EST AFRICAN AMERICAN: 18 mL/min — AB (ref >60)
GLUCOSE: 102 mg/dL — AB (ref 65–99)
Potassium: 3.6 mmol/L (ref 3.5–5.1)
SODIUM: 138 mmol/L (ref 135–145)
TOTAL PROTEIN: 6.8 g/dL (ref 6.5–8.1)
Total Bilirubin: 0.7 mg/dL (ref 0.3–1.2)

## 2014-12-07 LAB — GLUCOSE, CAPILLARY
GLUCOSE-CAPILLARY: 90 mg/dL (ref 65–99)
GLUCOSE-CAPILLARY: 199 mg/dL — AB (ref 65–99)
Glucose-Capillary: 90 mg/dL (ref 65–99)
Glucose-Capillary: 110 mg/dL — ABNORMAL HIGH (ref 65–99)
Glucose-Capillary: 179 mg/dL — ABNORMAL HIGH (ref 65–99)

## 2014-12-07 LAB — VITAMIN D 1,25 DIHYDROXY
VITAMIN D3 1, 25 (OH): 103 pg/mL
Vitamin D 1, 25 (OH)2 Total: 103 pg/mL

## 2014-12-07 MED ORDER — INSULIN ASPART 100 UNIT/ML ~~LOC~~ SOLN
0.0000 [IU] | Freq: Every day | SUBCUTANEOUS | Status: DC
  Administered 2014-12-08: 2 [IU] via SUBCUTANEOUS

## 2014-12-07 MED ORDER — INSULIN ASPART 100 UNIT/ML ~~LOC~~ SOLN
0.0000 [IU] | Freq: Three times a day (TID) | SUBCUTANEOUS | Status: DC
  Administered 2014-12-08: 1 [IU] via SUBCUTANEOUS
  Administered 2014-12-08: 5 [IU] via SUBCUTANEOUS
  Administered 2014-12-08: 3 [IU] via SUBCUTANEOUS
  Administered 2014-12-09: 2 [IU] via SUBCUTANEOUS
  Administered 2014-12-09: 5 [IU] via SUBCUTANEOUS

## 2014-12-07 NOTE — Progress Notes (Signed)
[facility] PROGRESS NOTE  Marek Nghiem OIZ:[id number] DOB: 1945/07/30 DOA: 12/03/2014 PCP: Pcp Not In System  Assessment/Plan:  1. Hypercalcemia- patient came with calcium of 15.0, this morning calcium was 12.6, after started IV hydration with normal saline. Nephrology has been consulted, IV pamidronate has been started. Patient received 6 doses of calcitonin. 2. Malignancy- CT abdomen reveals retroperitoneal lymphadenopathy, Conglomerate mass of lymph nodes in the pericaval region measures up to about 14.3 x 12.3 x 7.4 cm. Additional enlarged lymph nodes are also demonstrated in the periaortic regions. Called and discussed with hematology/oncology on call Dr. Alen Blew, who recommends getting the core biopsy per IR.  Biopsy done from the retroperitoneal lymph nodes today per IR.Await the biopsy report.  3. Left knee swelling- improved, x-ray of left knee reveals significant joint effusion. Patient had arthrocentesis 2 weeks ago at [facility], will follow [facility] as outpatient 4. CKD stage IV- patient's creatinine is improving, today creatinine is 3.75 patient is currently not on hemodialysis, was told by [facility] at [place] that he is nearing dialysis. Nephrology is following and managing the fluid balance. HCTZ and ACE inhibitor have been held per nephrology.  5. Diabetes mellitus- continue sliding scale insulin, Lantus. Blood glucose is well controlled. 6. Thoracic aortic aneurysm- stable ,CT chest shows 4.1 cm of ascending thoracic aortic aneurysm, patient will need yearly CT chest to assess the growth. I discussed with patient and his wife at bedside. 7. Hypertension- patient is currently hypotensive, both HCTZ as well as ACE inhibitor are on hold.  Code Status: Full code Family Communication: Discussed with patient's wife at bedside Disposition Plan: Home when stable   Consultants:  Nephrology  Procedures:  None  Antibiotics:  None  HPI/Subjective: 69 y.o. male who presents to  the ED with c/o fatigue, generalized weakness, cough, decreased PO intake for the past 2 weeks or so. Taking small amount of tums or peptobismol recently. Has h/o kidney problems, diagnosed 3 months ago with CKD at the [place] system and told his "creatinine was near needing dialysis". Chronic cough, on 3L o2 at home for COPD. Was also diagnosed with renal cyst at the [place] but unsure which side this was on.  This morning patient feels better, no chest pain no shortness of breath. Wants to go home.   Objective: Filed Vitals:   12/07/14 0900  BP: 131/79  Pulse: 80  Temp: 98.2 F (36.8 C)  Resp: 16    Intake/Output Summary (Last 24 hours) at 12/07/14 1323 Last data filed at 12/07/14 0600  Gross per 24 hour  Intake    924 ml  Output    200 ml  Net    724 ml   Filed Weights   12/04/14 2025 12/05/14 1950 12/07/14 0505  Weight: 98.2 kg (216 lb 7.9 oz) 99.7 kg (219 lb 12.8 oz) 98 kg (216 lb 0.8 oz)    Exam:   General:  Appears in no acute distress  Cardiovascular: S1-S2 is regular, no murmurs rubs or gallops  Respiratory: Clear to auscultation bilaterally  Abdomen: Soft, nontender, no organomegaly  Musculoskeletal: Mild swelling noted in the left knee   Data Reviewed: Basic Metabolic Panel:  Recent Labs Lab 12/03/14 2104  12/04/14 1652 12/04/14 2303 12/05/14 1042 12/06/14 0437 12/07/14 0644  NA  --   < > 137 137 139 138 138  K  --   < > 4.0 4.1 4.0 3.9 3.6  CL  --   < > 98* 102 105 105 99*  CO2  --   < >  30 27 27 26 29   GLUCOSE  --   < > 83 118* 78 95 102*  BUN  --   < > 61* 61* 57* 51* 40*  CREATININE  --   < > 5.09* 4.80* 4.56* 4.10* 3.75*  CALCIUM 15.0*  < > 13.6* 12.9* 12.9* 11.9* 12.6*  MG 1.5*  --   --   --   --   --   --   PHOS 4.9*  --   --   --   --   --   --   < > = values in this interval not displayed. Liver Function Tests:  Recent Labs Lab 12/03/14 1631 12/05/14 1042 12/06/14 0437 12/07/14 0644  AST 23 24 20 23   ALT 17 16* 15* 16*  ALKPHOS 57  50 50 78  BILITOT 0.5 0.4 0.5 0.7  PROT 7.2 5.8* 5.6* 6.8  ALBUMIN 3.7 2.9* 2.8* 3.3*   No results for input(s): LIPASE, AMYLASE in the last 168 hours. No results for input(s): AMMONIA in the last 168 hours. CBC:  Recent Labs Lab 12/03/14 1631  WBC 9.1  HGB 14.0  HCT 41.3  MCV 89.0  PLT 282   Cardiac Enzymes: No results for input(s): CKTOTAL, CKMB, CKMBINDEX, TROPONINI in the last 168 hours. BNP (last 3 results)  Recent Labs  12/03/14 1631  BNP 102.8*    ProBNP (last 3 results) No results for input(s): PROBNP in the last 8760 hours.  CBG:  Recent Labs Lab 12/06/14 2009 12/06/14 2348 12/07/14 0503 12/07/14 0734 12/07/14 1126  GLUCAP 170* 110* 90 90 199*    No results found for this or any previous visit (from the past 240 hour(s)).   Studies: Ct Abdomen Pelvis Wo Contrast  12/03/2014   CLINICAL DATA:  Generalized fatigue and cough for a week. No appetite.  EXAM: CT CHEST, ABDOMEN AND PELVIS WITHOUT CONTRAST  TECHNIQUE: Multidetector CT imaging of the chest, abdomen and pelvis was performed following the standard protocol without IV contrast.  COMPARISON:  None.  FINDINGS: CT CHEST FINDINGS  Normal heart size. Coronary artery calcifications. 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 Aortic calcification. No definite lymphadenopathy in the chest although non conscious examination limits evaluation of hilar lymph nodes. Esophagus is decompressed.  Mild linear atelectasis or fibrosis in the lung bases. No airspace disease or consolidation. No parenchymal mass lesion identified. Mild emphysematous changes in the upper lungs. No pleural effusions. No pneumothorax.  CT ABDOMEN AND PELVIS FINDINGS  The unenhanced appearance of the liver, spleen, gallbladder, pancreas, adrenal glands, is unremarkable. Kidneys appear atrophic without hydronephrosis. Multiple bilateral renal mass lesions probably representing cysts. Calcification of the aorta without aneurysm. There is extensive  lymphadenopathy in the retroperitoneum involving predominantly the pericaval region and extending from the abdominal hiatus down into the upper pelvis. Conglomerate mass of lymph nodes in the pericaval region measures up to about 14.3 x 12.3 x 7.4 cm. Additional enlarged lymph nodes are also demonstrated in the periaortic regions. With the large lymph node mass displaces the aorta and partially surrounds the aorta. Inferior vena cava cannot be distinguished from the mass. This is likely to represent lymphoma although sarcoma or other retroperitoneal mass could also have this appearance. Stomach, small bowel, and colon are not abnormally distended. No free air or free fluid in the abdomen. Mild infiltration in the mesenteries probably reactive. Abdominal wall musculature appears intact.  Pelvis: Prostate gland is mildly enlarged. Bladder wall is not thickened. Appendix is not identified. No  free or loculated pelvic fluid collections. Small bilateral inguinal hernias containing fat.  Bones: Diffuse degenerative changes throughout the thoracic and lumbar spine. No vertebral compression deformities. No destructive bone lesions. Degenerative changes in the hips. Sacrum and pelvis appear intact.  IMPRESSION: No evidence of active pulmonary disease. 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 Recommend annual imaging followup by CTA or MRA. This recommendation follows 2010 ACCF/AHA/AATS/ACR/ASA/SCA/SCAI/SIR/STS/SVM Guidelines for the Diagnosis and Management of Patients with Thoracic Aortic Disease. Circulation. 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 Multiple bilateral renal lesions likely representing cysts. Bilateral inguinal hernias containing fat.   Electronically Signed   By: Lucienne Capers M.D.   On: 12/03/2014 23:15   Dg Chest 2 View  12/03/2014   CLINICAL DATA:  Fever and tachycardia  EXAM: CHEST  2 VIEW  COMPARISON:  None.  FINDINGS: The lungs are  mildly hyperexpanded. There is scarring in the right base. There is mild central peribronchial thickening. There is a nipple shadow on the right. There is no edema or consolidation. The heart size is normal. The pulmonary vascularity is within normal limits. There is atherosclerotic change in aorta. There is no adenopathy. There is degenerative change in the thoracic spine.  IMPRESSION: Mild scarring right base. No edema or consolidation. Lungs mildly hyperexpanded with mild central peribronchial thickening. Suspect a degree of chronic bronchitis.   Electronically Signed   By: Lowella Grip III M.D.   On: 12/03/2014 16:55   Dg Knee 1-2 Views Left  12/04/2014   CLINICAL DATA:  Medial left knee swelling at distal femur. Pain with weight-bearing. No injury.  EXAM: LEFT KNEE - 1-2 VIEW  COMPARISON:  None.  FINDINGS: Examination demonstrates moderate osteoarthritic change involving the lateral compartment and to a lesser extent involving the medial compartment and patellofemoral joints. No evidence of fracture or dislocation. Suggestion of a significant joint effusion. Mild calcified plaque over the femoral artery.  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 Significant joint effusion.   Electronically Signed   By: Marin Olp M.D.   On: 12/04/2014 13:41   Ct Chest Wo Contrast  12/03/2014   CLINICAL DATA:  Generalized fatigue and cough for a week. No appetite.  EXAM: CT CHEST, ABDOMEN AND PELVIS WITHOUT CONTRAST  TECHNIQUE: Multidetector CT imaging of the chest, abdomen and pelvis was performed following the standard protocol without IV contrast.  COMPARISON:  None.  FINDINGS: CT CHEST FINDINGS  Normal heart size. Coronary artery calcifications. 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 Aortic calcification. No definite lymphadenopathy in the chest although non conscious examination limits evaluation of hilar lymph nodes. Esophagus is decompressed.   Mild linear atelectasis or fibrosis in the lung bases. No airspace disease or consolidation. No parenchymal mass lesion identified. Mild emphysematous changes in the upper lungs. No pleural effusions. No pneumothorax.  CT ABDOMEN AND PELVIS FINDINGS  The unenhanced appearance of the liver, spleen, gallbladder, pancreas, adrenal glands, is unremarkable. Kidneys appear atrophic without hydronephrosis. Multiple bilateral renal mass lesions probably representing cysts. Calcification of the aorta without aneurysm. There is extensive lymphadenopathy in the retroperitoneum involving predominantly the pericaval region and extending from the abdominal hiatus down into the upper pelvis. Conglomerate mass of lymph nodes in the pericaval region measures up to about 14.3 x 12.3 x 7.4 cm. Additional enlarged lymph nodes are also demonstrated in the periaortic regions. With the large lymph node mass displaces the aorta and partially surrounds the aorta. Inferior  vena cava cannot be distinguished from the mass. This is likely to represent lymphoma although sarcoma or other retroperitoneal mass could also have this appearance. Stomach, small bowel, and colon are not abnormally distended. No free air or free fluid in the abdomen. Mild infiltration in the mesenteries probably reactive. Abdominal wall musculature appears intact.  Pelvis: Prostate gland is mildly enlarged. Bladder wall is not thickened. Appendix is not identified. No free or loculated pelvic fluid collections. Small bilateral inguinal hernias containing fat.  Bones: Diffuse degenerative changes throughout the thoracic and lumbar spine. No vertebral compression deformities. No destructive bone lesions. Degenerative changes in the hips. Sacrum and pelvis appear intact.  IMPRESSION: No evidence of active pulmonary disease. 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 Recommend annual imaging followup by CTA or MRA. This recommendation follows 2010  ACCF/AHA/AATS/ACR/ASA/SCA/SCAI/SIR/STS/SVM Guidelines for the Diagnosis and Management of Patients with Thoracic Aortic Disease. Circulation. 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 Multiple bilateral renal lesions likely representing cysts. Bilateral inguinal hernias containing fat.   Electronically Signed   By: Lucienne Capers M.D.   On: 12/03/2014 23:15    Scheduled Meds: . heparin  5,000 Units Subcutaneous 3 times per day  . insulin aspart  0-9 Units Subcutaneous 6 times per day  . insulin glargine  15 Units Subcutaneous QHS  . sodium chloride  3 mL Intravenous Q12H   Continuous Infusions:    Principal Problem:   Hypercalcemia Active Problems:   Acute on chronic kidney failure   HTN (hypertension)   DM2 (diabetes mellitus, type 2)   Retroperitoneal lymphadenopathy    Time spent: *25 minutes    [facility] Pager [phone]. If 7PM-7AM, please contact night-coverage at [email or web address], password [facility] 12/07/2014, 1:23 PM  LOS: 4 days

## 2014-12-07 NOTE — Progress Notes (Signed)
Admit: 12/03/2014 LOS: 4  60M with severe hypercalcemia, CKD4-5? With possible AKI, CT demonstrated retroperitoneal lymphadenopathy concerning for lymphoma.   Subjective:  Ca up to 12.6 this AM No other isues, eating more, no c/o  06/17 0701 - 06/18 0700 In: 1644 [P.O.:1644] Out: 550 [Urine:550]  Filed Weights   12/04/14 2025 12/05/14 1950 12/07/14 0505  Weight: 98.2 kg (216 lb 7.9 oz) 99.7 kg (219 lb 12.8 oz) 98 kg (216 lb 0.8 oz)    Scheduled Meds: . heparin  5,000 Units Subcutaneous 3 times per day  . insulin aspart  0-9 Units Subcutaneous 6 times per day  . insulin glargine  15 Units Subcutaneous QHS  . sodium chloride  3 mL Intravenous Q12H   Continuous Infusions:   PRN Meds:.HYDROcodone-acetaminophen, zolpidem  Current Labs: reviewed  PTH suppressed at 15 1,25 Vit D pending SPEP w/o M Spike sFLC consistent with CKD, K:L 1.70 PTH rP pending   Physical Exam:  Blood pressure 137/71, pulse 82, temperature 98.3 F (36.8 C), temperature source Oral, resp. rate 18, height 5\' 8"  (1.727 m), weight 98 kg (216 lb 0.8 oz), SpO2 96 %. GEN: NAD, obese ENT: NCAT EYES: EOMI LYMPH: no palpable supraclavicular, cervical. Axillary, or femoral nodes CV: RRR. No rub PULM: CTAB ABD: protuberant, s/nt SKIN: no rashes/lesions KGM:WNUUV b/l LEE  A/P 1. Hypercalcemia, Severe, admit at > 15.0 1. Rec Pamidronate 90mg  IV 6/16 2. Stop calcitonin 6/17 after 72h (last dose this PM) 3. Hold on further IVF, weight up ?9lb 4. Follow volume status closely -- diuretics as needed 5. Etiology 1. Likely driven by 2,53 OH excess related to lymphoma, level pending 2. Excluded monoclonal process with negative SPEP and sFLC 3. PTH suppressed and PTHrP pending, expect both to be low 4. No strong history to suggest milk alkali syndrome 5. Thiazide and Vit D PO are not sufficient to explain 6. 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 Unsure of baseline, sees [place], told 'nearing dialysis' 2. Further improvement in SCr with hydration 3. Cont to follow closely  4. Other electrolytes ok 5. 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 Daily weights, Daily Renal Panel, Strict I/Os, Avoid nephrotoxins (NSAIDs, judicious IV Contrast) 3. Extensive Retroperitoneal LAN 1. 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 S/p core Bx 12/05/14, path pending 3. TRH discussed with Heme-Onc, await pathology 4. DM2 5. HTN 1. Hold Thiazide and ACEi 2. Follow 3. CCB likely next agent if needed  Pearson Grippe MD 12/07/2014, 8:41 AM   Recent Labs Lab 12/03/14 2104  12/05/14 1042 12/06/14 0437 12/07/14 0644  NA  --   < > 139 138 138  K  --   < > 4.0 3.9 3.6  CL  --   < > 105 105 99*  CO2  --   < > 27 26 29   GLUCOSE  --   < > 78 95 102*  BUN  --   < > 57* 51* 40*  CREATININE  --   < > 4.56* 4.10* 3.75*  CALCIUM 15.0*  < > 12.9* 11.9* 12.6*  PHOS 4.9*  --   --   --   --   < > = values in this interval not displayed.  Recent Labs Lab 12/03/14 1631  WBC 9.1  HGB 14.0  HCT 41.3  MCV 89.0  PLT 282

## 2014-12-08 LAB — COMPREHENSIVE METABOLIC PANEL
ALT: 15 U/L — ABNORMAL LOW (ref 17–63)
AST: 17 U/L (ref 15–41)
Albumin: 2.9 g/dL — ABNORMAL LOW (ref 3.5–5.0)
Alkaline Phosphatase: 79 U/L (ref 38–126)
Anion gap: 6 (ref 5–15)
BUN: 34 mg/dL — AB (ref 6–20)
CO2: 30 mmol/L (ref 22–32)
CREATININE: 3.57 mg/dL — AB (ref 0.61–1.24)
Calcium: 12.5 mg/dL — ABNORMAL HIGH (ref 8.9–10.3)
Chloride: 103 mmol/L (ref 101–111)
GFR calc non Af Amer: 16 mL/min — ABNORMAL LOW (ref >60)
GFR, EST AFRICAN AMERICAN: 19 mL/min — AB (ref >60)
GLUCOSE: 128 mg/dL — AB (ref 65–99)
POTASSIUM: 3.7 mmol/L (ref 3.5–5.1)
Sodium: 139 mmol/L (ref 135–145)
Total Bilirubin: 0.7 mg/dL (ref 0.3–1.2)
Total Protein: 6.1 g/dL — ABNORMAL LOW (ref 6.5–8.1)

## 2014-12-08 LAB — GLUCOSE, CAPILLARY
GLUCOSE-CAPILLARY: 202 mg/dL — AB (ref 65–99)
GLUCOSE-CAPILLARY: 231 mg/dL — AB (ref 65–99)
Glucose-Capillary: 134 mg/dL — ABNORMAL HIGH (ref 65–99)
Glucose-Capillary: 280 mg/dL — ABNORMAL HIGH (ref 65–99)

## 2014-12-08 LAB — PTH-RELATED PEPTIDE: PTH-related peptide: <0.74 pmol/L

## 2014-12-08 MED ORDER — PREDNISONE 20 MG PO TABS
40.0000 mg | ORAL_TABLET | Freq: Every day | ORAL | Status: DC
  Administered 2014-12-08 – 2014-12-10 (×3): 40 mg via ORAL
  Filled 2014-12-08 (×4): qty 2

## 2014-12-08 NOTE — Progress Notes (Signed)
[facility] PROGRESS NOTE  Billy Gray GDJ:[id number] DOB: March 24, 1946 DOA: 12/03/2014 PCP: Pcp Not In System  Assessment/Plan:  1. Hypercalcemia- patient came with calcium of 15.0, this morning calcium was 12.5, after started IV hydration with normal saline. Nephrology has been consulted, IV pamidronate has been started. Patient received 6 doses of calcitonin. 2. Malignancy- CT abdomen reveals retroperitoneal lymphadenopathy, Conglomerate mass of lymph nodes in the pericaval region measures up to about 14.3 x 12.3 x 7.4 cm. Additional enlarged lymph nodes are also demonstrated in the periaortic regions. Called and discussed with hematology/oncology on call Dr. Alen Blew, who recommends getting the core biopsy per IR.  Biopsy done from the retroperitoneal lymph nodes today per IR.Await the biopsy report. Possibly on Monday. 3. Left knee swelling- improved, x-ray of left knee reveals significant joint effusion. Patient had arthrocentesis 2 weeks ago at [facility], will follow [place] as outpatient 4. CKD stage IV- patient's creatinine is improving, today creatinine is 3.75 patient is currently not on hemodialysis, was told by [facility] at [place] that he is nearing dialysis. Nephrology is following and managing the fluid balance. HCTZ and ACE inhibitor have been held per nephrology.  5. Diabetes mellitus- continue sliding scale insulin, Lantus. Blood glucose is well controlled. 6. Thoracic aortic aneurysm- stable ,CT chest shows 4.1 cm of ascending thoracic aortic aneurysm, patient will need yearly CT chest to assess the growth. I discussed with patient and his wife at bedside. 7. Hypertension- patient is currently hypotensive, both HCTZ as well as ACE inhibitor are on hold.  Code Status: Full code Family Communication: Discussed with patient's wife at bedside Disposition Plan: Home when stable   Consultants:  Nephrology  Procedures:  None  Antibiotics:  None  HPI/Subjective: 69 y.o. male  who presents to the ED with c/o fatigue, generalized weakness, cough, decreased PO intake for the past 2 weeks or so. Taking small amount of tums or peptobismol recently. Has h/o kidney problems, diagnosed 3 months ago with CKD at the [place] system and told his "creatinine was near needing dialysis". Chronic cough, on 3L o2 at home for COPD. Was also diagnosed with renal cyst at the [place] but unsure which side this was on.  This morning patient  denies pain , no shortness of breath.   Objective: Filed Vitals:   12/08/14 0958  BP: 148/73  Pulse: 80  Temp: 97.9 F (36.6 C)  Resp: 18    Intake/Output Summary (Last 24 hours) at 12/08/14 1508 Last data filed at 12/08/14 1300  Gross per 24 hour  Intake    480 ml  Output      0 ml  Net    480 ml   Filed Weights   12/05/14 1950 12/07/14 0505 12/07/14 2008  Weight: 99.7 kg (219 lb 12.8 oz) 98 kg (216 lb 0.8 oz) 98.5 kg (217 lb 2.5 oz)    Exam:   General:  Appears in no acute distress  Cardiovascular: S1-S2 is regular, no murmurs rubs or gallops  Respiratory: Clear to auscultation bilaterally  Abdomen: Soft, nontender, no organomegaly  Musculoskeletal: Mild swelling noted in the left knee   Data Reviewed: Basic Metabolic Panel:  Recent Labs Lab 12/03/14 2104  12/04/14 2303 12/05/14 1042 12/06/14 0437 12/07/14 0644 12/08/14 0539  NA  --   < > 137 139 138 138 139  K  --   < > 4.1 4.0 3.9 3.6 3.7  CL  --   < > 102 105 105 99* 103  CO2  --   < >  27 27 26 29 30   GLUCOSE  --   < > 118* 78 95 102* 128*  BUN  --   < > 61* 57* 51* 40* 34*  CREATININE  --   < > 4.80* 4.56* 4.10* 3.75* 3.57*  CALCIUM 15.0*  < > 12.9* 12.9* 11.9* 12.6* 12.5*  MG 1.5*  --   --   --   --   --   --   PHOS 4.9*  --   --   --   --   --   --   < > = values in this interval not displayed. Liver Function Tests:  Recent Labs Lab 12/03/14 1631 12/05/14 1042 12/06/14 0437 12/07/14 0644 12/08/14 0539  AST 23 24 20 23 17   ALT 17 16* 15* 16* 15*   ALKPHOS 57 50 50 78 79  BILITOT 0.5 0.4 0.5 0.7 0.7  PROT 7.2 5.8* 5.6* 6.8 6.1*  ALBUMIN 3.7 2.9* 2.8* 3.3* 2.9*   CBC:  Recent Labs Lab 12/03/14 1631  WBC 9.1  HGB 14.0  HCT 41.3  MCV 89.0  PLT 282    Recent Labs  12/03/14 1631  BNP 102.8*    ProBNP (last 3 results) No results for input(s): PROBNP in the last 8760 hours.  CBG:  Recent Labs Lab 12/07/14 1126 12/07/14 1715 12/07/14 2008 12/08/14 0800 12/08/14 1139  GLUCAP 199* 110* 179* 134* 280*    No results found for this or any previous visit (from the past 240 hour(s)).   Studies: Ct Abdomen Pelvis Wo Contrast  12/03/2014   CLINICAL DATA:  Generalized fatigue and cough for a week. No appetite.  EXAM: CT CHEST, ABDOMEN AND PELVIS WITHOUT CONTRAST  TECHNIQUE: Multidetector CT imaging of the chest, abdomen and pelvis was performed following the standard protocol without IV contrast.  COMPARISON:  None.  FINDINGS: CT CHEST FINDINGS  Normal heart size. Coronary artery calcifications. 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 Aortic calcification. No definite lymphadenopathy in the chest although non conscious examination limits evaluation of hilar lymph nodes. Esophagus is decompressed.  Mild linear atelectasis or fibrosis in the lung bases. No airspace disease or consolidation. No parenchymal mass lesion identified. Mild emphysematous changes in the upper lungs. No pleural effusions. No pneumothorax.  CT ABDOMEN AND PELVIS FINDINGS  The unenhanced appearance of the liver, spleen, gallbladder, pancreas, adrenal glands, is unremarkable. Kidneys appear atrophic without hydronephrosis. Multiple bilateral renal mass lesions probably representing cysts. Calcification of the aorta without aneurysm. There is extensive lymphadenopathy in the retroperitoneum involving predominantly the pericaval region and extending from the abdominal hiatus down into the upper pelvis. Conglomerate mass of lymph nodes in the pericaval region  measures up to about 14.3 x 12.3 x 7.4 cm. Additional enlarged lymph nodes are also demonstrated in the periaortic regions. With the large lymph node mass displaces the aorta and partially surrounds the aorta. Inferior vena cava cannot be distinguished from the mass. This is likely to represent lymphoma although sarcoma or other retroperitoneal mass could also have this appearance. Stomach, small bowel, and colon are not abnormally distended. No free air or free fluid in the abdomen. Mild infiltration in the mesenteries probably reactive. Abdominal wall musculature appears intact.  Pelvis: Prostate gland is mildly enlarged. Bladder wall is not thickened. Appendix is not identified. No free or loculated pelvic fluid collections. Small bilateral inguinal hernias containing fat.  Bones: Diffuse degenerative changes throughout the thoracic and lumbar spine. No vertebral compression deformities. No destructive bone lesions. Degenerative  changes in the hips. Sacrum and pelvis appear intact.  IMPRESSION: No evidence of active pulmonary disease. 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 Recommend annual imaging followup by CTA or MRA. This recommendation follows 2010 ACCF/AHA/AATS/ACR/ASA/SCA/SCAI/SIR/STS/SVM Guidelines for the Diagnosis and Management of Patients with Thoracic Aortic Disease. Circulation. 2010; 121: H997-F414  Prominent retroperitoneal lymphadenopathy most likely to represent lymphoma. Multiple bilateral renal lesions likely representing cysts. Bilateral inguinal hernias containing fat.   Electronically Signed   By: Lucienne Capers M.D.   On: 12/03/2014 23:15   Dg Chest 2 View  12/03/2014   CLINICAL DATA:  Fever and tachycardia  EXAM: CHEST  2 VIEW  COMPARISON:  None.  FINDINGS: The lungs are mildly hyperexpanded. There is scarring in the right base. There is mild central peribronchial thickening. There is a nipple shadow on the right. There is no edema or consolidation. The heart size is normal. The  pulmonary vascularity is within normal limits. There is atherosclerotic change in aorta. There is no adenopathy. There is degenerative change in the thoracic spine.  IMPRESSION: Mild scarring right base. No edema or consolidation. Lungs mildly hyperexpanded with mild central peribronchial thickening. Suspect a degree of chronic bronchitis.   Electronically Signed   By: Lowella Grip III M.D.   On: 12/03/2014 16:55   Dg Knee 1-2 Views Left  12/04/2014   CLINICAL DATA:  Medial left knee swelling at distal femur. Pain with weight-bearing. No injury.  EXAM: LEFT KNEE - 1-2 VIEW  COMPARISON:  None.  FINDINGS: Examination demonstrates moderate osteoarthritic change involving the lateral compartment and to a lesser extent involving the medial compartment and patellofemoral joints. No evidence of fracture or dislocation. Suggestion of a significant joint effusion. Mild calcified plaque over the femoral artery.  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 Significant joint effusion.   Electronically Signed   By: Marin Olp M.D.   On: 12/04/2014 13:41     Scheduled Meds: . heparin  5,000 Units Subcutaneous 3 times per day  . insulin aspart  0-5 Units Subcutaneous QHS  . insulin aspart  0-9 Units Subcutaneous TID WC  . insulin glargine  15 Units Subcutaneous QHS  . predniSONE  40 mg Oral Q breakfast  . sodium chloride  3 mL Intravenous Q12H   Continuous Infusions:    Principal Problem:   Hypercalcemia Active Problems:   Acute on chronic kidney failure   HTN (hypertension)   DM2 (diabetes mellitus, type 2)   Retroperitoneal lymphadenopathy    Time spent: *25 minutes    [facility] Pager [phone]. If 7PM-7AM, please contact night-coverage at [email or web address], password [facility] 12/08/2014, 3:08 PM  LOS: 5 days

## 2014-12-08 NOTE — Progress Notes (Signed)
Admit: 12/03/2014 LOS: 5  55M with severe hypercalcemia, CKD4-5? With possible AKI, CT demonstrated retroperitoneal lymphadenopathy concerning for lymphoma.   Subjective:  Ca stable at 12.5 No other isues, eating more, no c/o    [facility] Weights   12/05/14 1950 12/07/14 0505 12/07/14 2008  Weight: 99.7 kg (219 lb 12.8 oz) 98 kg (216 lb 0.8 oz) 98.5 kg (217 lb 2.5 oz)    Scheduled Meds: . heparin  5,000 Units Subcutaneous 3 times per day  . insulin aspart  0-5 Units Subcutaneous QHS  . insulin aspart  0-9 Units Subcutaneous TID WC  . insulin glargine  15 Units Subcutaneous QHS  . predniSONE  40 mg Oral Q breakfast  . sodium chloride  3 mL Intravenous Q12H   Continuous Infusions:   PRN Meds:.HYDROcodone-acetaminophen, zolpidem  Current Labs: reviewed  PTH suppressed at 15 1,25 Vit D elevated at 103 SPEP w/o M Spike sFLC consistent with CKD, K:L 1.70 PTH rP negative   Physical Exam:  Blood pressure 128/80, pulse 88, temperature 98.4 F (36.9 C), temperature source Oral, resp. rate 18, height 5\' 8"  (1.727 m), weight 98.5 kg (217 lb 2.5 oz), SpO2 96 %. GEN: NAD, obese ENT: NCAT EYES: EOMI LYMPH: no palpable supraclavicular, cervical. Axillary, or femoral nodes CV: RRR. No rub PULM: CTAB ABD: protuberant, s/nt SKIN: no rashes/lesions ZOX:WRUEA b/l LEE  A/P 1. Hypercalcemia 2/2 excessive endogenous 1,25 Vit D; Severe, admit at > 15.0 1. Rec Pamidronate 90mg  IV 6/16 2. Stopped calcitonin 6/17 after 72h (last dose this PM) 3. Hold on further IVF, weight up ?9lb 4. Follow volume status closely -- diuretics as needed 5. Etiology 1. Driven by 5,40 OH excess related to likely lymphoma 2. Excluded monoclonal process with negative SPEP and sFLC 3. PTH suppressed and PTHrP negative 4. No strong history to suggest milk alkali syndrome 5. Thiazide and Vit D PO are not sufficient to explain 6. Start prednisone 40mg /day today for 1,25 OH Vit D Process now that Bx complete 7. If  remains refractory, discuss use of denosumab with oncology 2. CKD4-5 1. Unsure of baseline, sees [place], told 'nearing dialysis' 2. Has continuous improvement in SCr with hydration 3. Cont to follow closely  4. Other electrolytes ok 5. 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 Daily weights, Daily Renal Panel, Strict I/Os, Avoid nephrotoxins (NSAIDs, judicious IV Contrast) 3. Extensive Retroperitoneal LAN 1. 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 S/p core Bx 12/05/14, path pending 3. TRH discussed with Heme-Onc, await pathology 4. DM2 5. HTN 1. Hold Thiazide and ACEi 2. Follow 3. CCB likely next agent if needed  Pearson Grippe MD 12/08/2014, 9:05 AM   Recent Labs Lab 12/03/14 2104  12/06/14 0437 12/07/14 0644 12/08/14 0539  NA  --   < > 138 138 139  K  --   < > 3.9 3.6 3.7  CL  --   < > 105 99* 103  CO2  --   < > 26 29 30   GLUCOSE  --   < > 95 102* 128*  BUN  --   < > 51* 40* 34*  CREATININE  --   < > 4.10* 3.75* 3.57*  CALCIUM 15.0*  < > 11.9* 12.6* 12.5*  PHOS 4.9*  --   --   --   --   < > = values in this interval not displayed.  Recent Labs Lab 12/03/14 1631  WBC 9.1  HGB 14.0  HCT 41.3  MCV 89.0  PLT 282

## 2014-12-09 DIAGNOSIS — C8330 Diffuse large B-cell lymphoma, unspecified site: Secondary | ICD-10-CM

## 2014-12-09 DIAGNOSIS — N189 Chronic kidney disease, unspecified: Secondary | ICD-10-CM

## 2014-12-09 DIAGNOSIS — E46 Unspecified protein-calorie malnutrition: Secondary | ICD-10-CM

## 2014-12-09 DIAGNOSIS — Z7901 Long term (current) use of anticoagulants: Secondary | ICD-10-CM

## 2014-12-09 DIAGNOSIS — C8333 Diffuse large B-cell lymphoma, intra-abdominal lymph nodes: Secondary | ICD-10-CM

## 2014-12-09 LAB — GLUCOSE, CAPILLARY
GLUCOSE-CAPILLARY: 333 mg/dL — AB (ref 65–99)
Glucose-Capillary: 151 mg/dL — ABNORMAL HIGH (ref 65–99)
Glucose-Capillary: 293 mg/dL — ABNORMAL HIGH (ref 65–99)
Glucose-Capillary: 234 mg/dL — ABNORMAL HIGH (ref 65–99)

## 2014-12-09 LAB — COMPREHENSIVE METABOLIC PANEL
ALBUMIN: 3 g/dL — AB (ref 3.5–5.0)
ALT: 16 U/L — ABNORMAL LOW (ref 17–63)
ANION GAP: 8 (ref 5–15)
AST: 18 U/L (ref 15–41)
Alkaline Phosphatase: 100 U/L (ref 38–126)
BUN: 38 mg/dL — ABNORMAL HIGH (ref 6–20)
CO2: 29 mmol/L (ref 22–32)
CREATININE: 3.19 mg/dL — AB (ref 0.61–1.24)
Calcium: 12.5 mg/dL — ABNORMAL HIGH (ref 8.9–10.3)
Chloride: 101 mmol/L (ref 101–111)
GFR calc non Af Amer: 19 mL/min — ABNORMAL LOW (ref >60)
GFR, EST AFRICAN AMERICAN: 21 mL/min — AB (ref >60)
Glucose, Bld: 149 mg/dL — ABNORMAL HIGH (ref 65–99)
Potassium: 3.9 mmol/L (ref 3.5–5.1)
Sodium: 138 mmol/L (ref 135–145)
Total Bilirubin: 0.4 mg/dL (ref 0.3–1.2)
Total Protein: 6.2 g/dL — ABNORMAL LOW (ref 6.5–8.1)

## 2014-12-09 LAB — PTH, INTACT AND CALCIUM
Calcium, Total (PTH): 15 mg/dL (ref 8.6–10.2)
PTH: 15 pg/mL (ref 15–65)

## 2014-12-09 LAB — LACTATE DEHYDROGENASE: LDH: 139 U/L (ref 98–192)

## 2014-12-09 MED ORDER — ALLOPURINOL 100 MG PO TABS
100.0000 mg | ORAL_TABLET | Freq: Every day | ORAL | Status: DC
  Administered 2014-12-09 – 2014-12-12 (×4): 100 mg via ORAL
  Filled 2014-12-09 (×4): qty 1

## 2014-12-09 MED ORDER — SODIUM CHLORIDE 0.9 % IV SOLN
INTRAVENOUS | Status: DC
  Administered 2014-12-09: 1000 mL via INTRAVENOUS

## 2014-12-09 MED ORDER — INSULIN ASPART 100 UNIT/ML ~~LOC~~ SOLN
0.0000 [IU] | Freq: Three times a day (TID) | SUBCUTANEOUS | Status: DC
  Administered 2014-12-10: 8 [IU] via SUBCUTANEOUS
  Administered 2014-12-10: 3 [IU] via SUBCUTANEOUS
  Administered 2014-12-11: 8 [IU] via SUBCUTANEOUS
  Administered 2014-12-11 (×2): 5 [IU] via SUBCUTANEOUS
  Administered 2014-12-12: 8 [IU] via SUBCUTANEOUS

## 2014-12-09 MED ORDER — ALLOPURINOL 300 MG PO TABS
300.0000 mg | ORAL_TABLET | Freq: Every day | ORAL | Status: DC
  Filled 2014-12-09: qty 1

## 2014-12-09 MED ORDER — INSULIN ASPART 100 UNIT/ML ~~LOC~~ SOLN
0.0000 [IU] | Freq: Every day | SUBCUTANEOUS | Status: DC
  Administered 2014-12-09: 4 [IU] via SUBCUTANEOUS
  Administered 2014-12-10: 2 [IU] via SUBCUTANEOUS
  Administered 2014-12-11: 4 [IU] via SUBCUTANEOUS

## 2014-12-09 NOTE — Progress Notes (Addendum)
Subjective:   Ca stable at 12.5 No other issues Oncology seeing He says "MD to see this afternoon" Is wondering if [facility] will do his chemo vs here (followed at [facility])  06/19 0701 - 06/20 0700 In: 720 [P.O.:720] Out: -   Filed Weights   12/07/14 0505 12/07/14 2008 12/08/14 2131  Weight: 98 kg (216 lb 0.8 oz) 98.5 kg (217 lb 2.5 oz) 99.1 kg (218 lb 7.6 oz)     Physical Exam:  BP 131/62 mmHg  Pulse 78  Temp(Src) 98.9 F (37.2 C) (Oral)  Resp 18  Ht 5\' 8"  (1.727 m)  Wt 99.1 kg (218 lb 7.6 oz)  BMI 33.23 kg/m2  SpO2 96% GEN: NAD, obese, very pleasant and talkative Wearing O2 Sclerae not icteric CV: Regular S1S2 No S3. No rub PULM: clear without crackles or wheezes ABD: protuberant,soft, non-tender SKIN: no rashes/lesions EXT:2+ edema bilat LE's  Scheduled Meds: . heparin  5,000 Units Subcutaneous 3 times per day  . insulin aspart  0-5 Units Subcutaneous QHS  . insulin aspart  0-9 Units Subcutaneous TID WC  . insulin glargine  15 Units Subcutaneous QHS  . predniSONE  40 mg Oral Q breakfast  . sodium chloride  3 mL Intravenous Q12H      Recent Labs Lab 12/03/14 2104  12/07/14 0644 12/08/14 0539 12/09/14 0550  NA  --   < > 138 139 138  K  --   < > 3.6 3.7 3.9  CL  --   < > 99* 103 101  CO2  --   < > 29 30 29   GLUCOSE  --   < > 102* 128* 149*  BUN  --   < > 40* 34* 38*  CREATININE  --   < > 3.75* 3.57* 3.19*  CALCIUM 15.0*  < > 12.6* 12.5* 12.5*  PHOS 4.9*  --   --   --   --   < > = values in this interval not displayed.  Recent Labs Lab 12/03/14 1631  WBC 9.1  HGB 14.0  HCT 41.3  MCV 89.0  PLT 282   Background 8M with severe hypercalcemia, CKD4-5? with possible AKI, CT demonstrated retroperitoneal lymphadenopathy concerning for lymphoma. 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  A/P 1. Hypercalcemia 2/2 excessive endogenous 1,25 Vit D; Severe, admit at > 15.0 1. Rec'd Pamidronate 90mg  IV  6/16 2. Stopped calcitonin 6/17 after 72h (6 doses) 3. Calcium holding steady about 12.5 4. Holding on further IVF, weight up ?9lb as of 6/19 (no weight today 6/20) 5. Following volume status closely -- diuretics as needed - would need to use loop diuretic (no thiazides) 6. Etiology (PTH suppressed, PTHrP neg, 1,25 D elevated) 1. Driven by 4,76 OH excess likely related to lymphoma (dx + by bx) 7. Started prednisone 40mg /day today for 1,25 OH Vit D process  8. 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 Unsure of baseline, sees [place] [facility], told 'nearing dialysis' 2. Has continuous improvement in SCr with hydration and further improved today in face of stopping hydration 3. Cont to follow closely  4. Other electrolytes ok 5. 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 Daily weights, Daily Renal Panel, Strict I/Os, Avoid nephrotoxins (NSAIDs, judicious IV Contrast) 3. Extensive Retroperitoneal LAN 1. 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 TRH discussed with Heme-Onc - presume they will now see given lymphoma diagnosis (seen by PA -  says "MD" to come later today) 4. DM2 5. HTN 1. Hold Thiazide and ACEi 2. Follow 3. CCB likely next agent if needed  Jamal Maes, MD [place] Pager 12/09/2014, 11:19 AM

## 2014-12-09 NOTE — Progress Notes (Signed)
Patient CBG was taken at 1710, patient tray arrived at 1915 during report upon introduction patient was eating and was unable to get an accurate CBG, patient scheduled for lantus 15 units and sliding scale at 10pm.

## 2014-12-09 NOTE — Progress Notes (Signed)
Report called to Jenny Reichmann, Therapist, sports at [facility].  All questions answered. `

## 2014-12-09 NOTE — Consult Note (Signed)
[place]  Telephone:(336) [place]                                MR#: [id number]  DOB: March 15, 1946                       CSN#: [id number]  Referring MD: Dr. Sheliah Plane [facility]  Patient Care Team: Pcp Not In System as PCP - General  Reason for Consult: Non Hodgkins Lymphoma   GNO:IBBCW Billy Gray is a 69 y.o. male admitted on 6/15 with 2 week history of extreme fatigue and generalized weakness, weight loss, failure to thrive. He also complained of chronic shortness of breath and cough (patient is on O2 at home). Denies cardiac complaints. Denies nausea or vomiting, diarrhea or constipation. He has been recently diagnosed with renal failure as well, which is being managed by Nephrology while in hospital. Of note, he states that he had a history of renal cysts for at least 3 years, firs discovered at the [place].  He reports increased bilateral leg swelling over the last 2 weeks. Denies risk for Hepatitis or HIV. No confusion is reported. Patient denies any history or family history of lymphoma. Never had a bone marrow biopsy in the past.  CT of the abdomen and pelvis on 6/14 showed  Prominent retroperitoneal lymphadenopathy most likely to represent lymphoma. Multiple bilateral renal lesions likely representing cysts. No worrisome chest findings.  Patient underwent ultrasound guided core  biopsy on 6/16  of right retroperitoneal mass. There is a monoclonal population of B-cell with expression of CD10. 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 Flow cytometry([id number])  Shows abnormal Cells in gated population: 49 %. Phenotype of Abnormal Cells: CD19, CD20, CD22, CD10, HLA-Dr, Kappa. In addition, he was hypercalcemic on presentation, Ca at 15, requiring Aredia x1, and 6 doses of calcitonin, discontinued on 6/17, and steroids with minimal response response, today at 12.5 Oncology was  requested to see patient in consultation with recommendations regarding treatment options.        PMH:  Past Medical History  Diagnosis Date   COPD (chronic obstructive pulmonary disease)    Hypertension    Diabetes mellitus without complication    Chronic Kidney Disease     Surgeries: Back surgery, remote.   Allergies: No Known Allergies  Medications:  Scheduled Meds:  heparin  5,000 Units Subcutaneous 3 times per day   insulin aspart  0-5 Units Subcutaneous QHS   insulin aspart  0-9 Units Subcutaneous TID WC   insulin glargine  15 Units Subcutaneous QHS   predniSONE  40 mg Oral Q breakfast   sodium chloride  3 mL Intravenous Q12H   Continuous Infusions:  PRN Meds:.HYDROcodone-acetaminophen, zolpidem UGQ:[id number], zolpidem  ROS: Constitutional: Denies fevers, chills or abnormal night sweats Eyes: Denies blurriness of vision, double vision or watery eyes Ears, nose, mouth, throat, and face: Denies mucositis or sore throat Respiratory: Reports chronic cough, dyspnea, denies wheezes Cardiovascular: Denies palpitation, chest discomfort, he has bilateral lower extremity swelling Gastrointestinal: see HPI Skin: Denies abnormal skin rashes Lymphatics: Denies new visible lymphadenopathy or easy bruising Neurological:Denies numbness, tingling or new weaknesses Behavioral/Psych: Mood is stable, no new changes  All other systems were reviewed with the patient and are negative.   Family History:    History reviewed. No pertinent family history. except  for brother who died with gastric cancer No family history of hematological  disorders.  Social History: He is a retired Dealer. He is also a  [place] Veteran. May have been exposed to agent orange. He is married, 3 children in good health. He smoked until 3 years ago. He consumed 6 ppd beer or more until 3 years ago.  Physical Exam:  Filed Vitals:   12/09/14 0953  BP: 131/62  Pulse: 78  Temp:  98.9 F (37.2 C)  Resp: 18   Filed Weights   12/07/14 0505 12/07/14 2008 12/08/14 2131  Weight: 216 lb 0.8 oz (98 kg) 217 lb 2.5 oz (98.5 kg) 218 lb 7.6 oz (99.1 kg)   ECOG PERFORMANCE STATUS:2    GENERAL:alert, no distress and comfortable, weak appearing. SKIN: skin color, texture, turgor are normal, no rashes or significant lesions EYES: normal, conjunctiva are pink and non-injected, sclera clear OROPHARYNX:no exudate, no erythema and lips, buccal mucosa, and tongue with some brown discoloration NECK: supple, thyroid normal size, non-tender, without nodularity LYMPH:  no palpable lymphadenopathy in the cervical, supraclavicular, axillary or inguinal areas LUNGS:decreased breath sounds at the bases, normal breathing effort HEART:  Distant sounds, regular rate & rhythm and no murmurs and  2 + bilateral  lower extremity edema, left greater than right. ABDOMEN:abdomen soft, non-tender and normal bowel sounds, no mass, nontender Musculoskeletal:no cyanosis of digits and no clubbing. Well healed knee scar, swelling surrounding the left knee PSYCH: alert & oriented x 3 with fluent speech NEURO: no focal motor/sensory deficits   Labs:  CBC   Recent Labs Lab 12/03/14 1631  WBC 9.1  HGB 14.0  HCT 41.3  PLT 282  MCV 89.0  MCH 30.2  MCHC 33.9  RDW 14.0     CMP    Recent Labs Lab 12/03/14 2104  12/05/14 1042 12/06/14 0437 12/07/14 0644 12/08/14 0539 12/09/14 0550  NA  --   < > 139 138 138 139 138  K  --   < > 4.0 3.9 3.6 3.7 3.9  CL  --   < > 105 105 99* 103 101  CO2  --   < > 27 26 29 30 29   GLUCOSE  --   < > 78 95 102* 128* 149*  BUN  --   < > 57* 51* 40* 34* 38*  CREATININE  --   < > 4.56* 4.10* 3.75* 3.57* 3.19*  CALCIUM 15.0*  < > 12.9* 11.9* 12.6* 12.5* 12.5*  MG 1.5*  --   --   --   --   --   --   AST  --   --  24 20 23 17 18   ALT  --   --  16* 15* 16* 15* 16*  ALKPHOS  --   --  50 50 78 79 100  BILITOT  --   --  0.4 0.5 0.7 0.7 0.4  < > = values in  this interval not displayed.      Component Value Date/Time   BILITOT 0.4 12/09/2014 0550      Recent Labs Lab 12/05/14 0622  INR 1.19     Imaging Studies:  Ct Abdomen Pelvis Wo Contrast  12/03/2014   CLINICAL DATA:  Generalized fatigue and cough for a week. No appetite.  EXAM: CT CHEST, ABDOMEN AND PELVIS WITHOUT CONTRAST  TECHNIQUE: Multidetector CT imaging of the chest, abdomen and pelvis was performed following the standard protocol without IV contrast.  COMPARISON:  None.  FINDINGS: CT  CHEST FINDINGS  Normal heart size. Coronary artery calcifications. 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 Aortic calcification. No definite lymphadenopathy in the chest although non conscious examination limits evaluation of hilar lymph nodes. Esophagus is decompressed.  Mild linear atelectasis or fibrosis in the lung bases. No airspace disease or consolidation. No parenchymal mass lesion identified. Mild emphysematous changes in the upper lungs. No pleural effusions. No pneumothorax.  CT ABDOMEN AND PELVIS FINDINGS  The unenhanced appearance of the liver, spleen, gallbladder, pancreas, adrenal glands, is unremarkable. Kidneys appear atrophic without hydronephrosis. Multiple bilateral renal mass lesions probably representing cysts. Calcification of the aorta without aneurysm. There is extensive lymphadenopathy in the retroperitoneum involving predominantly the pericaval region and extending from the abdominal hiatus down into the upper pelvis. Conglomerate mass of lymph nodes in the pericaval region measures up to about 14.3 x 12.3 x 7.4 cm. Additional enlarged lymph nodes are also demonstrated in the periaortic regions. With the large lymph node mass displaces the aorta and partially surrounds the aorta. Inferior vena cava cannot be distinguished from the mass. This is likely to represent lymphoma although sarcoma or other retroperitoneal mass could also have this appearance. Stomach, small bowel, and  colon are not abnormally distended. No free air or free fluid in the abdomen. Mild infiltration in the mesenteries probably reactive. Abdominal wall musculature appears intact.  Pelvis: Prostate gland is mildly enlarged. Bladder wall is not thickened. Appendix is not identified. No free or loculated pelvic fluid collections. Small bilateral inguinal hernias containing fat.  Bones: Diffuse degenerative changes throughout the thoracic and lumbar spine. No vertebral compression deformities. No destructive bone lesions. Degenerative changes in the hips. Sacrum and pelvis appear intact.  IMPRESSION: No evidence of active pulmonary disease. 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 Recommend annual imaging followup by CTA or MRA. This recommendation follows 2010 ACCF/AHA/AATS/ACR/ASA/SCA/SCAI/SIR/STS/SVM Guidelines for the Diagnosis and Management of Patients with Thoracic Aortic Disease. Circulation. 2010; 121: O294-T654  Prominent retroperitoneal lymphadenopathy most likely to represent lymphoma. Multiple bilateral renal lesions likely representing cysts. Bilateral inguinal hernias containing fat.   Electronically Signed   By: Lucienne Capers M.D.   On: 12/03/2014 23:15   Dg Chest 2 View  12/03/2014   CLINICAL DATA:  Fever and tachycardia  EXAM: CHEST  2 VIEW  COMPARISON:  None.  FINDINGS: The lungs are mildly hyperexpanded. There is scarring in the right base. There is mild central peribronchial thickening. There is a nipple shadow on the right. There is no edema or consolidation. The heart size is normal. The pulmonary vascularity is within normal limits. There is atherosclerotic change in aorta. There is no adenopathy. There is degenerative change in the thoracic spine.  IMPRESSION: Mild scarring right base. No edema or consolidation. Lungs mildly hyperexpanded with mild central peribronchial thickening. Suspect a degree of chronic bronchitis.   Electronically Signed   By: Lowella Grip III M.D.   On:  12/03/2014 16:55   Dg Knee 1-2 Views Left  12/04/2014   CLINICAL DATA:  Medial left knee swelling at distal femur. Pain with weight-bearing. No injury.  EXAM: LEFT KNEE - 1-2 VIEW  COMPARISON:  None.  FINDINGS: Examination demonstrates moderate osteoarthritic change involving the lateral compartment and to a lesser extent involving the medial compartment and patellofemoral joints. No evidence of fracture or dislocation. Suggestion of a significant joint effusion. Mild calcified plaque over the femoral artery.  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 Significant joint effusion.  Electronically Signed   By: Marin Olp M.D.   On: 12/04/2014 13:41   Ct Chest Wo Contrast  12/03/2014   CLINICAL DATA:  Generalized fatigue and cough for a week. No appetite.  EXAM: CT CHEST, ABDOMEN AND PELVIS WITHOUT CONTRAST  TECHNIQUE: Multidetector CT imaging of the chest, abdomen and pelvis was performed following the standard protocol without IV contrast.  COMPARISON:  None.  FINDINGS: CT CHEST FINDINGS  Normal heart size. Coronary artery calcifications. 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 Aortic calcification. No definite lymphadenopathy in the chest although non conscious examination limits evaluation of hilar lymph nodes. Esophagus is decompressed.  Mild linear atelectasis or fibrosis in the lung bases. No airspace disease or consolidation. No parenchymal mass lesion identified. Mild emphysematous changes in the upper lungs. No pleural effusions. No pneumothorax.  CT ABDOMEN AND PELVIS FINDINGS  The unenhanced appearance of the liver, spleen, gallbladder, pancreas, adrenal glands, is unremarkable. Kidneys appear atrophic without hydronephrosis. Multiple bilateral renal mass lesions probably representing cysts. Calcification of the aorta without aneurysm. There is extensive lymphadenopathy in the retroperitoneum involving predominantly the pericaval  region and extending from the abdominal hiatus down into the upper pelvis. Conglomerate mass of lymph nodes in the pericaval region measures up to about 14.3 x 12.3 x 7.4 cm. Additional enlarged lymph nodes are also demonstrated in the periaortic regions. With the large lymph node mass displaces the aorta and partially surrounds the aorta. Inferior vena cava cannot be distinguished from the mass. This is likely to represent lymphoma although sarcoma or other retroperitoneal mass could also have this appearance. Stomach, small bowel, and colon are not abnormally distended. No free air or free fluid in the abdomen. Mild infiltration in the mesenteries probably reactive. Abdominal wall musculature appears intact.  Pelvis: Prostate gland is mildly enlarged. Bladder wall is not thickened. Appendix is not identified. No free or loculated pelvic fluid collections. Small bilateral inguinal hernias containing fat.  Bones: Diffuse degenerative changes throughout the thoracic and lumbar spine. No vertebral compression deformities. No destructive bone lesions. Degenerative changes in the hips. Sacrum and pelvis appear intact.  IMPRESSION: No evidence of active pulmonary disease. 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 Recommend annual imaging followup by CTA or MRA. This recommendation follows 2010 ACCF/AHA/AATS/ACR/ASA/SCA/SCAI/SIR/STS/SVM Guidelines for the Diagnosis and Management of Patients with Thoracic Aortic Disease. Circulation. 2010; 121: Z610-R604  Prominent retroperitoneal lymphadenopathy most likely to represent lymphoma. Multiple bilateral renal lesions likely representing cysts. Bilateral inguinal hernias containing fat.   Electronically Signed   By: Lucienne Capers M.D.   On: 12/03/2014 23:15   Ct Biopsy  12/05/2014   CLINICAL DATA:  Confluent right retroperitoneal mass.  EXAM: CT GUIDED CORE BIOPSY OF RIGHT RETROPERITONEAL MASS  ANESTHESIA/SEDATION: Intravenous Fentanyl and Versed were administered as  conscious sedation during continuous cardiorespiratory monitoring by the radiology RN, with a total moderate sedation time of 10 minutes.  PROCEDURE: The procedure risks, benefits, and alternatives were explained to the patient. Questions regarding the procedure were encouraged and answered. The patient understands and consents to the procedure.  Patient placed prone. Select axial scans through the abdomen obtained. The lesion was localized and an appropriate skin entry site determined and marked.  The operative field was prepped with Betadinein a sterile fashion, and a sterile drape was applied covering the operative field. A sterile gown and sterile gloves were used for the procedure. Local anesthesia was provided with 1% Lidocaine.  Under CT fluoroscopic guidance, a 17 gauge trocar needle was  advanced to the margin of the lesion. Once needle tip position was confirmed, coaxial 18-gauge core biopsy samples were obtained, submitted in saline to surgical pathology. The guide needle was removed. The patient tolerated the procedure well.  COMPLICATIONS: None immediate  FINDINGS: Limited scans again confirm or confluent right retroperitoneal mass abutting the psoas musculature and aorta, and obscuring the IVC. CT-guided core biopsy samples were obtained without complication.  IMPRESSION: 1. Technically successful CT-guided core biopsy of right retroperitoneal mass.   Electronically Signed   By: Lucrezia Europe M.D.   On: 12/05/2014 13:46    Diagnosis Soft Tissue Needle Core Biopsy, right retroperitoneal - FOLLICULAR LYMPHOMA. - SEE ONCOLOGY TABLE. Microscopic Comment LYMPHOMA Histologic type: Non-Hodgkin B-cell lymphoma: Follicular lymphoma. Grade (if applicable): Low grade, see comment. Flow cytometry: [id number] with monoclonal B-cell population with expression of CD10. Immunohistochemical stains: CD20, CD79a, CD3, CD5, CD10, bcl-6, bcl-2, CD21, and Ki-67. Touch preps/imprints: N/A. Comments: Core biopsies  reveal a monotonous proliferation of small lymphocytes with irregular/cleaved nuclei. There are scattered larger cells, but not diffuse areas. There are a few visible follicles. While there are too few follicles for accurate grading, the overall population consists of small centrocyte like cells, consistent with a low grade process. Immunohistochemistry reveals the lymphocytes are positive for CD20, CD79a, CD10, bcl-6, and bcl-2. CD21 reveals a few scattered neoplastic follicles that are positive for CD10, bcl-6 and bcl-2. Ki-67 reveals a low proliferation rate (10%), again, consistent with a low grade process. CD3 and CD5 reveal abundant admixed T-cells. Flow cytometry ([id number]) reveals a monoclonal B-cell population with CD10. 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 While accurate grading is hampered by scant follicles, the process appears low grade (grade 1-2).    Vicente Males MD Pathologist, Electronic Signature     Patient: Billy Gray, Billy Gray Collected: 12/05/2014 Client: [facility] Accession: [id number] Received: 12/05/2014 D. Arne Cleveland DOB: Dec 25, 1945 Age: 79 Gender: M Reported: 12/09/2014 [address] Patient Ph: [phone] MRN #: [id number] [place], [place] [address] Visit #: [id number] Chart #: Phone:  Fax: CC: Eleonore Chiquito, MD FLOW CYTOMETRY REPORT INTERPRETATION Interpretation Tissue-Flow Cytometry - MONOCLONAL B-CELL POPULATION IDENTIFIED. Diagnosis Comment: There is a monoclonal population of B-cell with expression of CD10. 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 See corresponding biopsy ([id number]) for further details. Vicente Males MD Pathologist, Electronic Signature (Case signed 12/09/2014) GROSS AND MICROSCOPIC INFORMATION Source Tissue-Flow Cytometry Microscopic Gated population: Flow cytometric immunophenotyping is performed using antibiodies to the antigens listed in  the table below. Electronic gates are placed around a cell cluster displaying light scatter properties corresponding to lymphocytes. - Abnormal Cells in gated population: 49 % - Phenotype of Abnormal Cells: CD19, CD20, CD22, CD10, HLA-Dr, Kappa 1 of 2 FINAL for [place], [place] [phone]) Specimen Table Lymphoid Associated Myeloid Associated Misc. Associated CD2 neg CD19 pos CD11c ND CD45 ND CD3 neg CD20 pos CD13 ND HLA-DR pos CD4 neg CD21 neg CD14 ND CD10 pos CD5 neg CD22 pos CD15 ND CD56/16 ND CD7 neg CD23 neg CD33 ND ZAP70 ND CD8 neg CD103 ND MPO ND CD34 ND CD25 ND FMC7 ND CD117 ND CD52 ND sKappa pos CD38 ND sLambda neg 7AAD tested cKappa ND cLambda ND    A/P: 69 y.o. male   Newly diagnosed Non Hodgkins Lymphoma CT guided core biopsy of the retroperitoneal conglomerate on 6/16 showed monoclonal population of B-cell with expression of CD10. These findings are consistent with a non-Hodgkin B-cell lymphoma of germinal  center origin.  Flow cytometry([id number])  shows  49 %. Phenotype of Abnormal Cells: CD19, CD20, CD22, CD10, HLA-Dr, Kappa. Recommend initiation of chemotherapy as soon as possible, for which he will need transfer to [facility]. Dr. Darrick Meigs has been notified and he agrees.  Will Check  LDH today. He may need a PET scan as outpatient  Hypercalcemia of malignancy  He received Aredia on 6/16 and 6 doses of calcitonin, discontinued on 6/17 and steroids, gentle hydration, with minimal response  today at 12.5 Will continue to monitor, hopefully these values to improve once chemo treatments are initiated.   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 Moshe Cipro to follow at [facility]  DVT prophylaxis On Heparin  Full Code  Other medical issues including diabetes and COPD as per primary team   [facility] E, PA-C 12/09/2014 10:00 AM  Mr. Vahle was interviewed and examined. I reviewed the CT images with Mr. Litsey  and his wife. He has been diagnosed with non-Hodgkin's lymphoma involving a large retroperitoneal mass. He was admitted with symptomatic hypercalcemia. The lymphoma appears to be "low-grade "based on the core biopsy findings, but it is possible there is a higher grade component present within the retroperitoneal mass or elsewhere. In my experience it is unusual for patients with low-grade lymphoma to present with this degree of hypercalcemia.  I recommend initiation of systemic therapy within the next one to 2 days since he has persistent hypercalcemia. We will check his LDH and creatinine on 12/10/2014 and decide on a systemic therapy regimen, most likely CVP-rituximab.  Recommendations: 1. Transfer to the third for oncology unit at [facility] in anticipation of beginning systemic chemotherapy within the next one to 2 days 2. Begin allopurinol 3. Check LDH 4. Check hepatitis serologies prior to rituximab therapy

## 2014-12-09 NOTE — Progress Notes (Signed)
Pt's dinner try did not come up after being ordered, it was brought up at 1915, night nurse, O'Neill notified he;s tray was late and she will give his insulin after he's done eating. [id number], Novolog 5 units.. Family at bedside.

## 2014-12-09 NOTE — Progress Notes (Signed)
[facility] PROGRESS NOTE  Sayan Aldava SVX:[id number] DOB: [birth year] DOA: 12/03/2014 PCP: Pcp Not In System  Assessment/Plan:  1. Hypercalcemia- patient came with calcium of 15.0, this morning calcium was 12.5, after started IV hydration with normal saline. Nephrology has been consulted, IV pamidronate has been started. Patient received 6 doses of calcitonin. 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 Additional enlarged lymph nodes are also demonstrated in the periaortic regions.  S/p Biopsy done from the retroperitoneal lymph nodes today per IR. biopsy report came back with follicular lymphoma. Oncology consulted, Dr Renelda Mom saw the patient and recommended to transfer to [place] for chemotherapy  3. Left knee swelling- improved, x-ray of left knee reveals significant joint effusion. Patient had arthrocentesis 2 weeks ago at [facility], will follow [facility] as outpatient 4. CKD stage IV- patient's creatinine is improving, today creatinine is 3.75 patient is currently not on hemodialysis, was told by [facility] [place] that he is nearing dialysis. Nephrology is following and managing the fluid balance. HCTZ and ACE inhibitor have been held per nephrology.  5. Diabetes mellitus- continue sliding scale insulin, Lantus. Blood glucose inow elevated as he is on prednisone, will change the sliding scale to moderate. 6. Thoracic aortic aneurysm- stable ,CT chest shows 4.1 cm of ascending thoracic aortic aneurysm, patient will need yearly CT chest to assess the growth. I discussed with patient and his wife at bedside. 7. Hypertension- patient blood pressure is well controlled, both HCTZ as well as ACE inhibitor are on hold.  Code Status: Full code Family Communication: Discussed with patient's wife at bedside Disposition Plan: Will transfer to [facility], discussed with Dr  Rama   Consultants:  Nephrology  Procedures:  None  Antibiotics:  None  HPI/Subjective: 69 y.o. male who presents to the ED with c/o fatigue, generalized weakness, cough, decreased PO intake for the past 2 weeks or so. Taking small amount of tums or peptobismol recently. Has h/o kidney problems, diagnosed 3 months ago with CKD at the [place] system and told his "creatinine was near needing dialysis". Chronic cough, on 3L o2 at home for COPD.   Today denies any pain or shortness of breath Biopsy result came back as follicular lymphoma, oncology consulted, was seen by Dr Benay Spice.   Objective: Filed Vitals:   12/09/14 0953  BP: 131/62  Pulse: 78  Temp: 98.9 F (37.2 C)  Resp: 18    Intake/Output Summary (Last 24 hours) at 12/09/14 1445 Last data filed at 12/09/14 1300  Gross per 24 hour  Intake    720 ml  Output      0 ml  Net    720 ml   Filed Weights   12/07/14 0505 12/07/14 2008 12/08/14 2131  Weight: 98 kg (216 lb 0.8 oz) 98.5 kg (217 lb 2.5 oz) 99.1 kg (218 lb 7.6 oz)    Exam:   General:  Appears in no acute distress  Cardiovascular: S1-S2 is regular, no murmurs rubs or gallops  Respiratory: Clear to auscultation bilaterally  Abdomen: Soft, nontender, no organomegaly  Musculoskeletal: Mild swelling noted in the left knee   Data Reviewed: Basic Metabolic Panel:  Recent Labs Lab 12/03/14 2104  12/05/14 1042 12/06/14 0437 12/07/14 0644 12/08/14 0539 12/09/14 0550  NA  --   < > 139 138 138 139 138  K  --   < > 4.0 3.9 3.6 3.7 3.9  CL  --   < > 105 105 99* 103 101  CO2  --   < > 27 26 29 30 29   GLUCOSE  --   < > 78 95 102* 128* 149*  BUN  --   < > 57* 51* 40* 34* 38*  CREATININE  --   < > 4.56* 4.10* 3.75* 3.57* 3.19*  CALCIUM 15.0*  < > 12.9* 11.9* 12.6* 12.5* 12.5*  MG 1.5*  --   --   --   --   --   --   PHOS 4.9*  --   --   --   --   --   --   < > = values in this interval not displayed. Liver Function Tests:  Recent Labs Lab  12/05/14 1042 12/06/14 0437 12/07/14 0644 12/08/14 0539 12/09/14 0550  AST 24 20 23 17 18   ALT 16* 15* 16* 15* 16*  ALKPHOS 50 50 78 79 100  BILITOT 0.4 0.5 0.7 0.7 0.4  PROT 5.8* 5.6* 6.8 6.1* 6.2*  ALBUMIN 2.9* 2.8* 3.3* 2.9* 3.0*   CBC:  Recent Labs Lab 12/03/14 1631  WBC 9.1  HGB 14.0  HCT 41.3  MCV 89.0  PLT 282    Recent Labs  12/03/14 1631  BNP 102.8*    ProBNP (last 3 results) No results for input(s): PROBNP in the last 8760 hours.  CBG:  Recent Labs Lab 12/08/14 1139 12/08/14 1700 12/08/14 2130 12/09/14 0747 12/09/14 1111  GLUCAP 280* 202* 231* 151* 293*    No results found for this or any previous visit (from the past 240 hour(s)).   Studies: Ct Abdomen Pelvis Wo Contrast  12/03/2014   CLINICAL DATA:  Generalized fatigue and cough for a week. No appetite.  EXAM: CT CHEST, ABDOMEN AND PELVIS WITHOUT CONTRAST  TECHNIQUE: Multidetector CT imaging of the chest, abdomen and pelvis was performed following the standard protocol without IV contrast.  COMPARISON:  None.  FINDINGS: CT CHEST FINDINGS  Normal heart size. Coronary artery calcifications. 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 Aortic calcification. No definite lymphadenopathy in the chest although non conscious examination limits evaluation of hilar lymph nodes. Esophagus is decompressed.  Mild linear atelectasis or fibrosis in the lung bases. No airspace disease or consolidation. No parenchymal mass lesion identified. Mild emphysematous changes in the upper lungs. No pleural effusions. No pneumothorax.  CT ABDOMEN AND PELVIS FINDINGS  The unenhanced appearance of the liver, spleen, gallbladder, pancreas, adrenal glands, is unremarkable. Kidneys appear atrophic without hydronephrosis. Multiple bilateral renal mass lesions probably representing cysts. Calcification of the aorta without aneurysm. There is extensive lymphadenopathy in the retroperitoneum involving predominantly the pericaval region and  extending from the abdominal hiatus down into the upper pelvis. Conglomerate mass of lymph nodes in the pericaval region measures up to about 14.3 x 12.3 x 7.4 cm. Additional enlarged lymph nodes are also demonstrated in the periaortic regions. With the large lymph node mass displaces the aorta and partially surrounds the aorta. Inferior vena cava cannot be distinguished from the mass. This is likely to represent lymphoma although sarcoma or other retroperitoneal mass could also have this appearance. Stomach, small bowel, and colon are not abnormally distended. No free air or free fluid in the abdomen. Mild infiltration in the mesenteries probably reactive. Abdominal wall musculature appears intact.  Pelvis: Prostate gland is mildly enlarged. Bladder wall is not thickened. Appendix is not identified. No free or loculated pelvic fluid collections. Small bilateral inguinal hernias containing fat.  Bones: Diffuse degenerative changes throughout the thoracic and lumbar spine. No vertebral compression deformities. No destructive bone lesions. Degenerative changes in the hips. Sacrum and pelvis appear intact.  IMPRESSION: No evidence of active pulmonary disease. 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 Recommend annual imaging followup by CTA or MRA. This recommendation follows 2010 ACCF/AHA/AATS/ACR/ASA/SCA/SCAI/SIR/STS/SVM Guidelines for the Diagnosis and Management of Patients with Thoracic Aortic Disease. Circulation. 2010; 121: Z610-R604  Prominent retroperitoneal lymphadenopathy most likely to represent lymphoma. Multiple bilateral renal lesions likely representing cysts. Bilateral inguinal hernias containing fat.   Electronically Signed   By: Lucienne Capers M.D.   On: 12/03/2014 23:15   Dg Chest 2 View  12/03/2014   CLINICAL DATA:  Fever and tachycardia  EXAM: CHEST  2 VIEW  COMPARISON:  None.  FINDINGS: The lungs are mildly hyperexpanded. There is scarring in the right base. There is mild central  peribronchial thickening. There is a nipple shadow on the right. There is no edema or consolidation. The heart size is normal. The pulmonary vascularity is within normal limits. There is atherosclerotic change in aorta. There is no adenopathy. There is degenerative change in the thoracic spine.  IMPRESSION: Mild scarring right base. No edema or consolidation. Lungs mildly hyperexpanded with mild central peribronchial thickening. Suspect a degree of chronic bronchitis.   Electronically Signed   By: Lowella Grip III M.D.   On: 12/03/2014 16:55   Dg Knee 1-2 Views Left  12/04/2014   CLINICAL DATA:  Medial left knee swelling at distal femur. Pain with weight-bearing. No injury.  EXAM: LEFT KNEE - 1-2 VIEW  COMPARISON:  None.  FINDINGS: Examination demonstrates moderate osteoarthritic change involving the lateral compartment and to a lesser extent involving the medial compartment and patellofemoral joints. No evidence of fracture or dislocation. Suggestion of a significant joint effusion. Mild calcified plaque over the femoral artery.  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 Significant joint effusion.   Electronically Signed   By: Marin Olp M.D.   On: 12/04/2014 13:41     Scheduled Meds: . heparin  5,000 Units Subcutaneous 3 times per day  . insulin aspart  0-5 Units Subcutaneous QHS  . insulin aspart  0-9 Units Subcutaneous TID WC  . insulin glargine  15 Units Subcutaneous QHS  . predniSONE  40 mg Oral Q breakfast  . sodium chloride  3 mL Intravenous Q12H   Continuous Infusions:    Principal Problem:   Hypercalcemia Active Problems:   Acute on chronic kidney failure   HTN (hypertension)   DM2 (diabetes mellitus, type 2)   Retroperitoneal lymphadenopathy    Time spent: *25 minutes    [facility] [facility] Pager [phone]. If 7PM-7AM, please contact night-coverage at [email or web address], password [facility] 12/09/2014,  2:45 PM  LOS: 6 days

## 2014-12-10 ENCOUNTER — Inpatient Hospital Stay (HOSPITAL_COMMUNITY): Payer: Medicare Other

## 2014-12-10 DIAGNOSIS — J9610 Chronic respiratory failure, unspecified whether with hypoxia or hypercapnia: Secondary | ICD-10-CM

## 2014-12-10 DIAGNOSIS — E1140 Type 2 diabetes mellitus with diabetic neuropathy, unspecified: Secondary | ICD-10-CM

## 2014-12-10 DIAGNOSIS — J441 Chronic obstructive pulmonary disease with (acute) exacerbation: Secondary | ICD-10-CM

## 2014-12-10 DIAGNOSIS — J449 Chronic obstructive pulmonary disease, unspecified: Secondary | ICD-10-CM

## 2014-12-10 DIAGNOSIS — C8290 Follicular lymphoma, unspecified, unspecified site: Secondary | ICD-10-CM

## 2014-12-10 LAB — GLUCOSE, CAPILLARY
GLUCOSE-CAPILLARY: 177 mg/dL — AB (ref 65–99)
Glucose-Capillary: 277 mg/dL — ABNORMAL HIGH (ref 65–99)
Glucose-Capillary: 238 mg/dL — ABNORMAL HIGH (ref 65–99)

## 2014-12-10 LAB — RENAL FUNCTION PANEL
ANION GAP: 10 (ref 5–15)
Albumin: 3.3 g/dL — ABNORMAL LOW (ref 3.5–5.0)
BUN: 44 mg/dL — AB (ref 6–20)
CO2: 29 mmol/L (ref 22–32)
CREATININE: 3.11 mg/dL — AB (ref 0.61–1.24)
Calcium: 11.8 mg/dL — ABNORMAL HIGH (ref 8.9–10.3)
Chloride: 101 mmol/L (ref 101–111)
GFR calc Af Amer: 22 mL/min — ABNORMAL LOW (ref >60)
GFR, EST NON AFRICAN AMERICAN: 19 mL/min — AB (ref >60)
Glucose, Bld: 102 mg/dL — ABNORMAL HIGH (ref 65–99)
POTASSIUM: 4 mmol/L (ref 3.5–5.1)
Phosphorus: 4.8 mg/dL — ABNORMAL HIGH (ref 2.5–4.6)
Sodium: 140 mmol/L (ref 135–145)

## 2014-12-10 LAB — URIC ACID: Uric Acid, Serum: 7.1 mg/dL (ref 4.4–7.6)

## 2014-12-10 MED ORDER — METHYLPREDNISOLONE SODIUM SUCC 125 MG IJ SOLR
60.0000 mg | Freq: Four times a day (QID) | INTRAMUSCULAR | Status: DC
  Administered 2014-12-10 – 2014-12-12 (×8): 60 mg via INTRAVENOUS
  Filled 2014-12-10 (×11): qty 0.96

## 2014-12-10 MED ORDER — INSULIN GLARGINE 100 UNIT/ML ~~LOC~~ SOLN: 10.0000 [IU] | Freq: Every day | SUBCUTANEOUS | Status: DC

## 2014-12-10 MED ORDER — IPRATROPIUM-ALBUTEROL 0.5-2.5 (3) MG/3ML IN SOLN
3.0000 mL | Freq: Four times a day (QID) | RESPIRATORY_TRACT | Status: DC
  Administered 2014-12-10: 3 mL via RESPIRATORY_TRACT

## 2014-12-10 MED ORDER — BUDESONIDE 0.5 MG/2ML IN SUSP
0.5000 mg | Freq: Two times a day (BID) | RESPIRATORY_TRACT | Status: DC
  Administered 2014-12-10 – 2014-12-12 (×5): 0.5 mg via RESPIRATORY_TRACT
  Filled 2014-12-10 (×5): qty 2

## 2014-12-10 MED ORDER — AMLODIPINE BESYLATE 5 MG PO TABS
5.0000 mg | ORAL_TABLET | Freq: Every day | ORAL | Status: DC
  Administered 2014-12-10 – 2014-12-12 (×3): 5 mg via ORAL
  Filled 2014-12-10 (×3): qty 1

## 2014-12-10 MED ORDER — ALBUTEROL SULFATE (2.5 MG/3ML) 0.083% IN NEBU
2.5000 mg | INHALATION_SOLUTION | Freq: Four times a day (QID) | RESPIRATORY_TRACT | Status: DC | PRN
  Filled 2014-12-10: qty 3

## 2014-12-10 MED ORDER — SODIUM CHLORIDE 0.9 % IV SOLN
INTRAVENOUS | Status: DC
  Administered 2014-12-10: 14:00:00 via INTRAVENOUS

## 2014-12-10 MED ORDER — ARFORMOTEROL TARTRATE 15 MCG/2ML IN NEBU
15.0000 ug | INHALATION_SOLUTION | Freq: Two times a day (BID) | RESPIRATORY_TRACT | Status: DC
  Administered 2014-12-10 – 2014-12-12 (×5): 15 ug via RESPIRATORY_TRACT
  Filled 2014-12-10 (×7): qty 2

## 2014-12-10 MED ORDER — IPRATROPIUM-ALBUTEROL 0.5-2.5 (3) MG/3ML IN SOLN
3.0000 mL | Freq: Three times a day (TID) | RESPIRATORY_TRACT | Status: DC
Start: 2014-12-11 — End: 2014-12-12
  Administered 2014-12-11 – 2014-12-12 (×4): 3 mL via RESPIRATORY_TRACT
  Filled 2014-12-10 (×4): qty 3

## 2014-12-10 MED ORDER — IPRATROPIUM-ALBUTEROL 0.5-2.5 (3) MG/3ML IN SOLN
3.0000 mL | RESPIRATORY_TRACT | Status: DC
  Administered 2014-12-10 (×3): 3 mL via RESPIRATORY_TRACT
  Filled 2014-12-10 (×3): qty 3

## 2014-12-10 MED ORDER — INSULIN GLARGINE 100 UNIT/ML ~~LOC~~ SOLN
15.0000 [IU] | Freq: Every day | SUBCUTANEOUS | Status: DC
  Administered 2014-12-10 – 2014-12-11 (×2): 15 [IU] via SUBCUTANEOUS
  Filled 2014-12-10 (×2): qty 0.15

## 2014-12-10 MED ORDER — INSULIN ASPART 100 UNIT/ML ~~LOC~~ SOLN
3.0000 [IU] | Freq: Three times a day (TID) | SUBCUTANEOUS | Status: DC
Start: 2014-12-10 — End: 2014-12-12
  Administered 2014-12-10 – 2014-12-12 (×6): 3 [IU] via SUBCUTANEOUS

## 2014-12-10 NOTE — Progress Notes (Signed)
Subjective:   Ca down to 11.8- creatinine continues to trend down slowly   moved to [facility] in preparation for CVP and rituximab Said did not do today because he had SOB- he says is improving with breathing treatments  06/20 0701 - 06/21 0700 In: 720 [P.O.:720] Out: 2 [Urine:1; Stool:1]  Filed Weights   12/07/14 0505 12/07/14 2008 12/08/14 2131  Weight: 98 kg (216 lb 0.8 oz) 98.5 kg (217 lb 2.5 oz) 99.1 kg (218 lb 7.6 oz)     Physical Exam:  BP 149/75 mmHg  Pulse 84  Temp(Src) 97.7 F (36.5 C) (Oral)  Resp 20  Ht 5\' 8"  (1.727 m)  Wt 99.1 kg (218 lb 7.6 oz)  BMI 33.23 kg/m2  SpO2 99% GEN: NAD, obese, very pleasant and talkative Wearing O2 Sclerae not icteric CV: Regular S1S2 No S3. No rub PULM: clear without crackles or wheezes ABD: protuberant,soft, non-tender SKIN: no rashes/lesions EXT:2+ edema bilat LE's  Scheduled Meds: . allopurinol  100 mg Oral Daily  . amLODipine  5 mg Oral Daily  . arformoterol  15 mcg Nebulization BID  . budesonide (PULMICORT) nebulizer solution  0.5 mg Nebulization BID  . heparin  5,000 Units Subcutaneous 3 times per day  . insulin aspart  0-15 Units Subcutaneous TID WC  . insulin aspart  0-5 Units Subcutaneous QHS  . insulin aspart  3 Units Subcutaneous TID WC  . insulin glargine  15 Units Subcutaneous QHS  . ipratropium-albuterol  3 mL Nebulization Q4H  . methylPREDNISolone (SOLU-MEDROL) injection  60 mg Intravenous Q6H  . sodium chloride  3 mL Intravenous Q12H   . sodium chloride 50 mL/hr at 12/10/14 1200    Recent Labs Lab 12/03/14 2104  12/08/14 0539 12/09/14 0550 12/10/14 0404  NA  --   < > 139 138 140  K  --   < > 3.7 3.9 4.0  CL  --   < > 103 101 101  CO2  --   < > 30 29 29   GLUCOSE  --   < > 128* 149* 102*  BUN  --   < > 34* 38* 44*  CREATININE  --   < > 3.57* 3.19* 3.11*  CALCIUM 15.0*  < > 12.5* 12.5* 11.8*  PHOS 4.9*  --   --   --  4.8*  < > = values in this interval not displayed.  Recent Labs Lab 12/03/14 1631   WBC 9.1  HGB 14.0  HCT 41.3  MCV 89.0  PLT 282   Background 59M with severe hypercalcemia, CKD4-5? with possible AKI, CT demonstrated retroperitoneal lymphadenopathy concerning for lymphoma. 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  A/P 1. Hypercalcemia 2/2 excessive endogenous 1,25 Vit D; Severe, admit at > 15.0 1. Rec'd Pamidronate 90mg  IV 6/16 2. Stopped calcitonin 6/17 after 72h (6 doses) 3. Calcium responding 4. Be cautious with IVF as weight is up  5. Following volume status closely -- diuretics as needed - would need to use loop diuretic (no thiazides) 6. Etiology (PTH suppressed, PTHrP neg, 1,25 D elevated) 1. Driven by 3,22 OH excess likely related to lymphoma (dx + by bx) 7. Started prednisone 40mg /day today for 1,25 OH Vit D process   2. CKD4-5 1. Unsure of baseline, sees [place] [facility], told 'nearing dialysis' 2. Has continuous improvement in SCr with hydration and treatment of hypercalcemia 3. Cont to follow closely  4. Other electrolytes ok 5. Numerous cysts on CT, suspect acquired cystic  disease 6. Daily weights, Daily Renal Panel, Strict I/Os, Avoid nephrotoxins (NSAIDs, judicious IV Contrast) 7. Anemia due to CKD is not an issue 3. Extensive Retroperitoneal LAN 1. 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 TRH discussed with Heme-Onc - presume they will now see given lymphoma diagnosis (seen by PA - says "MD" to come later today) 4. DM2 5. HTN 1. Hold Thiazide and ACEi 2. Follow 3. CCB likely next agent if needed- amlodipine started today - hold on diuretics due to them possibly making calc or creatinine worse- allow him to mobilize fluid but this could be the reason BP is up for now- be cautious  Tea Collums A   12/10/2014, 12:35 PM

## 2014-12-10 NOTE — Care Management Note (Signed)
Case Management Note  Patient Details  Name: Billy Gray MRN: [id number] Date of Birth: September 25, 1945  Subjective/Objective:               CM following for progression and d/c planning.     Action/Plan: 12/09/2014 Met with pt and wife , both are asking about pt VA benefit for this hospitalization. VA is not listed as a payor for this pt , this CM called adm/finance and learned that pt did have benefits however his primary insurance is Medicare. [name] states that he is 100% disabled. Call placed to [facility] and call was returned @ 4:30pm . Per Joey at the [facility] this pt is 90% disabled and the [place] benefit is his secondary coverage. This CM updated Joey on this pt status and plan to transfer this pt to [facility] for oncology followup and care at the cancer center. Transfer of the pt to a [place] center was not suggested at this time. Pt and wife informed of VA contact and info was faxed to [place] as requested. This included, H&P, consults , transfer summary and demographics.   Expected Discharge Date:  12/10/14               Expected Discharge Plan:  Home/Self Care  In-House Referral:  NA  Discharge planning Services  NA  Post Acute Care Choice:  NA Choice offered to:  NA  DME Arranged:    DME Agency:     HH Arranged:    HH Agency:     Status of Service:  Completed, signed off  Medicare Important Message Given:  Yes Date Medicare IM Given:  12/06/14 Medicare IM give by:  Demetrios Isaacs RN MPH, case manager, [phone] Date Additional Medicare IM Given:   12/09/2014 Additional Medicare Important Message give by:   Jasmine Pang RN MPH, case manager, [phone]  If discussed at [place] of Stay Meetings, dates discussed:    Additional Comments:  Adron Bene, RN 12/10/2014, 11:33 AM

## 2014-12-10 NOTE — Progress Notes (Addendum)
[facility] PROGRESS NOTE  Billy Gray VPX:[id number] DOB: [birth year] DOA: 12/03/2014 PCP: Pcp Not In System  Brief Summary  69 y.o. male who with CKD, T2DM, HTN, and COPD on 3L O2 who presented to the ED with c/o fatigue, generalized weakness, cough, decreased PO intake for two weeks prior to admission. He had been taking small amounts of tums or peptobismol. He was diagnosed with kidney problems at the [place] about three months prior to admission and told his "creatinine was near needing dialysis". He had also had unexplained weight loss for the last three months.  He was found to have hypercalcemia and an elevated creatinine.   He was started on IVF, calcitonin, and pamidronate for his hypercalemia.  His calcium and creatinine trended down.  Work up for etiology of hypercalcemia demonstrated retroperitoneal lymphadenopathy.  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  Nephrology and Oncology have been following.  He was transferred to [facility] to start chemotherapy.  LDH was normal.  PET scan will be obtained as outpatient.  Plan to start CVP and rituximab.     Assessment/Plan  1. Hypercalcemia- patient came with calcium of 15.0, this morning calcium is 11.8 after started IV hydration with normal saline. Nephrology has been consulted, IV pamidronate has been started. Patient received 6 doses of calcitonin. 2. Non-Hodgkin's B-cell lymphoma- CT abdomen revealed retroperitoneal lymphadenopathy, Conglomerate mass of lymph nodes in the pericaval region measures up to about 14.3 x 12.3 x 7.4 cm. Additional enlarged lymph nodes are also demonstrated in the periaortic regions. S/p Biopsy done from the retroperitoneal lymph nodes came back with follicular lymphoma.  -  Appreciate oncology assistance -  Hep B negative -  Plan to start chemotherapy  3. Left knee swelling- improved, x-ray of left knee revealed  significant joint effusion. Patient had arthrocentesis 2 weeks ago at [facility], will follow [facility] as outpatient 4. CKD stage IV- patient's creatinine is improving, today creatinine is 3.11  -  Nephrology is following and managing the fluid balance.  -  HCTZ and ACE inhibitor have been held  -  Renally dose medications and minimize nephrotoxins 5. Diabetes mellitus- CBG elevated -  Check A1c -  Not starting chemo yet and increasing steroids so continue lantus to 15 units -   Increased SSI to moderate dose yesterday -  Add standing meal time insulin 3 units aspart 6. Thoracic aortic aneurysm- stable, CT chest shows 4.1 cm of ascending thoracic aortic aneurysm, patient will need yearly CT chest to assess the growth.  7. Hypertension- patient blood pressure is elevated - both HCTZ as well as ACE inhibitor are on hold -  Start norvasc 8. Chronic respiratory failure secondary to COPD, possible acute exacerbation with increased wheeze.   -  D/c prednisone and start solumedrol -  Start budesonide and brovana -  Increase to q4h duoneb 9. Wheezing may be due to acute COPD exacerbation vs. Acute diastolic heart failure from IVF.  Breathing treatment helped this morning, but still very SOB and has increased LEE  -  CXR to evaluation for evidence of acute diastolic heat failure:  No vascular congestion or edema -  Resume IVF -  Treatment for COPD as above  10.  Diet:  Healthy heart Access:  PIV IVF:  yes Proph:  heparin  Code Status: full Family Communication: patient and multiple family members present in the room Disposition Plan: pending initiation of chemotherapy  Consultants:  Nephrology  IR  Oncology, Dr. Benay Spice  Procedures:  CT c/a/p 6/14  CT guided biopsy of retroperitoneal LN on 6/16  Antibiotics:  none   HPI/Subjective:  Increased chest tightness and wheeze that improved somewhat with duoneb.  States he uses "liquid oxygen" at home.  Family asked repeatedly if  they could give him medication from home and I repeatedly told them we would provide his medications.  Has increased LEE.  Objective: Filed Vitals:   12/09/14 0953 12/09/14 1703 12/09/14 2201 12/10/14 0548  BP: 131/62 135/75 152/81 149/75  Pulse: 78 93 78 84  Temp: 98.9 F (37.2 C) 98 F (36.7 C) 98 F (36.7 C) 97.7 F (36.5 C)  TempSrc: Oral Oral Oral Oral  Resp: 18 20 20 20   Height:      Weight:      SpO2: 96% 90% 98% 100%    Intake/Output Summary (Last 24 hours) at 12/10/14 0945 Last data filed at 12/09/14 2203  Gross per 24 hour  Intake    480 ml  Output      2 ml  Net    478 ml   Filed Weights   12/07/14 0505 12/07/14 2008 12/08/14 2131  Weight: 98 kg (216 lb 0.8 oz) 98.5 kg (217 lb 2.5 oz) 99.1 kg (218 lb 7.6 oz)   Body mass index is 33.23 kg/(m^2).  Exam:   General:  Obese adult male, mild respiratory distress with SCM retractions and gasping between sentences  HEENT:  NCAT, MMM  Cardiovascular:  Distant heart sounds, 2+ pulses, warm extremities  Respiratory:  Very diminished throughout with full high pitched expiratory wheeze, no rales or rhonchi  Abdomen:   NABS, soft, NT/ND  MSK:   Normal tone and bulk, 2+ pitting bilateral LEE left leg slightly worse than right  Neuro:  Grossly intact  Data Reviewed: Basic Metabolic Panel:  Recent Labs Lab 12/03/14 2104  12/06/14 0437 12/07/14 0644 12/08/14 0539 12/09/14 0550 12/10/14 0404  NA  --   < > 138 138 139 138 140  K  --   < > 3.9 3.6 3.7 3.9 4.0  CL  --   < > 105 99* 103 101 101  CO2  --   < > 26 29 30 29 29   GLUCOSE  --   < > 95 102* 128* 149* 102*  BUN  --   < > 51* 40* 34* 38* 44*  CREATININE  --   < > 4.10* 3.75* 3.57* 3.19* 3.11*  CALCIUM 15.0*  < > 11.9* 12.6* 12.5* 12.5* 11.8*  MG 1.5*  --   --   --   --   --   --   PHOS 4.9*  --   --   --   --   --  4.8*  < > = values in this interval not displayed. Liver Function Tests:  Recent Labs Lab 12/05/14 1042 12/06/14 0437 12/07/14 0644  12/08/14 0539 12/09/14 0550 12/10/14 0404  AST 24 20 23 17 18   --   ALT 16* 15* 16* 15* 16*  --   ALKPHOS 50 50 78 79 100  --   BILITOT 0.4 0.5 0.7 0.7 0.4  --   PROT 5.8* 5.6* 6.8 6.1* 6.2*  --   ALBUMIN 2.9* 2.8* 3.3* 2.9* 3.0* 3.3*   No results for input(s): LIPASE, AMYLASE in the last 168 hours. No results for input(s): AMMONIA in the last 168 hours. CBC:  Recent Labs Lab 12/03/14 1631  WBC 9.1  HGB 14.0  HCT 41.3  MCV 89.0  PLT 282   Cardiac Enzymes: No results for input(s): CKTOTAL, CKMB, CKMBINDEX, TROPONINI in the last 168 hours. BNP (last 3 results)  Recent Labs  12/03/14 1631  BNP 102.8*    ProBNP (last 3 results) No results for input(s): PROBNP in the last 8760 hours.  CBG:  Recent Labs Lab 12/08/14 2130 12/09/14 0747 12/09/14 1111 12/09/14 1710 12/09/14 2151  GLUCAP 231* 151* 293* 234* 333*    No results found for this or any previous visit (from the past 240 hour(s)).   Studies: No results found.  Scheduled Meds: . allopurinol  100 mg Oral Daily  . heparin  5,000 Units Subcutaneous 3 times per day  . insulin aspart  0-15 Units Subcutaneous TID WC  . insulin aspart  0-5 Units Subcutaneous QHS  . insulin glargine  10 Units Subcutaneous QHS  . ipratropium-albuterol  3 mL Nebulization Q6H  . predniSONE  40 mg Oral Q breakfast  . sodium chloride  3 mL Intravenous Q12H   Continuous Infusions: . sodium chloride 1,000 mL (12/09/14 1753)    Principal Problem:   Hypercalcemia Active Problems:   Acute on chronic kidney failure   HTN (hypertension)   DM2 (diabetes mellitus, type 2)   Retroperitoneal lymphadenopathy   Lymphoma, follicular    Time spent: 30 min    Deboraha Goar, [place] [facility] Pager [phone]. If 7PM-7AM, please contact night-coverage at [email or web address], password [facility] 12/10/2014, 9:45 AM  LOS: 7 days

## 2014-12-10 NOTE — Progress Notes (Signed)
IP PROGRESS NOTE  Subjective:   Billy Gray complains of dyspnea the small morning. He request an inhaler.  Objective: Vital signs in last 24 hours: Blood pressure 149/75, pulse 84, temperature 97.7 F (36.5 C), temperature source Oral, resp. rate 20, height 5\' 8"  (1.727 m), weight 218 lb 7.6 oz (99.1 kg), SpO2 99 %.  Intake/Output from previous day: 06/20 0701 - 06/21 0700 In: 12 [P.O.:720] Out: 2 [Urine:1; Stool:1]  Physical Exam:   Lungs: Distant breath sounds, scattered wheeze, increased respiratory rate Cardiac: Regular rhythm, distant heart sounds Extremities: Trace low leg edema   BMET  Recent Labs  12/09/14 0550 12/10/14 0404  NA 138 140  K 3.9 4.0  CL 101 101  CO2 29 29  GLUCOSE 149* 102*  BUN 38* 44*  CREATININE 3.19* 3.11*  CALCIUM 12.5* 11.8*   uric acid 7.1  LDH 139 on 12/09/2014    Studies/Results: Dg Chest Port 1 View  12/10/2014   CLINICAL DATA:  Wheezing  EXAM: PORTABLE CHEST - 1 VIEW  COMPARISON:  Chest radiograph and chest CT December 03, 2014  FINDINGS: There is stable mild scarring in the right base. Central peribronchial thickening remains. There is no frank edema or consolidation. Heart size and pulmonary vascularity are normal. No adenopathy. There is degenerative change in the thoracic spine.  IMPRESSION: Evidence of a degree of chronic bronchitis. Mild scarring right base. No edema or consolidation.   Electronically Signed   By: Lowella Grip III M.D.   On: 12/10/2014 10:44    Medications: I have reviewed the patient's current medications.  Assessment/Plan:  1. Non-Hodgkin's lymphoma, low-grade B cell lymphoma, CD20 and CD10 positive, on a core biopsy of a retroperitoneal mass 12/05/2014  CTs of the chest, abdomen, and pelvis on 12/03/2014 with a conglomerate mass in the pericaval region and retroperitoneal lymphadenopathy  2. Hypercalcemia of malignancy  3. Renal failure  4. COPD, acute COPD flare  5.  Diabetes  6.  Diabetic  neuropathy  7.  Left knee arthritis  [name] has been diagnosed with non-Hodgkin's lymphoma. He was admitted with symptomatic hypercalcemia.  I discussed treatment options with Billy Gray today. He currently appears to be having a COPD flare. He will receive medical therapy per Dr. Sheran Fava. I will delay chemotherapy until 12/11/2014. He is not a candidate for bendamustine with the current renal failure. I recommend CVP-rituximab. We reviewed the potential toxicities associated with this regimen. He understands the chance of an allergic reaction, leuko-encephalitis, reactivation of hepatitis, and hematologic toxicity with rituximab. We discussed the nausea/vomiting, alopecia, mucositis, hematologic toxicity, hemorrhagic cystitis, and neuropathy seen with the Cytoxan/vincristine regimen. We also discussed the vesicle into property of vincristine. He reports diabetic neuropathy affecting his feet. The vincristine will be dose reduced. He agrees to proceed.  The plan is to initiate systemic therapy on 12/11/2014 if the COPD flare improves.   LOS: 7 days   Billy Gray, Billy Gray  12/10/2014, 3:29 PM

## 2014-12-10 NOTE — Progress Notes (Signed)
Pt c/o SOB. Pt states this has happened before at home, but that he usually has his inhaler handy when this does happen. PRN Duoneb breathing tx ordered and currently being given to pt. On assessment bilateral breath sounds were diminished and clear

## 2014-12-11 LAB — RENAL FUNCTION PANEL
Albumin: 3.1 g/dL — ABNORMAL LOW (ref 3.5–5.0)
Anion gap: 10 (ref 5–15)
BUN: 44 mg/dL — ABNORMAL HIGH (ref 6–20)
CALCIUM: 10.5 mg/dL — AB (ref 8.9–10.3)
CO2: 27 mmol/L (ref 22–32)
Chloride: 98 mmol/L — ABNORMAL LOW (ref 101–111)
Creatinine, Ser: 3.02 mg/dL — ABNORMAL HIGH (ref 0.61–1.24)
GFR calc Af Amer: 23 mL/min — ABNORMAL LOW (ref >60)
GFR calc non Af Amer: 20 mL/min — ABNORMAL LOW (ref >60)
GLUCOSE: 293 mg/dL — AB (ref 65–99)
PHOSPHORUS: 3 mg/dL (ref 2.5–4.6)
POTASSIUM: 4.4 mmol/L (ref 3.5–5.1)
Sodium: 135 mmol/L (ref 135–145)

## 2014-12-11 LAB — CBC WITH DIFFERENTIAL/PLATELET
BASOS ABS: 0 10*3/uL (ref 0.0–0.1)
Basophils Relative: 0 % (ref 0–1)
EOS PCT: 0 % (ref 0–5)
Eosinophils Absolute: 0 10*3/uL (ref 0.0–0.7)
HCT: 36.8 % — ABNORMAL LOW (ref 39.0–52.0)
Hemoglobin: 12.2 g/dL — ABNORMAL LOW (ref 13.0–17.0)
LYMPHS PCT: 9 % — AB (ref 12–46)
Lymphs Abs: 0.6 10*3/uL — ABNORMAL LOW (ref 0.7–4.0)
MCH: 29.5 pg (ref 26.0–34.0)
MCHC: 33.2 g/dL (ref 30.0–36.0)
MCV: 89.1 fL (ref 78.0–100.0)
MONO ABS: 0.2 10*3/uL (ref 0.1–1.0)
MONOS PCT: 3 % (ref 3–12)
Neutro Abs: 5.9 10*3/uL (ref 1.7–7.7)
Neutrophils Relative %: 88 % — ABNORMAL HIGH (ref 43–77)
Platelets: 286 10*3/uL (ref 150–400)
RBC: 4.13 MIL/uL — AB (ref 4.22–5.81)
RDW: 13.7 % (ref 11.5–15.5)
WBC: 6.7 10*3/uL (ref 4.0–10.5)

## 2014-12-11 LAB — HEPATITIS B CORE ANTIBODY, TOTAL: Hep B Core Total Ab: NEGATIVE

## 2014-12-11 LAB — HEPATITIS B SURFACE ANTIGEN: HEP B S AG: NEGATIVE

## 2014-12-11 LAB — GLUCOSE, CAPILLARY
GLUCOSE-CAPILLARY: 218 mg/dL — AB (ref 65–99)
GLUCOSE-CAPILLARY: 339 mg/dL — AB (ref 65–99)
Glucose-Capillary: 114 mg/dL — ABNORMAL HIGH (ref 65–99)
Glucose-Capillary: 255 mg/dL — ABNORMAL HIGH (ref 65–99)
Glucose-Capillary: 246 mg/dL — ABNORMAL HIGH (ref 65–99)

## 2014-12-11 MED ORDER — SODIUM CHLORIDE 0.9 % IJ SOLN: 3.0000 mL | INTRAMUSCULAR | Status: DC | PRN

## 2014-12-11 MED ORDER — HEPARIN SOD (PORK) LOCK FLUSH 100 UNIT/ML IV SOLN: 250.0000 [IU] | Freq: Once | INTRAVENOUS | Status: AC | PRN

## 2014-12-11 MED ORDER — FAMOTIDINE IN NACL 20-0.9 MG/50ML-% IV SOLN: 20.0000 mg | Freq: Once | INTRAVENOUS | Status: AC | PRN

## 2014-12-11 MED ORDER — DIPHENHYDRAMINE HCL 50 MG/ML IJ SOLN: 25.0000 mg | Freq: Once | INTRAMUSCULAR | Status: AC | PRN

## 2014-12-11 MED ORDER — EPINEPHRINE HCL 0.1 MG/ML IJ SOSY: 0.2500 mg | PREFILLED_SYRINGE | Freq: Once | INTRAMUSCULAR | Status: AC | PRN

## 2014-12-11 MED ORDER — EPINEPHRINE HCL 1 MG/ML IJ SOLN: 0.5000 mg | Freq: Once | INTRAMUSCULAR | Status: AC | PRN

## 2014-12-11 MED ORDER — ALBUTEROL SULFATE (2.5 MG/3ML) 0.083% IN NEBU: 2.5000 mg | INHALATION_SOLUTION | Freq: Once | RESPIRATORY_TRACT | Status: AC | PRN

## 2014-12-11 MED ORDER — SODIUM CHLORIDE 0.9 % IV SOLN
Freq: Once | INTRAVENOUS | Status: AC
  Administered 2014-12-11: 13:00:00 via INTRAVENOUS

## 2014-12-11 MED ORDER — RITUXIMAB CHEMO INJECTION 500 MG/50ML
375.0000 mg/m2 | Freq: Once | INTRAVENOUS | Status: AC
  Administered 2014-12-11: 800 mg via INTRAVENOUS
  Filled 2014-12-11: qty 80

## 2014-12-11 MED ORDER — ALTEPLASE 2 MG IJ SOLR: 2.0000 mg | Freq: Once | INTRAMUSCULAR | Status: AC | PRN

## 2014-12-11 MED ORDER — METHYLPREDNISOLONE SODIUM SUCC 125 MG IJ SOLR: 125.0000 mg | Freq: Once | INTRAMUSCULAR | Status: AC | PRN

## 2014-12-11 MED ORDER — SODIUM CHLORIDE 0.9 % IV SOLN
Freq: Once | INTRAVENOUS | Status: AC
  Administered 2014-12-11: 11:00:00 via INTRAVENOUS
  Filled 2014-12-11: qty 8

## 2014-12-11 MED ORDER — SODIUM CHLORIDE 0.9 % IV SOLN: Freq: Once | INTRAVENOUS | Status: AC | PRN

## 2014-12-11 MED ORDER — SODIUM CHLORIDE 0.9 % IV SOLN
400.0000 mg/m2 | Freq: Once | INTRAVENOUS | Status: AC
  Administered 2014-12-11: 880 mg via INTRAVENOUS
  Filled 2014-12-11: qty 44

## 2014-12-11 MED ORDER — HOT PACK MISC ONCOLOGY
1.0000 | Freq: Once | Status: AC | PRN
  Filled 2014-12-11: qty 1

## 2014-12-11 MED ORDER — COLD PACK MISC ONCOLOGY
1.0000 | Freq: Once | Status: AC | PRN
  Filled 2014-12-11: qty 1

## 2014-12-11 MED ORDER — HEPARIN SOD (PORK) LOCK FLUSH 100 UNIT/ML IV SOLN: 500.0000 [IU] | Freq: Once | INTRAVENOUS | Status: AC | PRN

## 2014-12-11 MED ORDER — ACETAMINOPHEN 325 MG PO TABS
650.0000 mg | ORAL_TABLET | Freq: Once | ORAL | Status: AC
Start: 2014-12-11 — End: 2014-12-11
  Administered 2014-12-11: 650 mg via ORAL
  Filled 2014-12-11: qty 2

## 2014-12-11 MED ORDER — EPINEPHRINE HCL 1 MG/ML IJ SOLN
0.5000 mg | Freq: Once | INTRAMUSCULAR | Status: AC | PRN
Start: 2014-12-11 — End: 2014-12-11

## 2014-12-11 MED ORDER — DIPHENHYDRAMINE HCL 50 MG PO CAPS
50.0000 mg | ORAL_CAPSULE | Freq: Once | ORAL | Status: AC
  Administered 2014-12-11: 50 mg via ORAL
  Filled 2014-12-11: qty 1

## 2014-12-11 MED ORDER — SODIUM CHLORIDE 0.9 % IJ SOLN: 10.0000 mL | INTRAMUSCULAR | Status: DC | PRN

## 2014-12-11 MED ORDER — DIPHENHYDRAMINE HCL 50 MG/ML IJ SOLN: 50.0000 mg | Freq: Once | INTRAMUSCULAR | Status: AC | PRN

## 2014-12-11 NOTE — Progress Notes (Signed)
Patient tolerated his Cyclophosphamide and Rituxan today without any complications. Patient was able to get to goal rate of 400mg /hr with his Rituxan infusion. Will continue to monitor.

## 2014-12-11 NOTE — Progress Notes (Signed)
Chemotherapy education begun and chemotherapy consent obtained.

## 2014-12-11 NOTE — Progress Notes (Signed)
IP PROGRESS NOTE  Subjective:   [name] reports marked improvement in the dyspnea since yesterday. He is ready to proceed with systemic therapy today.  Objective: Vital signs in last 24 hours: Blood pressure 139/61, pulse 66, temperature 97.1 F (36.2 C), temperature source Oral, resp. rate 20, height 5\' 8"  (1.727 m), weight 218 lb 7.6 oz (99.1 kg), SpO2 95 %.  Intake/Output from previous day: 06/21 0701 - 06/22 0700 In: 1152.5 [P.O.:930; I.V.:222.5] Out: 5 [Urine:4; Stool:1]  Physical Exam:   Lungs: Distant breath sounds, no respiratory distress Cardiac: Regular rhythm, distant heart sounds Extremities: Trace low leg edema bilaterally Neurologic: Decreased sensation to light touch at the foot bilaterally   BMET  Recent Labs  12/10/14 0404 12/11/14 0411  NA 140 135  K 4.0 4.4  CL 101 98*  CO2 29 27  GLUCOSE 102* 293*  BUN 44* 44*  CREATININE 3.11* 3.02*  CALCIUM 11.8* 10.5*   uric acid 7.1  LDH 139 on 12/09/2014    Studies/Results: Dg Chest Port 1 View  12/10/2014   CLINICAL DATA:  Wheezing  EXAM: PORTABLE CHEST - 1 VIEW  COMPARISON:  Chest radiograph and chest CT December 03, 2014  FINDINGS: There is stable mild scarring in the right base. Central peribronchial thickening remains. There is no frank edema or consolidation. Heart size and pulmonary vascularity are normal. No adenopathy. There is degenerative change in the thoracic spine.  IMPRESSION: Evidence of a degree of chronic bronchitis. Mild scarring right base. No edema or consolidation.   Electronically Signed   By: Lowella Grip III M.D.   On: 12/10/2014 10:44    Medications: I have reviewed the patient's current medications.  Assessment/Plan:  1. Non-Hodgkin's lymphoma, low-grade B cell lymphoma, CD20 and CD10 positive, on a core biopsy of a retroperitoneal mass 12/05/2014  CTs of the chest, abdomen, and pelvis on 12/03/2014 with a conglomerate mass in the pericaval region and retroperitoneal  lymphadenopathy  2. Hypercalcemia of malignancy-improved  3. Renal failure  4. COPD, acute COPD flare-improved  5.  Diabetes  6.  Diabetic neuropathy  7.  Left knee arthritis  [name] appears improved from a respiratory standpoint today. He appears stable to begin systemic therapy for treatment of the non-Hodgkin's lymphoma. We again reviewed the potential toxicities associated with rituximab and chemotherapy. He agrees to proceed. We will hold the prednisone since he is receiving Solu-Medrol. Vincristine will be held secondary to pre-existing neuropathy. The plan is to proceed with Cytoxan/rituximab today. I will dose reduce the Cytoxan with the renal failure.    LOS: 8 days   Missey Hasley  12/11/2014, 9:05 AM

## 2014-12-11 NOTE — Progress Notes (Signed)
[facility] PROGRESS NOTE  Tayvion Lauder BVQ:[id number] DOB: Oct 23, 1945 DOA: 12/03/2014 PCP: Pcp Not In System  Brief Summary: From prior PN  69 y.o. male who with CKD, T2DM, HTN, and COPD on 3L O2 who presented to the ED with c/o fatigue, generalized weakness, cough, decreased PO intake for two weeks prior to admission. He had been taking small amounts of tums or peptobismol. He was diagnosed with kidney problems at the [place] about three months prior to admission and told his "creatinine was near needing dialysis". He had also had unexplained weight loss for the last three months.  He was found to have hypercalcemia and an elevated creatinine.   He was started on IVF, calcitonin, and pamidronate for his hypercalemia.  His calcium and creatinine trended down.  Work up for etiology of hypercalcemia demonstrated retroperitoneal lymphadenopathy.  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  Nephrology and Oncology have been following.  He was transferred to [facility] to start chemotherapy.  LDH was normal.  PET scan will be obtained as outpatient.  Plan to start CVP and rituximab.     Assessment/Plan  1. Hypercalcemia- Oncology and Nephrology on board and managing. 2. Non-Hodgkin's B-cell lymphoma- CT abdomen revealed retroperitoneal lymphadenopathy, Conglomerate mass of lymph nodes in the pericaval region measures up to about 14.3 x 12.3 x 7.4 cm. Additional enlarged lymph nodes are also demonstrated in the periaortic regions. S/p Biopsy done from the retroperitoneal lymph nodes came back with follicular lymphoma.  -  Appreciate oncology assistance -  Hep B negative -  Chemotherapy initiated. 3. Left knee swelling- improved, x-ray of left knee revealed significant joint effusion. Patient had arthrocentesis 2 weeks ago at [facility], will follow [place] as outpatient 4. CKD stage IV- patient's creatinine is improving,  today creatinine is 3.11  -  Nephrology is following and managing the fluid balance.  -  HCTZ and ACE inhibitor have been held  -  Renally dose medications and minimize nephrotoxins 5. Diabetes mellitus- CBG elevated -  HA1c: pending -  Not starting chemo yet and increasing steroids so continue lantus to 15 units -   Continue SSI moderate dose -  Add standing meal time insulin 3 units aspart 6. Thoracic aortic aneurysm- stable, CT chest shows 4.1 cm of ascending thoracic aortic aneurysm, patient will need yearly CT chest to assess the growth.  7. Hypertension- patient blood pressure is elevated - both HCTZ as well as ACE inhibitor are on hold -  Start norvasc 8. Chronic respiratory failure secondary to COPD, possible acute exacerbation with increased wheeze.   -  ON solumedrol -  Start budesonide and brovana -  Increase to q4h duoneb 9. Wheezing may be due to acute COPD exacerbation vs. Acute diastolic heart failure from IVF.  Breathing treatment helped this morning, but still very SOB and has increased LEE  -  CXR to evaluation for evidence of acute diastolic heat failure:  No vascular congestion or edema -  Resume IVF -  Treatment for COPD as above  10.  Diet:  Healthy heart Access:  PIV IVF:  yes Proph:  heparin  Code Status: full Family Communication: patient and multiple family members present in the room Disposition Plan: pending initiation of chemotherapy   Consultants:  Nephrology  IR  Oncology, Dr. Benay Spice  Procedures:  CT c/a/p 6/14  CT guided biopsy of retroperitoneal LN on 6/16  Antibiotics:  none  HPI/Subjective: Pt has no new complaints. No acute issues reported overnight.  Objective: Filed Vitals:   12/11/14 1305 12/11/14 1344 12/11/14 1428 12/11/14 1446  BP: 128/62 131/68 132/65   Pulse: 65 73 77   Temp: 98.1 F (36.7 C) 98.2 F (36.8 C) 97.8 F (36.6 C)   TempSrc: Oral Oral Oral   Resp: 20 20 20    Height:      Weight:      SpO2:  97% 99%  100%    Intake/Output Summary (Last 24 hours) at 12/11/14 1457 Last data filed at 12/11/14 1346  Gross per 24 hour  Intake 1362.5 ml  Output    154 ml  Net 1208.5 ml   Filed Weights   12/07/14 0505 12/07/14 2008 12/08/14 2131  Weight: 98 kg (216 lb 0.8 oz) 98.5 kg (217 lb 2.5 oz) 99.1 kg (218 lb 7.6 oz)   Body mass index is 33.23 kg/(m^2).  Exam:   General:  Obese adult male, Alert and awake  HEENT:  NCAT, MMM  Cardiovascular:  Distant heart sounds, 2+ pulses, warm extremities  Respiratory:  Very diminished throughout, equal chest rise, no wheezes  Abdomen:   NABS, soft, NT/ND  MSK:   Normal tone and bulk, 2+ pitting bilateral LEE left leg slightly worse than right  Neuro:  Grossly intact  Data Reviewed: Basic Metabolic Panel:  Recent Labs Lab 12/07/14 0644 12/08/14 0539 12/09/14 0550 12/10/14 0404 12/11/14 0411  NA 138 139 138 140 135  K 3.6 3.7 3.9 4.0 4.4  CL 99* 103 101 101 98*  CO2 29 30 29 29 27   GLUCOSE 102* 128* 149* 102* 293*  BUN 40* 34* 38* 44* 44*  CREATININE 3.75* 3.57* 3.19* 3.11* 3.02*  CALCIUM 12.6* 12.5* 12.5* 11.8* 10.5*  PHOS  --   --   --  4.8* 3.0   Liver Function Tests:  Recent Labs Lab 12/05/14 1042 12/06/14 0437 12/07/14 0644 12/08/14 0539 12/09/14 0550 12/10/14 0404 12/11/14 0411  AST 24 20 23 17 18   --   --   ALT 16* 15* 16* 15* 16*  --   --   ALKPHOS 50 50 78 79 100  --   --   BILITOT 0.4 0.5 0.7 0.7 0.4  --   --   PROT 5.8* 5.6* 6.8 6.1* 6.2*  --   --   ALBUMIN 2.9* 2.8* 3.3* 2.9* 3.0* 3.3* 3.1*   No results for input(s): LIPASE, AMYLASE in the last 168 hours. No results for input(s): AMMONIA in the last 168 hours. CBC:  Recent Labs Lab 12/11/14 1040  WBC 6.7  NEUTROABS 5.9  HGB 12.2*  HCT 36.8*  MCV 89.1  PLT 286   Cardiac Enzymes: No results for input(s): CKTOTAL, CKMB, CKMBINDEX, TROPONINI in the last 168 hours. BNP (last 3 results)  Recent Labs  12/03/14 1631  BNP 102.8*    ProBNP  (last 3 results) No results for input(s): PROBNP in the last 8760 hours.  CBG:  Recent Labs Lab 12/10/14 1219 12/10/14 1800 12/10/14 2144 12/11/14 0705 12/11/14 1125  GLUCAP 177* 277* 238* 255* 218*    No results found for this or any previous visit (from the past 240 hour(s)).   Studies: Dg Chest Port 1 View  12/10/2014   CLINICAL DATA:  Wheezing  EXAM: PORTABLE CHEST - 1 VIEW  COMPARISON:  Chest radiograph and chest CT December 03, 2014  FINDINGS: There is stable mild scarring in the right base. Central peribronchial thickening remains. There  is no frank edema or consolidation. Heart size and pulmonary vascularity are normal. No adenopathy. There is degenerative change in the thoracic spine.  IMPRESSION: Evidence of a degree of chronic bronchitis. Mild scarring right base. No edema or consolidation.   Electronically Signed   By: Lowella Grip III M.D.   On: 12/10/2014 10:44    Scheduled Meds: . allopurinol  100 mg Oral Daily  . amLODipine  5 mg Oral Daily  . arformoterol  15 mcg Nebulization BID  . budesonide (PULMICORT) nebulizer solution  0.5 mg Nebulization BID  . heparin  5,000 Units Subcutaneous 3 times per day  . insulin aspart  0-15 Units Subcutaneous TID WC  . insulin aspart  0-5 Units Subcutaneous QHS  . insulin aspart  3 Units Subcutaneous TID WC  . insulin glargine  15 Units Subcutaneous QHS  . ipratropium-albuterol  3 mL Nebulization TID  . methylPREDNISolone (SOLU-MEDROL) injection  60 mg Intravenous Q6H  . sodium chloride  3 mL Intravenous Q12H   Continuous Infusions: . sodium chloride 50 mL/hr at 12/10/14 1425    Principal Problem:   Hypercalcemia Active Problems:   Acute on chronic kidney failure   HTN (hypertension)   DM2 (diabetes mellitus, type 2)   Retroperitoneal lymphadenopathy   Lymphoma, follicular   Chronic respiratory failure   COPD with acute exacerbation    Time spent: 35 min    Velvet Bathe  [facility] Pager [phone].  If 7PM-7AM, please contact night-coverage at [email or web address], password [facility] 12/11/2014, 2:57 PM  LOS: 8 days

## 2014-12-11 NOTE — Progress Notes (Cosign Needed)
Patient's BSA and chemotherapy dosages of Cytoxan and Rituxan were double checked with Drue Dun, RN to verify accuracy.

## 2014-12-11 NOTE — Progress Notes (Signed)
Subjective:   Ca down to 10.5- creatinine continues to trend down slowly   staring cytoxan and  rituximab UOP not accurate- weight continues to go up   06/21 0701 - 06/22 0700 In: 1152.5 [P.O.:930; I.V.:222.5] Out: 5 [Urine:4; Stool:1]  Filed Weights   12/07/14 0505 12/07/14 2008 12/08/14 2131  Weight: 98 kg (216 lb 0.8 oz) 98.5 kg (217 lb 2.5 oz) 99.1 kg (218 lb 7.6 oz)     Physical Exam:  BP 131/68 mmHg  Pulse 73  Temp(Src) 98.2 F (36.8 C) (Oral)  Resp 20  Ht 5\' 8"  (1.727 m)  Wt 99.1 kg (218 lb 7.6 oz)  BMI 33.23 kg/m2  SpO2 99% GEN: NAD, obese, very pleasant and talkative Wearing O2 Sclerae not icteric CV: Regular S1S2 No S3. No rub PULM: clear without crackles or wheezes ABD: protuberant,soft, non-tender SKIN: no rashes/lesions EXT:2+ edema bilat LE's  Scheduled Meds: . allopurinol  100 mg Oral Daily  . amLODipine  5 mg Oral Daily  . arformoterol  15 mcg Nebulization BID  . budesonide (PULMICORT) nebulizer solution  0.5 mg Nebulization BID  . heparin  5,000 Units Subcutaneous 3 times per day  . insulin aspart  0-15 Units Subcutaneous TID WC  . insulin aspart  0-5 Units Subcutaneous QHS  . insulin aspart  3 Units Subcutaneous TID WC  . insulin glargine  15 Units Subcutaneous QHS  . ipratropium-albuterol  3 mL Nebulization TID  . methylPREDNISolone (SOLU-MEDROL) injection  60 mg Intravenous Q6H  . sodium chloride  3 mL Intravenous Q12H   . sodium chloride 50 mL/hr at 12/10/14 1425    Recent Labs Lab 12/09/14 0550 12/10/14 0404 12/11/14 0411  NA 138 140 135  K 3.9 4.0 4.4  CL 101 101 98*  CO2 29 29 27   GLUCOSE 149* 102* 293*  BUN 38* 44* 44*  CREATININE 3.19* 3.11* 3.02*  CALCIUM 12.5* 11.8* 10.5*  PHOS  --  4.8* 3.0    Recent Labs Lab 12/11/14 1040  WBC 6.7  NEUTROABS 5.9  HGB 12.2*  HCT 36.8*  MCV 89.1  PLT 286   Background 64M with severe hypercalcemia, CKD4-5? with possible AKI, CT demonstrated retroperitoneal lymphadenopathy  concerning for lymphoma. 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  A/P 1. Hypercalcemia 2/2 excessive endogenous 1,25 Vit D; Severe, admit at > 15.0- now 10.8 1. Rec'd Pamidronate 90mg  IV 6/16 2. Stopped calcitonin 6/17 after 72h (6 doses) 3. Be cautious with IVF as weight is up  4. Following volume status closely -- diuretics as needed - would need to use loop diuretic (no thiazides) 5. Etiology (PTH suppressed, PTHrP neg, 1,25 D elevated) 1. Driven by 1,02 OH excess likely related to lymphoma (dx + by bx) 6. Started prednisone 40mg /day today for 1,25 OH Vit D process   2. CKD4-5 1. Unsure of baseline, sees [place], told 'nearing dialysis' 2. Has continuous very slow  improvement in SCr with hydration and treatment of hypercalcemia 3. Cont to follow closely  4. 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 Daily weights, Daily Renal Panel, Strict I/Os, Avoid nephrotoxins (NSAIDs, judicious IV Contrast) 6. Anemia due to CKD is not an issue 3. Extensive Retroperitoneal LAN 1. Biopsy findings c/w B-cell NHL of germinal center origin- started on cytoxan and rituxan 2. TRH discussed with Heme-Onc - presume they will now see given lymphoma diagnosis (seen by PA - says "MD" to come later today) 4. DM2 5. HTN 1.  Hold Thiazide and ACEi 2. Follow 3. - amlodipine started 6/21 - hold on diuretics due to them possibly making calc or creatinine worse- allow him to mobilize fluid but this could be the reason BP is up for now- be cautious with IVF and if weight cont [place] up may need a little lasix  Renal will not see on 6/23- will revisit on 6/24 but will look at weights and BP to decide if any lasix is needed and will add  Kela Baccari A   12/11/2014, 2:23 PM

## 2014-12-12 ENCOUNTER — Telehealth: Payer: Self-pay | Admitting: Oncology

## 2014-12-12 ENCOUNTER — Other Ambulatory Visit: Payer: Self-pay | Admitting: *Deleted

## 2014-12-12 DIAGNOSIS — C8290 Follicular lymphoma, unspecified, unspecified site: Secondary | ICD-10-CM

## 2014-12-12 DIAGNOSIS — N19 Unspecified kidney failure: Secondary | ICD-10-CM

## 2014-12-12 LAB — HEMOGLOBIN A1C
Hgb A1c MFr Bld: 7.1 % — ABNORMAL HIGH (ref 4.8–5.6)
Mean Plasma Glucose: 157 mg/dL

## 2014-12-12 LAB — RENAL FUNCTION PANEL
ALBUMIN: 3.2 g/dL — AB (ref 3.5–5.0)
Anion gap: 8 (ref 5–15)
BUN: 52 mg/dL — ABNORMAL HIGH (ref 6–20)
CHLORIDE: 98 mmol/L — AB (ref 101–111)
CO2: 28 mmol/L (ref 22–32)
CREATININE: 3.09 mg/dL — AB (ref 0.61–1.24)
Calcium: 10 mg/dL (ref 8.9–10.3)
GFR calc Af Amer: 22 mL/min — ABNORMAL LOW (ref >60)
GFR calc non Af Amer: 19 mL/min — ABNORMAL LOW (ref >60)
Glucose, Bld: 308 mg/dL — ABNORMAL HIGH (ref 65–99)
Phosphorus: 3.5 mg/dL (ref 2.5–4.6)
Potassium: 4.3 mmol/L (ref 3.5–5.1)
Sodium: 134 mmol/L — ABNORMAL LOW (ref 135–145)

## 2014-12-12 LAB — GLUCOSE, CAPILLARY: GLUCOSE-CAPILLARY: 265 mg/dL — AB (ref 65–99)

## 2014-12-12 MED ORDER — ALLOPURINOL 100 MG PO TABS: 100.0000 mg | ORAL_TABLET | Freq: Every day | ORAL | Status: DC

## 2014-12-12 MED ORDER — AMLODIPINE BESYLATE 5 MG PO TABS: 5.0000 mg | ORAL_TABLET | Freq: Every day | ORAL | Status: DC

## 2014-12-12 MED ORDER — INSULIN GLARGINE 100 UNIT/ML ~~LOC~~ SOLN: 15.0000 [IU] | Freq: Every day | SUBCUTANEOUS | Status: DC

## 2014-12-12 NOTE — Progress Notes (Signed)
Inpatient Diabetes Program Recommendations  AACE/ADA: New Consensus Statement on Inpatient Glycemic Control (2013)  Target Ranges:  Prepandial:   less than 140 mg/dL      Peak postprandial:   less than 180 mg/dL (1-2 hours)      Critically ill patients:  140 - 180 mg/dL   Reason for Visit: Hyperglycemia  Results for Billy Gray, Billy Gray (MRN [id number]) as of 12/12/2014 09:46  Ref. Range 12/11/2014 07:05 12/11/2014 11:25 12/11/2014 17:26 12/11/2014 21:17 12/12/2014 07:07  Glucose-Capillary Latest Ref Range: 65-99 mg/dL 255 (H) 218 (H) 246 (H) 339 (H) 265 (H)    Hyperglycemia with steroids. Needs insulin adjustment.  Increase Lantus to 30 units QHS (home dose)  Will continue to follow while inpatient. Thank you. Lorenda Peck, RD, LDN, CDE Inpatient Diabetes Coordinator [phone]

## 2014-12-12 NOTE — Progress Notes (Signed)
Nursing Discharge Summary  Patient ID: Sou Nohr MRN: [id number] DOB/AGE: Dec 25, 1945 69 y.o.  Admit date: 12/03/2014 Discharge date: 12/12/2014  Discharged Condition: good  Disposition: 01-Home or Self Care    Prescriptions Given: Patient follow up appointments and medications discussed with patient and family at bedside. Patient and family all verbalized understanding. Patient given prescription for allopurinol. Amlodipine already called into pharmacy in [place]. Emphasized to patient and family that there was a change in his allopurinol dosage from what he was previously taking at home.   Means of Discharge: Patient to be taken downstairs via wheelchair with oxygen to be discharged home with family via private vehicle.   Signed: Buel Ream 12/12/2014, 11:56 AM

## 2014-12-12 NOTE — Telephone Encounter (Signed)
Added appts per pof...per orders pof pt will get appts on dc papers

## 2014-12-12 NOTE — Discharge Summary (Signed)
Physician Discharge Summary  Billy Gray VVO:[id number] DOB: 1945/07/09 DOA: 12/03/2014  PCP: Pcp Not In System  Admit date: 12/03/2014 Discharge date: 12/12/2014  Time spent: > 35 minutes  Recommendations for Outpatient Follow-up:  1. Monitor serum creatinine 2. Monitor serum calcium levels 3. Monitor sodium levels 4. Adjust hypoglycemic agents accordingly 5. Patient should continue allopurinol for 1 wk post d/c 6. CT chest shows 4.1 cm of ascending thoracic aortic aneurysm, patient will need yearly CT chest to assess the growth.   Discharge Diagnoses:  Principal Problem:   Hypercalcemia Active Problems:   Acute on chronic kidney failure   HTN (hypertension)   DM2 (diabetes mellitus, type 2)   Retroperitoneal lymphadenopathy   Lymphoma, follicular   Chronic respiratory failure   COPD with acute exacerbation   Discharge Condition: stable  Diet recommendation: heart healthy/diabetic diet  Filed Weights   12/07/14 0505 12/07/14 2008 12/08/14 2131  Weight: 98 kg (216 lb 0.8 oz) 98.5 kg (217 lb 2.5 oz) 99.1 kg (218 lb 7.6 oz)    History of present illness:  69 y.o. male who with CKD, T2DM, HTN, and COPD on 3L O2 who presented to the ED with c/o fatigue, generalized weakness, cough, decreased PO intake for two weeks prior to admission. He had been taking small amounts of tums or peptobismol. He was diagnosed with kidney problems at the [place] about three months prior to admission and told his "creatinine was near needing dialysis". He had also had unexplained weight loss for the last three months. He was found to have hypercalcemia and an elevated creatinine. He was started on IVF, calcitonin, and pamidronate for his hypercalemia. His calcium and creatinine trended down. Work up for etiology of hypercalcemia demonstrated retroperitoneal lymphadenopathy. 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  Nephrology and Oncology have been following. He was transferred to [facility] to start chemotherapy. LDH was normal. PET scan will be obtained as outpatient. Plan to start CVP and rituximab.   Hospital Course:   1. Hypercalcemia- Oncology and Nephrology on board and managing. Patient started chemotherapy. No recommendations to continue steroid therapy from oncologist/hematology after initiation of chemotherapy.  Will plan on discharging with plans for patient to f/u with oncologist/hematologist. Per oncology patient should continue allopurinol for 1 week post d/c 2. Non-Hodgkin's B-cell lymphoma- CT abdomen revealed retroperitoneal lymphadenopathy, Conglomerate mass of lymph nodes in the pericaval region measures up to about 14.3 x 12.3 x 7.4 cm. Additional enlarged lymph nodes are also demonstrated in the periaortic regions. S/p Biopsy done from the retroperitoneal lymph nodes came back with follicular lymphoma.  - Hep B negative - Chemotherapy initiated. 3. Left knee swelling- improved, x-ray of left knee revealed significant joint effusion. Patient had arthrocentesis 2 weeks ago at [facility], will follow [facility] as outpatient 4. CKD stage IV- patient's creatinine is improving, today creatinine is 3.11  - Nephrology is followed while patient in house. - HCTZ and ACE inhibitor have been held  - Renally dosed medications and minimized nephrotoxins 5. Diabetes mellitus- CBG elevated - HA1c: pending - Will continue on Lantus on d/c 6. Thoracic aortic aneurysm- stable, CT chest shows 4.1 cm of ascending thoracic aortic aneurysm, patient will need yearly CT chest to assess the growth.  7. Hypertension- patient blood pressure is elevated - both HCTZ as well as ACE inhibitor are on hold - Started norvasc while patient was in house. 8. Chronic respiratory failure secondary to  COPD, possible acute exacerbation with increased wheeze.  - resolved no wheezes as such will not d/c on  steroids - pt to continue albuterol  Procedures:    Consultations:  Radiology  Oncology  Nephrology  Discharge Exam: Filed Vitals:   12/12/14 0600  BP: 133/80  Pulse: 86  Temp: 97.6 F (36.4 C)  Resp: 20    General: Pt in nad, alert and awake Cardiovascular: rrr, no rubs Respiratory: breathing comfortably with [place] in place, no wheezes, no increased wob  Discharge Instructions   Discharge Instructions    Call MD for:  difficulty breathing, headache or visual disturbances    Complete by:  As directed      Call MD for:  persistant dizziness or light-headedness    Complete by:  As directed      Call MD for:  redness, tenderness, or signs of infection (pain, swelling, redness, odor or green/yellow discharge around incision site)    Complete by:  As directed      Call MD for:  temperature >100.4    Complete by:  As directed      Diet - low sodium heart healthy    Complete by:  As directed      Discharge instructions    Complete by:  As directed   Discharge home with follow up to see his nephrologist and continue to f/u with his oncologist.     Increase activity slowly    Complete by:  As directed      TREATMENT CONDITIONS    Complete by:  As directed   Notify the MD for the following lab values: ANC < 1500, Hemoglobin < 8, PLT < 100,000,  Creatinine > 1.5, urine output < 200 ml prior to cisplatin, Total Bili > 1.5, ALT & AST > 80. If labs are abnormal OR no lab data is available, or if patient has unstable vital signs: Temperature > 38.5, SBP > 180 or < 90, RR > 30 or HR > 100 MD must be notified and order obtained to begin chemotherapy.          Current Discharge Medication List    START taking these medications   Details  amLODipine (NORVASC) 5 MG tablet Take 1 tablet (5 mg total) by mouth daily. Qty: 30 tablet, Refills: 0      CONTINUE these medications which have CHANGED   Details  allopurinol (ZYLOPRIM) 100 MG tablet Take 1 tablet (100 mg total) by mouth  daily. Qty: 30 tablet, Refills: 0    insulin glargine (LANTUS) 100 UNIT/ML injection Inject 0.15 mLs (15 Units total) into the skin at bedtime. Qty: 10 mL, Refills: 0      CONTINUE these medications which have NOT CHANGED   Details  acetaminophen (TYLENOL) 500 MG tablet Take 500 mg by mouth every 6 (six) hours as needed for moderate pain.    albuterol (PROVENTIL HFA;VENTOLIN HFA) 108 (90 BASE) MCG/ACT inhaler Inhale 2 puffs into the lungs every 6 (six) hours as needed for wheezing or shortness of breath.    budesonide-formoterol (SYMBICORT) 160-4.5 MCG/ACT inhaler Inhale 2 puffs into the lungs every evening.    pravastatin (PRAVACHOL) 40 MG tablet Take 40 mg by mouth every evening.    traZODone (DESYREL) 50 MG tablet Take 50 mg by mouth at bedtime.    zolpidem (AMBIEN) 10 MG tablet Take 10 mg by mouth at bedtime.      STOP taking these medications     glipiZIDE (GLUCOTROL) 10 MG tablet  lisinopril-hydrochlorothiazide (PRINZIDE,ZESTORETIC) 20-12.5 MG per tablet        No Known Allergies    The results of significant diagnostics from this hospitalization (including imaging, microbiology, ancillary and laboratory) are listed below for reference.    Significant Diagnostic Studies: Ct Abdomen Pelvis Wo Contrast  12/03/2014   CLINICAL DATA:  Generalized fatigue and cough for a week. No appetite.  EXAM: CT CHEST, ABDOMEN AND PELVIS WITHOUT CONTRAST  TECHNIQUE: Multidetector CT imaging of the chest, abdomen and pelvis was performed following the standard protocol without IV contrast.  COMPARISON:  None.  FINDINGS: CT CHEST FINDINGS  Normal heart size. Coronary artery calcifications. 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 Aortic calcification. No definite lymphadenopathy in the chest although non conscious examination limits evaluation of hilar lymph nodes. Esophagus is decompressed.  Mild linear atelectasis or fibrosis in the lung bases. No airspace disease or consolidation. No  parenchymal mass lesion identified. Mild emphysematous changes in the upper lungs. No pleural effusions. No pneumothorax.  CT ABDOMEN AND PELVIS FINDINGS  The unenhanced appearance of the liver, spleen, gallbladder, pancreas, adrenal glands, is unremarkable. Kidneys appear atrophic without hydronephrosis. Multiple bilateral renal mass lesions probably representing cysts. Calcification of the aorta without aneurysm. There is extensive lymphadenopathy in the retroperitoneum involving predominantly the pericaval region and extending from the abdominal hiatus down into the upper pelvis. Conglomerate mass of lymph nodes in the pericaval region measures up to about 14.3 x 12.3 x 7.4 cm. Additional enlarged lymph nodes are also demonstrated in the periaortic regions. With the large lymph node mass displaces the aorta and partially surrounds the aorta. Inferior vena cava cannot be distinguished from the mass. This is likely to represent lymphoma although sarcoma or other retroperitoneal mass could also have this appearance. Stomach, small bowel, and colon are not abnormally distended. No free air or free fluid in the abdomen. Mild infiltration in the mesenteries probably reactive. Abdominal wall musculature appears intact.  Pelvis: Prostate gland is mildly enlarged. Bladder wall is not thickened. Appendix is not identified. No free or loculated pelvic fluid collections. Small bilateral inguinal hernias containing fat.  Bones: Diffuse degenerative changes throughout the thoracic and lumbar spine. No vertebral compression deformities. No destructive bone lesions. Degenerative changes in the hips. Sacrum and pelvis appear intact.  IMPRESSION: No evidence of active pulmonary disease. 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 Recommend annual imaging followup by CTA or MRA. This recommendation follows 2010 ACCF/AHA/AATS/ACR/ASA/SCA/SCAI/SIR/STS/SVM Guidelines for the Diagnosis and Management of Patients with Thoracic Aortic  Disease. Circulation. 2010; 121: W737-T062  Prominent retroperitoneal lymphadenopathy most likely to represent lymphoma. Multiple bilateral renal lesions likely representing cysts. Bilateral inguinal hernias containing fat.   Electronically Signed   By: Lucienne Capers M.D.   On: 12/03/2014 23:15   Dg Chest 2 View  12/03/2014   CLINICAL DATA:  Fever and tachycardia  EXAM: CHEST  2 VIEW  COMPARISON:  None.  FINDINGS: The lungs are mildly hyperexpanded. There is scarring in the right base. There is mild central peribronchial thickening. There is a nipple shadow on the right. There is no edema or consolidation. The heart size is normal. The pulmonary vascularity is within normal limits. There is atherosclerotic change in aorta. There is no adenopathy. There is degenerative change in the thoracic spine.  IMPRESSION: Mild scarring right base. No edema or consolidation. Lungs mildly hyperexpanded with mild central peribronchial thickening. Suspect a degree of chronic bronchitis.   Electronically Signed   By: Lowella Grip III M.D.  On: 12/03/2014 16:55   Dg Knee 1-2 Views Left  12/04/2014   CLINICAL DATA:  Medial left knee swelling at distal femur. Pain with weight-bearing. No injury.  EXAM: LEFT KNEE - 1-2 VIEW  COMPARISON:  None.  FINDINGS: Examination demonstrates moderate osteoarthritic change involving the lateral compartment and to a lesser extent involving the medial compartment and patellofemoral joints. No evidence of fracture or dislocation. Suggestion of a significant joint effusion. Mild calcified plaque over the femoral artery.  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 Significant joint effusion.   Electronically Signed   By: Marin Olp M.D.   On: 12/04/2014 13:41   Ct Chest Wo Contrast  12/03/2014   CLINICAL DATA:  Generalized fatigue and cough for a week. No appetite.  EXAM: CT CHEST, ABDOMEN AND PELVIS WITHOUT CONTRAST   TECHNIQUE: Multidetector CT imaging of the chest, abdomen and pelvis was performed following the standard protocol without IV contrast.  COMPARISON:  None.  FINDINGS: CT CHEST FINDINGS  Normal heart size. Coronary artery calcifications. 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 Aortic calcification. No definite lymphadenopathy in the chest although non conscious examination limits evaluation of hilar lymph nodes. Esophagus is decompressed.  Mild linear atelectasis or fibrosis in the lung bases. No airspace disease or consolidation. No parenchymal mass lesion identified. Mild emphysematous changes in the upper lungs. No pleural effusions. No pneumothorax.  CT ABDOMEN AND PELVIS FINDINGS  The unenhanced appearance of the liver, spleen, gallbladder, pancreas, adrenal glands, is unremarkable. Kidneys appear atrophic without hydronephrosis. Multiple bilateral renal mass lesions probably representing cysts. Calcification of the aorta without aneurysm. There is extensive lymphadenopathy in the retroperitoneum involving predominantly the pericaval region and extending from the abdominal hiatus down into the upper pelvis. Conglomerate mass of lymph nodes in the pericaval region measures up to about 14.3 x 12.3 x 7.4 cm. Additional enlarged lymph nodes are also demonstrated in the periaortic regions. With the large lymph node mass displaces the aorta and partially surrounds the aorta. Inferior vena cava cannot be distinguished from the mass. This is likely to represent lymphoma although sarcoma or other retroperitoneal mass could also have this appearance. Stomach, small bowel, and colon are not abnormally distended. No free air or free fluid in the abdomen. Mild infiltration in the mesenteries probably reactive. Abdominal wall musculature appears intact.  Pelvis: Prostate gland is mildly enlarged. Bladder wall is not thickened. Appendix is not identified. No free or loculated pelvic fluid collections. Small bilateral  inguinal hernias containing fat.  Bones: Diffuse degenerative changes throughout the thoracic and lumbar spine. No vertebral compression deformities. No destructive bone lesions. Degenerative changes in the hips. Sacrum and pelvis appear intact.  IMPRESSION: No evidence of active pulmonary disease. 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 Recommend annual imaging followup by CTA or MRA. This recommendation follows 2010 ACCF/AHA/AATS/ACR/ASA/SCA/SCAI/SIR/STS/SVM Guidelines for the Diagnosis and Management of Patients with Thoracic Aortic Disease. Circulation. 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 Multiple bilateral renal lesions likely representing cysts. Bilateral inguinal hernias containing fat.   Electronically Signed   By: Lucienne Capers M.D.   On: 12/03/2014 23:15   Ct Biopsy  12/05/2014   CLINICAL DATA:  Confluent right retroperitoneal mass.  EXAM: CT GUIDED CORE BIOPSY OF RIGHT RETROPERITONEAL MASS  ANESTHESIA/SEDATION: Intravenous Fentanyl and Versed were administered as conscious sedation during continuous cardiorespiratory monitoring by the radiology RN, with a total moderate sedation time of 10 minutes.  PROCEDURE: The  procedure risks, benefits, and alternatives were explained to the patient. Questions regarding the procedure were encouraged and answered. The patient understands and consents to the procedure.  Patient placed prone. Select axial scans through the abdomen obtained. The lesion was localized and an appropriate skin entry site determined and marked.  The operative field was prepped with Betadinein a sterile fashion, and a sterile drape was applied covering the operative field. A sterile gown and sterile gloves were used for the procedure. Local anesthesia was provided with 1% Lidocaine.  Under CT fluoroscopic guidance, a 17 gauge trocar needle was advanced to the margin of the lesion. Once needle tip position was confirmed,  coaxial 18-gauge core biopsy samples were obtained, submitted in saline to surgical pathology. The guide needle was removed. The patient tolerated the procedure well.  COMPLICATIONS: None immediate  FINDINGS: Limited scans again confirm or confluent right retroperitoneal mass abutting the psoas musculature and aorta, and obscuring the IVC. CT-guided core biopsy samples were obtained without complication.  IMPRESSION: 1. Technically successful CT-guided core biopsy of right retroperitoneal mass.   Electronically Signed   By: Lucrezia Europe M.D.   On: 12/05/2014 13:46   Dg Chest Port 1 View  12/10/2014   CLINICAL DATA:  Wheezing  EXAM: PORTABLE CHEST - 1 VIEW  COMPARISON:  Chest radiograph and chest CT December 03, 2014  FINDINGS: There is stable mild scarring in the right base. Central peribronchial thickening remains. There is no frank edema or consolidation. Heart size and pulmonary vascularity are normal. No adenopathy. There is degenerative change in the thoracic spine.  IMPRESSION: Evidence of a degree of chronic bronchitis. Mild scarring right base. No edema or consolidation.   Electronically Signed   By: Lowella Grip III M.D.   On: 12/10/2014 10:44    Microbiology: No results found for this or any previous visit (from the past 240 hour(s)).   Labs: Basic Metabolic Panel:  Recent Labs Lab 12/08/14 0539 12/09/14 0550 12/10/14 0404 12/11/14 0411 12/12/14 0413  NA 139 138 140 135 134*  K 3.7 3.9 4.0 4.4 4.3  CL 103 101 101 98* 98*  CO2 30 29 29 27 28   GLUCOSE 128* 149* 102* 293* 308*  BUN 34* 38* 44* 44* 52*  CREATININE 3.57* 3.19* 3.11* 3.02* 3.09*  CALCIUM 12.5* 12.5* 11.8* 10.5* 10.0  PHOS  --   --  4.8* 3.0 3.5   Liver Function Tests:  Recent Labs Lab 12/06/14 0437 12/07/14 0644 12/08/14 0539 12/09/14 0550 12/10/14 0404 12/11/14 0411 12/12/14 0413  AST 20 23 17 18   --   --   --   ALT 15* 16* 15* 16*  --   --   --   ALKPHOS 50 78 79 100  --   --   --   BILITOT 0.5 0.7 0.7  0.4  --   --   --   PROT 5.6* 6.8 6.1* 6.2*  --   --   --   ALBUMIN 2.8* 3.3* 2.9* 3.0* 3.3* 3.1* 3.2*   No results for input(s): LIPASE, AMYLASE in the last 168 hours. No results for input(s): AMMONIA in the last 168 hours. CBC:  Recent Labs Lab 12/11/14 1040  WBC 6.7  NEUTROABS 5.9  HGB 12.2*  HCT 36.8*  MCV 89.1  PLT 286   Cardiac Enzymes: No results for input(s): CKTOTAL, CKMB, CKMBINDEX, TROPONINI in the last 168 hours. BNP: BNP (last 3 results)  Recent Labs  12/03/14 1631  BNP 102.8*  ProBNP (last 3 results) No results for input(s): PROBNP in the last 8760 hours.  CBG:  Recent Labs Lab 12/11/14 0705 12/11/14 1125 12/11/14 1726 12/11/14 2117 12/12/14 0707  GLUCAP 255* 218* 246* 339* 265*       Signed:  Velvet Bathe  [facility] 12/12/2014, 11:32 AM

## 2014-12-12 NOTE — Progress Notes (Signed)
IP PROGRESS NOTE  Subjective:   Billy Gray tolerated the chemotherapy without acute side effects. He feels well. He would like to go home.  Objective: Vital signs in last 24 hours: Blood pressure 133/80, pulse 86, temperature 97.6 F (36.4 C), temperature source Oral, resp. rate 20, height 5\' 8"  (1.727 m), weight 218 lb 7.6 oz (99.1 kg), SpO2 95 %.  Intake/Output from previous day: 06/22 0701 - 06/23 0700 In: 1703 [P.O.:1230; I.V.:473] Out: 750 [Urine:750]  Physical Exam:  HEENT: No thrush Lungs: Distant breath sounds, no respiratory distress Cardiac: Regular rhythm, distant heart sounds Extremities: Trace low leg edema bilaterally    BMET  Recent Labs  12/11/14 0411 12/12/14 0413  NA 135 134*  K 4.4 4.3  CL 98* 98*  CO2 27 28  GLUCOSE 293* 308*  BUN 44* 52*  CREATININE 3.02* 3.09*  CALCIUM 10.5* 10.0   uric acid 7.1  LDH 139 on 12/09/2014    Studies/Results: Dg Chest Port 1 View  12/10/2014   CLINICAL DATA:  Wheezing  EXAM: PORTABLE CHEST - 1 VIEW  COMPARISON:  Chest radiograph and chest CT December 03, 2014  FINDINGS: There is stable mild scarring in the right base. Central peribronchial thickening remains. There is no frank edema or consolidation. Heart size and pulmonary vascularity are normal. No adenopathy. There is degenerative change in the thoracic spine.  IMPRESSION: Evidence of a degree of chronic bronchitis. Mild scarring right base. No edema or consolidation.   Electronically Signed   By: Lowella Grip III M.D.   On: 12/10/2014 10:44    Medications: I have reviewed the patient's current medications.  Assessment/Plan:  1. Non-Hodgkin's lymphoma, low-grade B cell lymphoma, CD20 and CD10 positive, on a core biopsy of a retroperitoneal mass 12/05/2014  CTs of the chest, abdomen, and pelvis on 12/03/2014 with a conglomerate mass in the pericaval region and retroperitoneal lymphadenopathy  Cycle 1 rituximab/Cytoxan 12/11/2014 (prednisone held secondary  to course of prednisone/Solu-Medrol received during this hospital admission)  2. Hypercalcemia of malignancy-resolved  3. Renal failure  4. COPD, acute COPD flare-improved  5.  Diabetes  6.  Diabetic neuropathy  7.  Left knee arthritis  Billy Gray tolerated the chemotherapy well. He is stable for discharge from an oncology standpoint. We will arrange for outpatient follow-up at the [facility] with the plan to administer cycle 2 systemic therapy in approximately 3 weeks.  He should complete 1 week of outpatient allopurinol.    LOS: 9 days   Billy Gray  12/12/2014, 8:26 AM

## 2014-12-12 NOTE — Clinical Documentation Improvement (Signed)
Per chart Height 5 feet 8 inches, weight 218 pounds 7.6 ounces; BMI 33.23.  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 Consdier nutrition evaluation." Please identify the severity of the malnutrition if known (mild, moderate, severity) and document in your progress note and carry over to the discharge summary.    Thank you, Mateo Flow, RN [phone] Clinical Documentation Specialist

## 2014-12-19 ENCOUNTER — Other Ambulatory Visit (HOSPITAL_BASED_OUTPATIENT_CLINIC_OR_DEPARTMENT_OTHER): Payer: Medicare Other

## 2014-12-19 DIAGNOSIS — C8290 Follicular lymphoma, unspecified, unspecified site: Secondary | ICD-10-CM | POA: Diagnosis not present

## 2014-12-19 LAB — COMPREHENSIVE METABOLIC PANEL (CC13)
ALT: 20 U/L (ref 0–55)
ANION GAP: 11 meq/L (ref 3–11)
AST: 20 U/L (ref 5–34)
Albumin: 2.9 g/dL — ABNORMAL LOW (ref 3.5–5.0)
Alkaline Phosphatase: 147 U/L (ref 40–150)
BUN: 26.8 mg/dL — AB (ref 7.0–26.0)
CO2: 28 mEq/L (ref 22–29)
CREATININE: 2.7 mg/dL — AB (ref 0.7–1.3)
Calcium: 11.1 mg/dL — ABNORMAL HIGH (ref 8.4–10.4)
Chloride: 98 mEq/L (ref 98–109)
EGFR: 23 mL/min/{1.73_m2} — ABNORMAL LOW (ref >90)
Glucose: 486 mg/dl — ABNORMAL HIGH (ref 70–140)
Sodium: 136 mEq/L (ref 136–145)
Total Bilirubin: 0.44 mg/dL (ref 0.20–1.20)
Total Protein: 6.3 g/dL — ABNORMAL LOW (ref 6.4–8.3)

## 2014-12-19 LAB — CBC WITH DIFFERENTIAL/PLATELET
BASO%: 0.1 % (ref 0.0–2.0)
Basophils Absolute: 0 10*3/uL (ref 0.0–0.1)
EOS ABS: 0.4 10*3/uL (ref 0.0–0.5)
EOS%: 5.4 % (ref 0.0–7.0)
HCT: 39 % (ref 38.4–49.9)
HEMOGLOBIN: 13.1 g/dL (ref 13.0–17.1)
LYMPH%: 10.3 % — ABNORMAL LOW (ref 14.0–49.0)
MCH: 30.3 pg (ref 27.2–33.4)
MCHC: 33.6 g/dL (ref 32.0–36.0)
MCV: 90.3 fL (ref 79.3–98.0)
MONO#: 0.7 10*3/uL (ref 0.1–0.9)
MONO%: 9.3 % (ref 0.0–14.0)
NEUT%: 74.9 % (ref 39.0–75.0)
NEUTROS ABS: 5.7 10*3/uL (ref 1.5–6.5)
PLATELETS: 213 10*3/uL (ref 140–400)
RBC: 4.32 10*6/uL (ref 4.20–5.82)
RDW: 13.8 % (ref 11.0–14.6)
WBC: 7.7 10*3/uL (ref 4.0–10.3)
lymph#: 0.8 10*3/uL — ABNORMAL LOW (ref 0.9–3.3)
nRBC: 0 % (ref 0–0)

## 2014-12-20 ENCOUNTER — Other Ambulatory Visit: Payer: Self-pay | Admitting: Nurse Practitioner

## 2014-12-20 ENCOUNTER — Telehealth: Payer: Self-pay | Admitting: *Deleted

## 2014-12-20 ENCOUNTER — Other Ambulatory Visit: Payer: Self-pay | Admitting: Pharmacist

## 2014-12-20 ENCOUNTER — Ambulatory Visit (HOSPITAL_BASED_OUTPATIENT_CLINIC_OR_DEPARTMENT_OTHER): Payer: Medicare Other

## 2014-12-20 ENCOUNTER — Other Ambulatory Visit (HOSPITAL_BASED_OUTPATIENT_CLINIC_OR_DEPARTMENT_OTHER): Payer: Medicare Other

## 2014-12-20 ENCOUNTER — Ambulatory Visit (HOSPITAL_BASED_OUTPATIENT_CLINIC_OR_DEPARTMENT_OTHER): Payer: Medicare Other | Admitting: Nurse Practitioner

## 2014-12-20 ENCOUNTER — Other Ambulatory Visit: Payer: Self-pay | Admitting: *Deleted

## 2014-12-20 ENCOUNTER — Ambulatory Visit (HOSPITAL_COMMUNITY)
Admission: RE | Admit: 2014-12-20 | Discharge: 2014-12-20 | Disposition: A | Discharge status: Home / Self Care | Payer: Medicare Other | Source: Ambulatory Visit | Attending: Nurse Practitioner | Admitting: Nurse Practitioner

## 2014-12-20 DIAGNOSIS — C8290 Follicular lymphoma, unspecified, unspecified site: Secondary | ICD-10-CM | POA: Diagnosis not present

## 2014-12-20 DIAGNOSIS — N179 Acute kidney failure, unspecified: Secondary | ICD-10-CM | POA: Diagnosis not present

## 2014-12-20 DIAGNOSIS — N189 Chronic kidney disease, unspecified: Secondary | ICD-10-CM

## 2014-12-20 DIAGNOSIS — R600 Localized edema: Secondary | ICD-10-CM | POA: Insufficient documentation

## 2014-12-20 DIAGNOSIS — R609 Edema, unspecified: Secondary | ICD-10-CM

## 2014-12-20 LAB — COMPREHENSIVE METABOLIC PANEL (CC13)
ALT: 19 U/L (ref 0–55)
AST: 13 U/L (ref 5–34)
Albumin: 3 g/dL — ABNORMAL LOW (ref 3.5–5.0)
Alkaline Phosphatase: 140 U/L (ref 40–150)
Anion Gap: 8 mEq/L (ref 3–11)
BILIRUBIN TOTAL: 0.43 mg/dL (ref 0.20–1.20)
BUN: 22.8 mg/dL (ref 7.0–26.0)
CALCIUM: 10.9 mg/dL — AB (ref 8.4–10.4)
CO2: 33 mEq/L — ABNORMAL HIGH (ref 22–29)
Chloride: 98 mEq/L (ref 98–109)
Creatinine: 2.4 mg/dL — ABNORMAL HIGH (ref 0.7–1.3)
EGFR: 26 mL/min/{1.73_m2} — ABNORMAL LOW (ref >90)
Glucose: 207 mg/dl — ABNORMAL HIGH (ref 70–140)
POTASSIUM: 4.4 meq/L (ref 3.5–5.1)
Sodium: 139 mEq/L (ref 136–145)
TOTAL PROTEIN: 6 g/dL — AB (ref 6.4–8.3)

## 2014-12-20 LAB — WHOLE BLOOD GLUCOSE
Glucose: 203 mg/dL — ABNORMAL HIGH (ref 70–100)
HRS PC: 5 Hours

## 2014-12-20 MED ORDER — SODIUM CHLORIDE 0.9 % IV SOLN
INTRAVENOUS | Status: DC
  Administered 2014-12-20: 14:00:00 via INTRAVENOUS

## 2014-12-20 MED ORDER — SODIUM CHLORIDE 0.9 % IV SOLN
90.0000 mg | Freq: Once | INTRAVENOUS | Status: AC
  Administered 2014-12-20: 90 mg via INTRAVENOUS
  Filled 2014-12-20: qty 10

## 2014-12-20 NOTE — Patient Instructions (Signed)
Pamidronate injection What is this medicine? PAMIDRONATE (pa mi DROE nate) slows calcium loss from bones. It is used to treat high calcium blood levels from cancer or Paget's disease. It is also used to treat bone pain and prevent fractures from certain cancers that have spread to the bone. This medicine may be used for other purposes; ask your health care provider or pharmacist if you have questions. COMMON BRAND NAME(S): Aredia What should I tell my health care provider before I take this medicine? They need to know if you have any of these conditions: -aspirin-sensitive asthma -dental disease -kidney disease -an unusual or allergic reaction to pamidronate, other medicines, foods, dyes, or preservatives -pregnant or trying to get pregnant -breast-feeding How should I use this medicine? This medicine is for infusion into a vein. It is given by a health care professional in a hospital or clinic setting. Talk to your pediatrician regarding the use of this medicine in children. This medicine is not approved for use in children. Overdosage: If you think you have taken too much of this medicine contact a poison control center or emergency room at once. NOTE: This medicine is only for you. Do not share this medicine with others. What if I miss a dose? This does not apply. What may interact with this medicine? -certain antibiotics given by injection -medicines for inflammation or pain like ibuprofen, naproxen -some diuretics like bumetanide, furosemide -cyclosporine -parathyroid hormone -tacrolimus -teriparatide -thalidomide This list may not describe all possible interactions. Give your health care provider a list of all the medicines, herbs, non-prescription drugs, or dietary supplements you use. Also tell them if you smoke, drink alcohol, or use illegal drugs. Some items may interact with your medicine. What should I watch for while using this medicine? Visit your doctor or health care  professional for regular checkups. It may be some time before you see the benefit from this medicine. Do not stop taking your medicine unless your doctor tells you to. Your doctor may order blood tests or other tests to see how you are doing. Women should inform their doctor if they wish to become pregnant or think they might be pregnant. There is a potential for serious side effects to an unborn child. Talk to your health care professional or pharmacist for more information. You should make sure that you get enough calcium and vitamin D while you are taking this medicine. Discuss the foods you eat and the vitamins you take with your health care professional. Some people who take this medicine have severe bone, joint, and/or muscle pain. This medicine may also increase your risk for a broken thigh bone. Tell your doctor right away if you have pain in your upper leg or groin. Tell your doctor if you have any pain that does not go away or that gets worse. What side effects may I notice from receiving this medicine? Side effects that you should report to your doctor or health care professional as soon as possible: -allergic reactions like skin rash, itching or hives, swelling of the face, lips, or tongue -black or tarry stools -changes in vision -eye inflammation, pain -high blood pressure -jaw pain, especially burning or cramping -muscle weakness -numb, tingling pain -swelling of feet or hands -trouble passing urine or change in the amount of urine -unable to move easily Side effects that usually do not require medical attention (report to your doctor or health care professional if they continue or are bothersome): -bone, joint, or muscle pain -constipation -dizzy, drowsy -  fever -headache -loss of appetite -nausea, vomiting -pain at site where injected This list may not describe all possible side effects. Call your doctor for medical advice about side effects. You may report side effects to  FDA at 1-800-FDA-1088. Where should I keep my medicine? This drug is given in a hospital or clinic and will not be stored at home. NOTE: This sheet is a summary. It may not cover all possible information. If you have questions about this medicine, talk to your doctor, pharmacist, or health care provider.  2015, Elsevier/Gold Standard. (2010-12-04 08:49:49)  

## 2014-12-20 NOTE — Progress Notes (Signed)
VASCULAR LAB PRELIMINARY  PRELIMINARY  PRELIMINARY  PRELIMINARY  Left lower extremity venous duplex completed.    Preliminary report:  Left:  No evidence of DVT, superficial thrombosis, or Baker's cyst.  Emry Tobin, RVT 12/20/2014, 5:20 PM

## 2014-12-20 NOTE — Progress Notes (Signed)
Patient reports swelling in both legs that is worse than when he left the hospital.  Left is worse than right.

## 2014-12-20 NOTE — Telephone Encounter (Signed)
Per Dr. Benay Spice; notified pt that his calcium is again high and he needs to come in today for infusion of pamidronate.  Pt verbalized understanding and states he can be here around 1pm.

## 2014-12-20 NOTE — Telephone Encounter (Signed)
-----   Message from Ladell Pier, MD sent at 12/19/2014  9:10 PM EDT ----- Please call patient, calcium is again high, schedule for cmet and pamidronate(90mg ) per pharmacy protocol ASAP

## 2014-12-27 ENCOUNTER — Telehealth: Payer: Self-pay | Admitting: Nurse Practitioner

## 2014-12-27 ENCOUNTER — Encounter: Payer: Self-pay | Admitting: Nurse Practitioner

## 2014-12-27 DIAGNOSIS — R609 Edema, unspecified: Secondary | ICD-10-CM | POA: Insufficient documentation

## 2014-12-27 NOTE — Assessment & Plan Note (Signed)
Patient received cycle 1, day 1 of his CVP/Rituxan chemotherapy on 12/11/2014.  Patient is scheduled to return on 01/02/2015 for visit and his next chemotherapy.

## 2014-12-27 NOTE — Telephone Encounter (Signed)
Called patient to check in with him.  He states that his bilateral lower extremity edema has greatly improved within this past week.  He denies any chest pain or chest pressure.  He continues to deny any shortness of breath or pain with inspiration as well.  Advised patient to follow-up with his primary care provider for further management of his blood pressure medications and question need for any further diuretics.

## 2014-12-27 NOTE — Assessment & Plan Note (Signed)
Patient is reporting increased bilateral peripheral edema for the past week or so.  He states that he was instructed per the hospitalist prior to his most recent discharge from the hospital to discontinue both the lisinopril and the hydrochlorothiazide due to both being nephrotoxic.  He was instructed to initiate Norvasc 5 mg daily instead.  On exam.  It does appear that patient has +2 peripheral edema to his bilateral lower extremities; with a slight increased edema to the left lower extremity versus the right.  Doppler ultrasound obtained of the left leg was negative for DVT.  Most likely, the change in blood pressure medications could very well have attributed to patient's increased edema in his extremities.  Patient continues to deny any chest pain, chest pressure, shortness breath, or pain with inspiration.  Patient was advised to elevate his feet above the level of his heart as often as possible.  He was also encouraged to try compression hose as well.  Patient was also encouraged to follow-up with his primary care physician in regards to his blood pressure medications.  He may very well require adjustment/changing of his blood pressure medications to deal with his chronic issues of peripheral edema.  He should was encouraged also to call/return of electrical to the emergency department for any worsening symptoms whatsoever.

## 2014-12-27 NOTE — Assessment & Plan Note (Signed)
Patient has history of chronic renal insufficiency.  Creatinine has improved from 2.7 down to 2.4.  Will continue to monitor closely.

## 2014-12-27 NOTE — Progress Notes (Signed)
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  Currently undergoing CVP/Rituxan chemotherapy regimen.  Patient is reporting increased bilateral peripheral edema for the past week or so.  He states that he was instructed per the hospitalist prior to his most recent discharge from the hospital to discontinue both the lisinopril and the hydrochlorothiazide due to both being nephrotoxic.  He was instructed to initiate Norvasc 5 mg daily instead.  Patient has also been found to be hypercalcemic; and presents to the [facility] today to receive pamidronate to decrease his calcium level.  Patient denies any other new symptoms whatsoever.  He denies any recent fevers or chills.  HPI  ROS  Past Medical History  Diagnosis Date  . COPD (chronic obstructive pulmonary disease)   . Hypertension   . Diabetes mellitus without complication   . Renal disorder     History reviewed. No pertinent past surgical history.  has Acute on chronic kidney failure; Hypercalcemia; HTN (hypertension); DM2 (diabetes mellitus, type 2); Retroperitoneal lymphadenopathy; Lymphoma, follicular; Chronic respiratory failure; COPD with acute exacerbation; and Peripheral edema on his problem list.    has No Known Allergies.    Medication List       This list is accurate as of: 12/20/14 11:59 PM.  Always use your most recent med list.               acetaminophen 500 MG tablet  Commonly known as:  TYLENOL  Take 500 mg by mouth every 6 (six) hours as needed for moderate pain.     albuterol 108 (90 BASE) MCG/ACT inhaler  Commonly known as:  PROVENTIL HFA;VENTOLIN HFA  Inhale 2 puffs into the lungs every 6 (six) hours as needed for wheezing or shortness of breath.     allopurinol 100 MG tablet  Commonly known as:  ZYLOPRIM  Take 1 tablet (100 mg total) by mouth daily.     amLODipine 5 MG tablet  Commonly known as:  NORVASC  Take 1 tablet (5 mg total) by  mouth daily.     budesonide-formoterol 160-4.5 MCG/ACT inhaler  Commonly known as:  SYMBICORT  Inhale 2 puffs into the lungs every evening.     insulin glargine 100 UNIT/ML injection  Commonly known as:  LANTUS  Inject 0.15 mLs (15 Units total) into the skin at bedtime.     pravastatin 40 MG tablet  Commonly known as:  PRAVACHOL  Take 40 mg by mouth every evening.     traZODone 50 MG tablet  Commonly known as:  DESYREL  Take 50 mg by mouth at bedtime.     zolpidem 10 MG tablet  Commonly known as:  AMBIEN  Take 10 mg by mouth at bedtime.         PHYSICAL EXAMINATION  Oncology Vitals 12/20/2014 12/12/2014 12/12/2014 12/12/2014 12/11/2014 12/11/2014 12/11/2014  Height - - - - - - -  Weight - - - - - - -  Weight (lbs) - - - - - - -  BMI (kg/m2) - - - - - - -  Temp 98.1 - - 97.6 - 98.8 -  Pulse 74 - - 86 90 91 -  Resp 20 - - 20 - 20 -  SpO2 100 95 95 95 - 97 97  BSA (m2) - - - - - - -   BP Readings from Last 3 Encounters:  12/20/14 153/64  12/12/14 133/80    Physical Exam  Constitutional: He is  oriented to person, place, and time and well-developed, well-nourished, and in no distress.  HENT:  Head: Normocephalic and atraumatic.  Mouth/Throat: Oropharynx is clear and moist.  Eyes: Conjunctivae and EOM are normal. Pupils are equal, round, and reactive to light. Right eye exhibits no discharge. Left eye exhibits no discharge. No scleral icterus.  Neck: Normal range of motion. Neck supple. No JVD present. No tracheal deviation present. No thyromegaly present.  Cardiovascular: Normal rate, regular rhythm, normal heart sounds and intact distal pulses.   Pulmonary/Chest: Effort normal and breath sounds normal. No respiratory distress. He has no wheezes. He has no rales. He exhibits no tenderness.  Abdominal: Soft. Bowel sounds are normal. He exhibits no distension and no mass. There is no tenderness. There is no rebound and no guarding.  Musculoskeletal: Normal range of motion. He  exhibits edema. He exhibits no tenderness.  +2 edema to bilateral lower extremities; with the left slightly greater than the right.  Lymphadenopathy:    He has no cervical adenopathy.  Neurological: He is alert and oriented to person, place, and time. Gait normal.  Skin: Skin is warm and dry. No rash noted. No erythema. No pallor.  Psychiatric: Affect normal.  Nursing note and vitals reviewed.   LABORATORY DATA:. Appointment on 12/20/2014  Component Date Value Ref Range Status  . Sodium 12/20/2014 139  136 - 145 mEq/L Final  . Potassium 12/20/2014 4.4  3.5 - 5.1 mEq/L Final  . Chloride 12/20/2014 98  98 - 109 mEq/L Final  . CO2 12/20/2014 33* 22 - 29 mEq/L Final  . Glucose 12/20/2014 207* 70 - 140 mg/dl Final  . BUN 12/20/2014 22.8  7.0 - 26.0 mg/dL Final  . Creatinine 12/20/2014 2.4* 0.7 - 1.3 mg/dL Final  . Total Bilirubin 12/20/2014 0.43  0.20 - 1.20 mg/dL Final  . Alkaline Phosphatase 12/20/2014 140  40 - 150 U/L Final  . AST 12/20/2014 13  5 - 34 U/L Final  . ALT 12/20/2014 19  0 - 55 U/L Final  . Total Protein 12/20/2014 6.0* 6.4 - 8.3 g/dL Final  . Albumin 12/20/2014 3.0* 3.5 - 5.0 g/dL Final  . Calcium 12/20/2014 10.9* 8.4 - 10.4 mg/dL Final  . Anion Gap 12/20/2014 8  3 - 11 mEq/L Final  . EGFR 12/20/2014 26* >90 ml/min/1.73 m2 Final   eGFR is calculated using the CKD-EPI Creatinine Equation (2009)  . Glucose 12/20/2014 203* 70 - 100 mg/dL Final  . HRS PC 12/20/2014 5.0   Final  Appointment on 12/19/2014  Component Date Value Ref Range Status  . WBC 12/19/2014 7.7  4.0 - 10.3 10e3/uL Final  . NEUT# 12/19/2014 5.7  1.5 - 6.5 10e3/uL Final  . HGB 12/19/2014 13.1  13.0 - 17.1 g/dL Final  . HCT 12/19/2014 39.0  38.4 - 49.9 % Final  . Platelets 12/19/2014 213  140 - 400 10e3/uL Final  . MCV 12/19/2014 90.3  79.3 - 98.0 fL Final  . MCH 12/19/2014 30.3  27.2 - 33.4 pg Final  . MCHC 12/19/2014 33.6  32.0 - 36.0 g/dL Final  . RBC 12/19/2014 4.32  4.20 - 5.82 10e6/uL Final  .  RDW 12/19/2014 13.8  11.0 - 14.6 % Final  . lymph# 12/19/2014 0.8* 0.9 - 3.3 10e3/uL Final  . MONO# 12/19/2014 0.7  0.1 - 0.9 10e3/uL Final  . Eosinophils Absolute 12/19/2014 0.4  0.0 - 0.5 10e3/uL Final  . Basophils Absolute 12/19/2014 0.0  0.0 - 0.1 10e3/uL Final  . NEUT% 12/19/2014 74.9  39.0 - 75.0 % Final  . LYMPH% 12/19/2014 10.3* 14.0 - 49.0 % Final  . MONO% 12/19/2014 9.3  0.0 - 14.0 % Final  . EOS% 12/19/2014 5.4  0.0 - 7.0 % Final  . BASO% 12/19/2014 0.1  0.0 - 2.0 % Final  . nRBC 12/19/2014 0  0 - 0 % Final  . Sodium 12/19/2014 136  136 - 145 mEq/L Final  . Potassium 12/19/2014 4.6 Sl. Hemolysis  3.5 - 5.1 mEq/L Final  . Chloride 12/19/2014 98  98 - 109 mEq/L Final  . CO2 12/19/2014 28  22 - 29 mEq/L Final  . Glucose 12/19/2014 486* 70 - 140 mg/dl Final  . BUN 12/19/2014 26.8* 7.0 - 26.0 mg/dL Final  . Creatinine 12/19/2014 2.7* 0.7 - 1.3 mg/dL Final  . Total Bilirubin 12/19/2014 0.44  0.20 - 1.20 mg/dL Final  . Alkaline Phosphatase 12/19/2014 147  40 - 150 U/L Final  . AST 12/19/2014 20  5 - 34 U/L Final  . ALT 12/19/2014 20  0 - 55 U/L Final  . Total Protein 12/19/2014 6.3* 6.4 - 8.3 g/dL Final  . Albumin 12/19/2014 2.9* 3.5 - 5.0 g/dL Final  . Calcium 12/19/2014 11.1* 8.4 - 10.4 mg/dL Final  . Anion Gap 12/19/2014 11  3 - 11 mEq/L Final  . EGFR 12/19/2014 23* >90 ml/min/1.73 m2 Final   eGFR is calculated using the CKD-EPI Creatinine Equation (2009)     RADIOGRAPHIC STUDIES: No results found.  ASSESSMENT/PLAN:    Acute on chronic kidney failure Patient has history of chronic renal insufficiency.  Creatinine has improved from 2.7 down to 2.4.  Will continue to monitor closely.  Hypercalcemia Patient continues with hypercalcemia.  Corrected calcium yesterday was 11.98.  Most likely, elevated calcium is actually hypercalcemia of malignancy.  Patient received Pamidronate infusion today or treatment of hypercalcemia.  We'll continue to monitor closely.    Lymphoma,  follicular Patient received cycle 1, day 1 of his CVP/Rituxan chemotherapy on 12/11/2014.  Patient is scheduled to return on 01/02/2015 for visit and his next chemotherapy.  Peripheral edema Patient is reporting increased bilateral peripheral edema for the past week or so.  He states that he was instructed per the hospitalist prior to his most recent discharge from the hospital to discontinue both the lisinopril and the hydrochlorothiazide due to both being nephrotoxic.  He was instructed to initiate Norvasc 5 mg daily instead.  On exam.  It does appear that patient has +2 peripheral edema to his bilateral lower extremities; with a slight increased edema to the left lower extremity versus the right.  Doppler ultrasound obtained of the left leg was negative for DVT.  Most likely, the change in blood pressure medications could very well have attributed to patient's increased edema in his extremities.  Patient continues to deny any chest pain, chest pressure, shortness breath, or pain with inspiration.  Patient was advised to elevate his feet above the level of his heart as often as possible.  He was also encouraged to try compression hose as well.  Patient was also encouraged to follow-up with his primary care physician in regards to his blood pressure medications.  He may very well require adjustment/changing of his blood pressure medications to deal with his chronic issues of peripheral edema.  He should was encouraged also to call/return of electrical to the emergency department for any worsening symptoms whatsoever.  Patient stated understanding of all instructions; and was in agreement with this plan of care. The patient  knows to call the clinic with any problems, questions or concerns.   Review/collaboration with Dr. Marin Olp (on call) regarding all aspects of patient's visit today.   Total time spent with patient was 40 minutes;  with greater than 75 percent of that time spent in face to  face counseling regarding patient's symptoms,  and coordination of care and follow up.  Disclaimer: This note was dictated with voice recognition software. Similar sounding words can inadvertently be transcribed and may not be corrected upon review.   Drue Second, NP 12/27/2014

## 2014-12-27 NOTE — Assessment & Plan Note (Signed)
Patient continues with hypercalcemia.  Corrected calcium yesterday was 11.98.  Most likely, elevated calcium is actually hypercalcemia of malignancy.  Patient received Pamidronate infusion today or treatment of hypercalcemia.  We'll continue to monitor closely.

## 2014-12-29 ENCOUNTER — Other Ambulatory Visit: Payer: Self-pay | Admitting: Oncology

## 2014-12-31 ENCOUNTER — Encounter: Payer: Self-pay | Admitting: Nurse Practitioner

## 2014-12-31 NOTE — Progress Notes (Signed)
I placed form from [place] on desk of [facility].

## 2015-01-02 ENCOUNTER — Telehealth: Payer: Self-pay | Admitting: Oncology

## 2015-01-02 ENCOUNTER — Other Ambulatory Visit: Payer: Self-pay | Admitting: *Deleted

## 2015-01-02 ENCOUNTER — Other Ambulatory Visit (HOSPITAL_BASED_OUTPATIENT_CLINIC_OR_DEPARTMENT_OTHER): Payer: Medicare Other

## 2015-01-02 ENCOUNTER — Ambulatory Visit (HOSPITAL_BASED_OUTPATIENT_CLINIC_OR_DEPARTMENT_OTHER): Payer: Medicare Other

## 2015-01-02 ENCOUNTER — Ambulatory Visit (HOSPITAL_BASED_OUTPATIENT_CLINIC_OR_DEPARTMENT_OTHER): Payer: Medicare Other | Admitting: Nurse Practitioner

## 2015-01-02 VITALS — BP 123/62 | HR 87 | Temp 97.5°F | Resp 16

## 2015-01-02 VITALS — BP 155/69 | HR 96 | Temp 98.5°F | Resp 18 | Ht 68.0 in | Wt 210.3 lb

## 2015-01-02 DIAGNOSIS — Z5111 Encounter for antineoplastic chemotherapy: Secondary | ICD-10-CM | POA: Diagnosis not present

## 2015-01-02 DIAGNOSIS — C8290 Follicular lymphoma, unspecified, unspecified site: Secondary | ICD-10-CM

## 2015-01-02 DIAGNOSIS — C8333 Diffuse large B-cell lymphoma, intra-abdominal lymph nodes: Secondary | ICD-10-CM | POA: Diagnosis not present

## 2015-01-02 DIAGNOSIS — Z5112 Encounter for antineoplastic immunotherapy: Secondary | ICD-10-CM

## 2015-01-02 DIAGNOSIS — E1140 Type 2 diabetes mellitus with diabetic neuropathy, unspecified: Secondary | ICD-10-CM

## 2015-01-02 DIAGNOSIS — N179 Acute kidney failure, unspecified: Secondary | ICD-10-CM

## 2015-01-02 DIAGNOSIS — C8590 Non-Hodgkin lymphoma, unspecified, unspecified site: Secondary | ICD-10-CM

## 2015-01-02 LAB — CBC WITH DIFFERENTIAL/PLATELET
BASO%: 0.7 % (ref 0.0–2.0)
Basophils Absolute: 0.1 10*3/uL (ref 0.0–0.1)
EOS ABS: 0.7 10*3/uL — AB (ref 0.0–0.5)
EOS%: 8 % — ABNORMAL HIGH (ref 0.0–7.0)
HCT: 36.8 % — ABNORMAL LOW (ref 38.4–49.9)
HGB: 12.3 g/dL — ABNORMAL LOW (ref 13.0–17.1)
LYMPH#: 1.9 10*3/uL (ref 0.9–3.3)
LYMPH%: 22.4 % (ref 14.0–49.0)
MCH: 30 pg (ref 27.2–33.4)
MCHC: 33.4 g/dL (ref 32.0–36.0)
MCV: 89.8 fL (ref 79.3–98.0)
MONO#: 1.3 10*3/uL — ABNORMAL HIGH (ref 0.1–0.9)
MONO%: 15.1 % — ABNORMAL HIGH (ref 0.0–14.0)
NEUT%: 53.8 % (ref 39.0–75.0)
NEUTROS ABS: 4.4 10*3/uL (ref 1.5–6.5)
PLATELETS: 258 10*3/uL (ref 140–400)
RBC: 4.1 10*6/uL — AB (ref 4.20–5.82)
RDW: 13.3 % (ref 11.0–14.6)
WBC: 8.3 10*3/uL (ref 4.0–10.3)

## 2015-01-02 LAB — COMPREHENSIVE METABOLIC PANEL (CC13)
ALBUMIN: 2.9 g/dL — AB (ref 3.5–5.0)
ALT: 9 U/L (ref 0–55)
ANION GAP: 5 meq/L (ref 3–11)
AST: 11 U/L (ref 5–34)
Alkaline Phosphatase: 104 U/L (ref 40–150)
BUN: 11.6 mg/dL (ref 7.0–26.0)
CALCIUM: 10.1 mg/dL (ref 8.4–10.4)
CO2: 38 mEq/L — ABNORMAL HIGH (ref 22–29)
CREATININE: 2.5 mg/dL — AB (ref 0.7–1.3)
Chloride: 97 mEq/L — ABNORMAL LOW (ref 98–109)
EGFR: 26 mL/min/{1.73_m2} — AB (ref >90)
GLUCOSE: 219 mg/dL — AB (ref 70–140)
POTASSIUM: 4.3 meq/L (ref 3.5–5.1)
Sodium: 140 mEq/L (ref 136–145)
TOTAL PROTEIN: 6 g/dL — AB (ref 6.4–8.3)
Total Bilirubin: 0.28 mg/dL (ref 0.20–1.20)

## 2015-01-02 MED ORDER — SODIUM CHLORIDE 0.9 % IV SOLN
375.0000 mg/m2 | Freq: Once | INTRAVENOUS | Status: AC
  Administered 2015-01-02: 800 mg via INTRAVENOUS
  Filled 2015-01-02: qty 80

## 2015-01-02 MED ORDER — SODIUM CHLORIDE 0.9 % IV SOLN
400.0000 mg/m2 | Freq: Once | INTRAVENOUS | Status: AC
  Administered 2015-01-02: 880 mg via INTRAVENOUS
  Filled 2015-01-02: qty 44

## 2015-01-02 MED ORDER — SODIUM CHLORIDE 0.9 % IV SOLN
Freq: Once | INTRAVENOUS | Status: AC
  Administered 2015-01-02: 12:00:00 via INTRAVENOUS

## 2015-01-02 MED ORDER — ACETAMINOPHEN 325 MG PO TABS
650.0000 mg | ORAL_TABLET | Freq: Once | ORAL | Status: AC
  Administered 2015-01-02: 650 mg via ORAL

## 2015-01-02 MED ORDER — DIPHENHYDRAMINE HCL 25 MG PO CAPS
50.0000 mg | ORAL_CAPSULE | Freq: Once | ORAL | Status: AC
  Administered 2015-01-02: 50 mg via ORAL

## 2015-01-02 MED ORDER — SODIUM CHLORIDE 0.9 % IV SOLN
Freq: Once | INTRAVENOUS | Status: AC
  Administered 2015-01-02: 12:00:00 via INTRAVENOUS
  Filled 2015-01-02: qty 8

## 2015-01-02 MED ORDER — PREDNISONE 20 MG PO TABS: 60.0000 mg | ORAL_TABLET | Freq: Every day | ORAL | Status: DC

## 2015-01-02 MED ORDER — DIPHENHYDRAMINE HCL 25 MG PO CAPS
ORAL_CAPSULE | ORAL | Status: AC
  Filled 2015-01-02: qty 2

## 2015-01-02 MED ORDER — ACETAMINOPHEN 325 MG PO TABS
ORAL_TABLET | ORAL | Status: AC
  Filled 2015-01-02: qty 2

## 2015-01-02 NOTE — Telephone Encounter (Signed)
lvm for pt regarding to todays appt.....pt ok and aware °

## 2015-01-02 NOTE — Progress Notes (Addendum)
[facility] OFFICE PROGRESS NOTE   Diagnosis:    INTERVAL HISTORY:   Mr. Betzold returns as scheduled. He was diagnosed with non-Hodgkin's lymphoma, low-grade B-cell lymphoma, CD20 and CD10 positive, status post biopsy of a retroperitoneal mass 12/05/2014. He presented with hypercalcemia. He completed cycle 1 Cytoxan/Rituxan 12/11/2014 (prednisone was held secondary to a course of prednisone/Solu-Medrol received during the hospitalization). He did not receive vincristine due to pre-existing neuropathy.  He presents today prior to proceeding with cycle 2. He denies nausea/vomiting. No mouth sores. No diarrhea or constipation. No abdominal pain. Appetite is better. Leg swelling is better. He reports blood sugars have been in the low 200 range. He is taking Lantus insulin. He does not have a sliding scale.  Objective:  Vital signs in last 24 hours:  Blood pressure 155/69, pulse 96, temperature 98.5 F (36.9 C), temperature source Oral, resp. rate 18, height 5\' 8"  (1.727 m), weight 210 lb 4.8 oz (95.391 kg), SpO2 92 %.    HEENT: No thrush or ulcers. Lymphatics: No palpable cervical, supra clavicular or axillary lymph nodes. Resp: Distant breath sounds. Cardio: Distant heart sounds. Regular rate and rhythm. GI: Abdomen soft and nontender. No mass. No organomegaly. Vascular: 1+ pitting edema below the right knee. Edema throughout the left leg.   Lab Results:  Lab Results  Component Value Date   WBC 8.3 01/02/2015   HGB 12.3* 01/02/2015   HCT 36.8* 01/02/2015   MCV 89.8 01/02/2015   PLT 258 01/02/2015   NEUTROABS 4.4 01/02/2015    Imaging:  No results found.  Medications: I have reviewed the patient's current medications.  Assessment/Plan: 1. Non-Hodgkin's lymphoma, low-grade B cell lymphoma, CD20 and CD10 positive, on a core biopsy of a retroperitoneal mass 12/05/2014  CTs of the chest, abdomen, and pelvis on 12/03/2014 with a conglomerate mass in the  pericaval region and retroperitoneal lymphadenopathy  Cycle 1 rituximab/Cytoxan 12/11/2014 (prednisone held secondary to course of prednisone/Solu-Medrol received during this hospital admission)  2. Hypercalcemia of malignancy; improved during the recent hospitalization. Recurrent hypercalcemia 12/20/2014 status post pamidronate.  3. Renal failure  4. COPD  5. Diabetes  6. Diabetic neuropathy  7. Left knee arthritis    Disposition: Mr. Neidhardt has completed 1 cycle of Cytoxan/Rituxan. Plan to proceed with cycle 2 today as scheduled. He will take prednisone 60 mg daily for 5 days. We discussed that the steroids will increase his blood sugar. He will monitor his blood sugars closely and contact our office with readings greater than 400.  Calcium is in normal range today. We will continue to monitor.  He requests further follow-up/treatment at our [facility] office as this will be much closer to his home. We will contact Dr. Whitney Muse to arrange.  We will be happy to see him in the future if needed.  Patient seen with Dr. Benay Spice.  Ned Card ANP/GNP-BC   01/02/2015  10:33 AM  This was a shared visit with Ned Card.  Mr. Heckler has noted clinical improvement since completing a first cycle of chemotherapy. The calcium remained elevated when he was here on 12/20/2014 and he received a dose of pamidronate. The plan is to proceed with a second cycle of CVP-rituximab today (vincristine has been held secondary to severe diabetic neuropathy)  Mr. Teed lives closer to [place] and would like to continue his care at [facility]. I will contact Dr.Penland to arrange for follow-up at the [facility]. Our plan was to obtain a restaging CT after 3-4  cycles of chemotherapy.  Mr. Geraldo is not scheduled for follow-up appointment here. We will be glad to see him in the future as needed.  Julieanne Manson, M.D.

## 2015-01-02 NOTE — Patient Instructions (Signed)
[facility] Discharge Instructions for Patients Receiving Chemotherapy  Today you received the following chemotherapy agents Rituxan and Cytoxan  To help prevent nausea and vomiting after your treatment, we encourage you to take your nausea medication as directed. If you develop nausea and vomiting that is not controlled by your nausea medication, call the clinic.   BELOW ARE SYMPTOMS THAT SHOULD BE REPORTED IMMEDIATELY:  *FEVER GREATER THAN 100.5 F  *CHILLS WITH OR WITHOUT FEVER  NAUSEA AND VOMITING THAT IS NOT CONTROLLED WITH YOUR NAUSEA MEDICATION  *UNUSUAL SHORTNESS OF BREATH  *UNUSUAL BRUISING OR BLEEDING  TENDERNESS IN MOUTH AND THROAT WITH OR WITHOUT PRESENCE OF ULCERS  *URINARY PROBLEMS  *BOWEL PROBLEMS  UNUSUAL RASH Items with * indicate a potential emergency and should be followed up as soon as possible.  Feel free to call the clinic you have any questions or concerns. The clinic phone number is [phone].  Please show the [facility] at check-in to the Emergency Department and triage nurse.

## 2015-01-02 NOTE — Progress Notes (Signed)
Per pt-received 1st Rituxan/Cytoxan in hospital 12/11/14

## 2015-01-03 ENCOUNTER — Encounter: Payer: Self-pay | Admitting: Oncology

## 2015-01-03 NOTE — Progress Notes (Signed)
I called and left a message for Billy Gray at [phone] to advise copy being mailed to the patient and if they can get a fax[facility] for [facility] I will fax to them when I am back from vacation.

## 2015-01-08 ENCOUNTER — Encounter (HOSPITAL_COMMUNITY): Payer: Self-pay | Admitting: Hematology & Oncology

## 2015-01-08 ENCOUNTER — Encounter (HOSPITAL_COMMUNITY): Payer: Medicare Other | Attending: Hematology & Oncology | Admitting: Hematology & Oncology

## 2015-01-08 VITALS — BP 142/87 | HR 90 | Temp 98.6°F | Resp 20 | Ht 68.0 in | Wt 210.0 lb

## 2015-01-08 DIAGNOSIS — C8513 Unspecified B-cell lymphoma, intra-abdominal lymph nodes: Secondary | ICD-10-CM

## 2015-01-08 DIAGNOSIS — C8590 Non-Hodgkin lymphoma, unspecified, unspecified site: Secondary | ICD-10-CM

## 2015-01-08 MED ORDER — AMLODIPINE BESYLATE 5 MG PO TABS: 5.0000 mg | ORAL_TABLET | Freq: Every day | ORAL | Status: DC

## 2015-01-08 MED ORDER — PREDNISONE 20 MG PO TABS: 60.0000 mg | ORAL_TABLET | Freq: Every day | ORAL | Status: DC

## 2015-01-08 NOTE — Patient Instructions (Signed)
[place] at [facility] Discharge Instructions  RECOMMENDATIONS MADE BY THE CONSULTANT AND ANY TEST RESULTS WILL BE SENT TO YOUR REFERRING PHYSICIAN.  Exam completed by Dr Whitney Muse today Return on 8/4 to continue chemotherapy You will also see Dr Whitney Muse that day.   I will send Lavella Hammock our social worker a Leisure centre manager. Please call the clinic if you have any questions or concerns  Thank you for choosing [place] at [facility] to provide your oncology and hematology care.  To afford each patient quality time with our provider, please arrive at least 15 minutes before your scheduled appointment time.    You need to re-schedule your appointment should you arrive 10 or more minutes late.  We strive to give you quality time with our providers, and arriving late affects you and other patients whose appointments are after yours.  Also, if you no show three or more times for appointments you may be dismissed from the clinic at the providers discretion.     Again, thank you for choosing [facility].  Our hope is that these requests will decrease the amount of time that you wait before being seen by our physicians.       _____________________________________________________________  Should you have questions after your visit to [facility], please contact our office at [phone] between the hours of 8:30 a.m. and 4:30 p.m.  Voicemails left after 4:30 p.m. will not be returned until the following business day.  For prescription refill requests, have your pharmacy contact our office.

## 2015-01-08 NOTE — Progress Notes (Signed)
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 male is here because of NHL. He lives closer to [place] and wishes to transfer care locally.  He has been treated with 2 cycles of Rituxan. Cytoxan and prednisone.   He is here today with his wife, Billy Gray. He has a nasal cannula and oxygen tank. He has been tired to the point that he does not want to do anything. He has not been sleeping well. He used to sleep with a CPAP machine, but hasn't since he got back from the hospital. He uses his oxygen at night. He has not felt sick, just tired.  He gets cold easily. He says he 'has not always been like this'. He stays active by making birdhouses.  Presented to the emergency department on 614 with weakness fatigue, cough he has a history of kidney problems and states he is followed at the [place] system. He was told several months prior to his presentation that his creatinine was "near needing dialysis." He never experienced abdominal pain, though his stomach used to be much firmer than it is now.  His appetite is better in the last 3-5 weeks.  He had chemo last week.  He does not have a port but he is okay having treatment done by IV.  He goes to the new [place] clinic in [place], he has an appointment next week. He mentions that his left knee needs  to be replaced though his [place] doctors were hesitant with his illness.  MEDICAL HISTORY:  Past Medical History  Diagnosis Date  . COPD (chronic obstructive pulmonary disease)   . Hypertension   . Diabetes mellitus without complication   . Chronic kidney disease     SURGICAL HISTORY: Past Surgical History  Procedure Laterality Date  . Replacement total knee      right  . Appendectomy      SOCIAL HISTORY: History   Social History  . Marital Status: Married    Spouse Name: N/A  . Number of Children: N/A  . Years of Education: N/A   Occupational History  . Not on file.   Social History Main Topics  . Smoking status: Former Smoker    Quit date: 09/07/2001  . Smokeless tobacco: Not on file  . Alcohol Use: No  . Drug Use: No  . Sexual Activity: Not on file   Other Topics Concern  . Not on file   Social History Narrative  Married for 45 years. 3 children 5 grandchildren Ex smoker, quit 13 years ago. Currently chews tobacco. ETOH, none. 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  FAMILY HISTORY: Family History  Problem Relation Age of Onset  . Diabetes Mother   . Diabetes Father   . Cancer Brother   . Diabetes Brother    has no family status information on file.  Mother  deceased 30 yo, due to heart failure during open heart surgery. Father deceased 18 yo, he was an alcoholic and had diabetes. He died in a [place] hospital. Brother died quickly from small cell cancer that started in his stomach. Sister living on a lot of medication.  ALLERGIES:  has No Known Allergies.  MEDICATIONS:  Current Outpatient Prescriptions  Medication Sig Dispense Refill  . acetaminophen (TYLENOL) 500 MG tablet Take 500 mg by mouth every 6 (six) hours as needed for moderate pain.    Marland Kitchen albuterol (PROVENTIL HFA;VENTOLIN HFA) 108 (90 BASE) MCG/ACT inhaler Inhale 2 puffs into the lungs every 6 (six) hours as  needed for wheezing or shortness of breath.    . allopurinol (ZYLOPRIM) 100 MG tablet Take 1 tablet (100 mg total) by mouth daily. (Patient taking differently: Take 150 mg by mouth daily. ) 30 tablet 0  . amLODipine (NORVASC) 5 MG tablet Take 1 tablet (5 mg total) by mouth daily. 30 tablet 0  . budesonide-formoterol (SYMBICORT) 160-4.5 MCG/ACT inhaler Inhale 2 puffs into the lungs every evening.    Marland Kitchen HYDROcodone-acetaminophen (NORCO/VICODIN) 5-325 MG per tablet     . insulin glargine (LANTUS) 100 UNIT/ML injection Inject 0.15 mLs (15 Units total) into the skin at bedtime. (Patient taking differently: Inject 30 Units into the skin at bedtime. ) 10 mL 0  . pravastatin (PRAVACHOL) 40 MG tablet Take 40 mg by mouth every evening.    . traZODone (DESYREL) 50 MG tablet Take 50 mg by mouth at bedtime.    Marland Kitchen zolpidem (AMBIEN) 10 MG tablet Take 10 mg by mouth at bedtime.    . predniSONE (DELTASONE) 20 MG tablet Take 3 tablets (60 mg total) by mouth daily with breakfast. Take for 5 days (Patient not taking: Reported on 01/08/2015) 15 tablet 0   No current facility-administered medications for this visit.    Review of Systems  Constitutional: Positive for malaise/fatigue.  HENT: Negative.   Eyes: Negative.   Respiratory: Positive for shortness of breath. Negative for cough.   Cardiovascular: Negative.   Gastrointestinal: Negative.   Genitourinary: Negative.   Musculoskeletal: Positive for back pain and joint pain.       Right knee replacement.  Skin: Negative.   Neurological: Negative.   Endo/Heme/Allergies: Negative.   Psychiatric/Behavioral: The patient has insomnia.   All other systems reviewed and are negative.  14 point ROS was done and is otherwise as detailed above or in HPI  PHYSICAL EXAMINATION:  ECOG PERFORMANCE STATUS: 1 - Symptomatic but completely ambulatory  Filed Vitals:   01/08/15 1400  BP: 142/87  Pulse: 90  Temp: 98.6 F (37 C)  Resp: 20   Filed Weights   01/08/15  1400  Weight: 210 lb (95.255 kg)    Physical Exam  Constitutional: He is oriented to person, place, and time and well-developed, well-nourished, and in no distress.  Wearing O2  HENT:  Head: Normocephalic and atraumatic.  Mouth/Throat: Oropharynx is clear and moist.  Eyes: Conjunctivae and EOM are normal. Pupils are equal, round, and reactive to light.  Neck: Normal range of motion. Neck supple. No tracheal deviation present. No thyromegaly present.  Cardiovascular: Normal rate, regular rhythm and normal heart sounds.   Pulmonary/Chest: Effort normal and breath sounds normal. No respiratory distress. He has no wheezes. He has no rales.  Abdominal: Soft. Bowel sounds are normal. He exhibits no distension and no mass. There is no tenderness. There is no rebound and no guarding.  Musculoskeletal: He exhibits edema.  Left knee small effusion. LLE pitted edema, about up to the knee. Left leg larger than the right. RLE pitted edema.  Right knee has surgical incision site.  Lymphadenopathy:    He has no cervical adenopathy.  Neurological: He is alert and oriented to person, place, and time. Gait normal.  Skin: Skin is warm and dry.  Psychiatric: Mood, memory, affect and judgment normal.  Nursing note and vitals reviewed.   LABORATORY DATA:  I have reviewed the data as listed Results for Billy, Gray (MRN [id number])   Ref. Range 01/02/2015 09:59 01/02/2015 09:59  Sodium Latest Ref Range: 136-145 mEq/L  140  Potassium Latest Ref Range: 3.5-5.1 mEq/L  4.3  Chloride Latest Ref Range: 98-109 mEq/L  97 (L)  CO2 Latest Ref Range: 22-29 mEq/L  38 (H)  BUN Latest Ref Range: 7.0-26.0 mg/dL  11.6  Creatinine Latest Ref Range: 0.7-1.3 mg/dL  2.5 (H)  Calcium Latest Ref Range: 8.4-10.4 mg/dL  10.1  Glucose Latest Ref Range: 70-140 mg/dl  219 (H)  Anion gap Latest Ref Range: 3-11 mEq/L  5  EGFR Latest Ref Range: >90 ml/min/1.73 m2  26 (L)  Alkaline Phosphatase Latest Ref Range: 40-150 U/L  104    Albumin Latest Ref Range: 3.5-5.0 g/dL  2.9 (L)  AST Latest Ref Range: 5-34 U/L  11  ALT Latest Ref Range: 0-55 U/L  9  Total Protein Latest Ref Range: 6.4-8.3 g/dL  6.0 (L)  Total Bilirubin Latest Ref Range: 0.20-1.20 mg/dL  0.28  WBC Latest Ref Range: 4.0-10.3 10e3/uL 8.3   RBC Latest Ref Range: 4.20-5.82 10e6/uL 4.10 (L)   Hemoglobin Latest Ref Range: 13.0-17.1 g/dL 12.3 (L)   HCT Latest Ref Range: 38.4-49.9 % 36.8 (L)   MCV Latest Ref Range: 79.3-98.0 fL 89.8   MCH Latest Ref Range: 27.2-33.4 pg 30.0   MCHC Latest Ref Range: 32.0-36.0 g/dL 33.4   RDW Latest Ref Range: 11.0-14.6 % 13.3   Platelets Latest Ref Range: 140-400 10e3/uL 258   NEUT% Latest Ref Range: 39.0-75.0 % 53.8   LYMPH% Latest Ref Range: 14.0-49.0 % 22.4   MONO% Latest Ref Range: 0.0-14.0 % 15.1 (H)   EOS% Latest Ref Range: 0.0-7.0 % 8.0 (H)   BASO% Latest Ref Range: 0.0-2.0 % 0.7   NEUT# Latest Ref Range: 1.5-6.5 10e3/uL 4.4   MONO# Latest Ref Range: 0.1-0.9 10e3/uL 1.3 (H)   Eosinophils Absolute Latest Ref Range: 0.0-0.5 10e3/uL 0.7 (H)   Basophils Absolute Latest Ref Range: 0.0-0.1 10e3/uL 0.1   lymph# Latest Ref Range: 0.9-3.3 10e3/uL 1.9    RADIOGRAPHIC STUDIES: I have personally reviewed the radiological images as listed and agreed with the findings in the report. CLINICAL DATA: Generalized fatigue and cough for a week. No appetite.  EXAM: CT CHEST, ABDOMEN AND PELVIS WITHOUT CONTRAST IMPRESSION: No evidence of active pulmonary disease. 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 Recommend annual imaging followup by CTA or MRA. This recommendation follows 2010 ACCF/AHA/AATS/ACR/ASA/SCA/SCAI/SIR/STS/SVM Guidelines for the Diagnosis and Management of Patients with Thoracic Aortic Disease. Circulation. 2010; 121: J188-C166  Prominent retroperitoneal lymphadenopathy most likely to represent lymphoma. Multiple bilateral renal lesions likely representing cysts. Bilateral inguinal hernias containing  fat. There is extensive lymphadenopathy in the retroperitoneum involving predominantly the pericaval region and extending from the abdominal hiatus down into the upper pelvis. Conglomerate mass of lymph nodes in the pericaval region measures up to about 14.3 x 12.3 x 7.4 cm. Additional enlarged lymph nodes are also demonstrated in the periaortic regions. With the large lymph  node mass displaces the aorta and partially surrounds the aorta. Inferior vena cava cannot be distinguished from the mass. This is likely to represent lymphoma although sarcoma or other retroperitoneal mass could also have this appearance  Electronically Signed  By: Lucienne Capers M.D.  On: 12/03/2014 23:15    ASSESSMENT & PLAN:  Stage IIX, Non-Hodgkin's lymphoma, low-grade B cell lymphoma, CD20 and CD10 positive, on a core biopsy of a retroperitoneal mass 12/05/2014  Hypercalcemia of malignancy-resolved ARF secondary to above Vitamin D 103 pg/ml on 12/04/2014  I discussed with the patient ongoing therapy of his lymphoma. He will be due again on August 4 and we will arrange for his treatment. He was not taking his prednisone correctly and we discussed to take it daily for 5 days. We will have to monitor his blood sugars closely given his underlying diabetes. They met with Hildred Alamin our patient navigator, received her contact information.  I have refilled his norvasc prescription.   We will continue chemotherapy treatment on August 4th.  He does have bulky disease and a question whether he will need consolidative radiation after treatment. It is however a low-grade lymphoma, therefore we will discuss this with radiation as we had towards the end of his treatment course.  Orders Placed This Encounter  Procedures  . CBC with Differential    Standing Status: Future     Number of Occurrences: 1     Standing Expiration Date: 01/08/2016  . Comprehensive metabolic panel    Standing Status: Future     Number of  Occurrences: 1     Standing Expiration Date: 01/08/2016  . Lactate dehydrogenase    Standing Status: Future     Number of Occurrences: 1     Standing Expiration Date: 01/08/2016    All questions were answered. The patient knows to call the clinic with any problems, questions or concerns.  This note was electronically signed.    This document serves as a record of services personally performed by Ancil Linsey, MD. It was created on her behalf by Arlyce Harman, a trained medical scribe. The creation of this record is based on the scribe's personal observations and the provider's statements to them. This document has been checked and approved by the attending provider.  I have reviewed the above documentation for accuracy and completeness, and I agree with the above. Molli Hazard, MD

## 2015-01-19 ENCOUNTER — Other Ambulatory Visit: Payer: Self-pay | Admitting: Oncology

## 2015-01-23 ENCOUNTER — Encounter (HOSPITAL_COMMUNITY): Payer: Medicare Other

## 2015-01-23 ENCOUNTER — Encounter (HOSPITAL_COMMUNITY): Payer: Medicare Other | Attending: Hematology & Oncology

## 2015-01-23 ENCOUNTER — Encounter (HOSPITAL_COMMUNITY): Payer: Self-pay | Admitting: Hematology & Oncology

## 2015-01-23 ENCOUNTER — Encounter (HOSPITAL_BASED_OUTPATIENT_CLINIC_OR_DEPARTMENT_OTHER): Payer: Medicare Other | Admitting: Hematology & Oncology

## 2015-01-23 VITALS — BP 140/66 | HR 87 | Temp 97.8°F | Resp 18

## 2015-01-23 VITALS — BP 149/64 | HR 96 | Temp 99.2°F | Resp 22 | Wt 213.6 lb

## 2015-01-23 DIAGNOSIS — Z5112 Encounter for antineoplastic immunotherapy: Secondary | ICD-10-CM | POA: Diagnosis not present

## 2015-01-23 DIAGNOSIS — Z5111 Encounter for antineoplastic chemotherapy: Secondary | ICD-10-CM

## 2015-01-23 DIAGNOSIS — C8293 Follicular lymphoma, unspecified, intra-abdominal lymph nodes: Secondary | ICD-10-CM

## 2015-01-23 DIAGNOSIS — N179 Acute kidney failure, unspecified: Secondary | ICD-10-CM | POA: Diagnosis not present

## 2015-01-23 DIAGNOSIS — R590 Localized enlarged lymph nodes: Secondary | ICD-10-CM | POA: Diagnosis present

## 2015-01-23 DIAGNOSIS — E1122 Type 2 diabetes mellitus with diabetic chronic kidney disease: Secondary | ICD-10-CM | POA: Diagnosis present

## 2015-01-23 DIAGNOSIS — C8290 Follicular lymphoma, unspecified, unspecified site: Secondary | ICD-10-CM

## 2015-01-23 DIAGNOSIS — E119 Type 2 diabetes mellitus without complications: Secondary | ICD-10-CM | POA: Diagnosis not present

## 2015-01-23 DIAGNOSIS — C8590 Non-Hodgkin lymphoma, unspecified, unspecified site: Secondary | ICD-10-CM | POA: Diagnosis not present

## 2015-01-23 DIAGNOSIS — N189 Chronic kidney disease, unspecified: Secondary | ICD-10-CM | POA: Insufficient documentation

## 2015-01-23 LAB — CBC WITH DIFFERENTIAL/PLATELET
Basophils Absolute: 0 10*3/uL (ref 0.0–0.1)
Basophils Relative: 1 % (ref 0–1)
Eosinophils Absolute: 0.2 10*3/uL (ref 0.0–0.7)
Eosinophils Relative: 3 % (ref 0–5)
HCT: 36 % — ABNORMAL LOW (ref 39.0–52.0)
Hemoglobin: 11.6 g/dL — ABNORMAL LOW (ref 13.0–17.0)
Lymphocytes Relative: 13 % (ref 12–46)
Lymphs Abs: 0.6 10*3/uL — ABNORMAL LOW (ref 0.7–4.0)
MCH: 29.2 pg (ref 26.0–34.0)
MCHC: 32.2 g/dL (ref 30.0–36.0)
MCV: 90.7 fL (ref 78.0–100.0)
MONO ABS: 0.4 10*3/uL (ref 0.1–1.0)
Monocytes Relative: 9 % (ref 3–12)
NEUTROS ABS: 3.5 10*3/uL (ref 1.7–7.7)
Neutrophils Relative %: 74 % (ref 43–77)
Platelets: 264 10*3/uL (ref 150–400)
RBC: 3.97 MIL/uL — AB (ref 4.22–5.81)
RDW: 13 % (ref 11.5–15.5)
WBC: 4.7 10*3/uL (ref 4.0–10.5)

## 2015-01-23 LAB — COMPREHENSIVE METABOLIC PANEL
ALBUMIN: 3.6 g/dL (ref 3.5–5.0)
ALK PHOS: 74 U/L (ref 38–126)
ALT: 10 U/L — AB (ref 17–63)
ANION GAP: 9 (ref 5–15)
AST: 13 U/L — ABNORMAL LOW (ref 15–41)
BUN: 18 mg/dL (ref 6–20)
CO2: 33 mmol/L — ABNORMAL HIGH (ref 22–32)
Calcium: 8.7 mg/dL — ABNORMAL LOW (ref 8.9–10.3)
Chloride: 91 mmol/L — ABNORMAL LOW (ref 101–111)
Creatinine, Ser: 2.07 mg/dL — ABNORMAL HIGH (ref 0.61–1.24)
GFR calc non Af Amer: 31 mL/min — ABNORMAL LOW (ref >60)
GFR, EST AFRICAN AMERICAN: 36 mL/min — AB (ref >60)
Glucose, Bld: 388 mg/dL — ABNORMAL HIGH (ref 65–99)
Potassium: 4.8 mmol/L (ref 3.5–5.1)
SODIUM: 133 mmol/L — AB (ref 135–145)
Total Bilirubin: 0.5 mg/dL (ref 0.3–1.2)
Total Protein: 6.8 g/dL (ref 6.5–8.1)

## 2015-01-23 LAB — LACTATE DEHYDROGENASE: LDH: 161 U/L (ref 98–192)

## 2015-01-23 MED ORDER — INSULIN LISPRO 100 UNIT/ML ~~LOC~~ SOLN: SUBCUTANEOUS | Status: DC

## 2015-01-23 MED ORDER — ACETAMINOPHEN 325 MG PO TABS
ORAL_TABLET | ORAL | Status: AC
  Filled 2015-01-23: qty 2

## 2015-01-23 MED ORDER — DIPHENHYDRAMINE HCL 25 MG PO CAPS
ORAL_CAPSULE | ORAL | Status: AC
Start: 2015-01-23 — End: 2015-01-23
  Filled 2015-01-23: qty 2

## 2015-01-23 MED ORDER — RITUXIMAB CHEMO INJECTION 500 MG/50ML
375.0000 mg/m2 | Freq: Once | INTRAVENOUS | Status: AC
  Administered 2015-01-23: 800 mg via INTRAVENOUS
  Filled 2015-01-23: qty 80

## 2015-01-23 MED ORDER — SODIUM CHLORIDE 0.9 % IV SOLN
Freq: Once | INTRAVENOUS | Status: AC
  Administered 2015-01-23: 11:00:00 via INTRAVENOUS

## 2015-01-23 MED ORDER — INSULIN ASPART 100 UNIT/ML ~~LOC~~ SOLN
12.0000 [IU] | Freq: Once | SUBCUTANEOUS | Status: AC
  Administered 2015-01-23: 12 [IU] via SUBCUTANEOUS
  Filled 2015-01-23: qty 0.12

## 2015-01-23 MED ORDER — HEPARIN SOD (PORK) LOCK FLUSH 100 UNIT/ML IV SOLN: 500.0000 [IU] | Freq: Once | INTRAVENOUS | Status: DC | PRN

## 2015-01-23 MED ORDER — SODIUM CHLORIDE 0.9 % IJ SOLN: 10.0000 mL | INTRAMUSCULAR | Status: DC | PRN

## 2015-01-23 MED ORDER — SODIUM CHLORIDE 0.9 % IV SOLN
Freq: Once | INTRAVENOUS | Status: AC
  Administered 2015-01-23: 14:00:00 via INTRAVENOUS
  Filled 2015-01-23: qty 8

## 2015-01-23 MED ORDER — ACETAMINOPHEN 325 MG PO TABS
650.0000 mg | ORAL_TABLET | Freq: Once | ORAL | Status: AC
  Administered 2015-01-23: 650 mg via ORAL

## 2015-01-23 MED ORDER — SODIUM CHLORIDE 0.9 % IV SOLN
400.0000 mg/m2 | Freq: Once | INTRAVENOUS | Status: AC
  Administered 2015-01-23: 880 mg via INTRAVENOUS
  Filled 2015-01-23: qty 44

## 2015-01-23 MED ORDER — DIPHENHYDRAMINE HCL 25 MG PO CAPS
50.0000 mg | ORAL_CAPSULE | Freq: Once | ORAL | Status: AC
  Administered 2015-01-23: 50 mg via ORAL

## 2015-01-23 NOTE — Patient Instructions (Addendum)
[place] at [facility] Discharge Instructions  RECOMMENDATIONS MADE BY THE CONSULTANT AND ANY TEST RESULTS WILL BE SENT TO YOUR REFERRING PHYSICIAN.  Exam and discussion by Dr. Whitney Muse. Will continue in therapy Report fevers, uncontrolled nausea, vomiting, etc.  Follow-up in 3 weeks with chemo and office visit.   Humalog Insulin Orders: Check blood sugars three times a day and administer Humalog insulin as ordered below:  151-200   = 2 units 201-250   = 4 units 251-300   = 6 units 301 or greater   = 8 units and call doctor  Thank you for choosing [place] at [facility] to provide your oncology and hematology care.  To afford each patient quality time with our provider, please arrive at least 15 minutes before your scheduled appointment time.    You need to re-schedule your appointment should you arrive 10 or more minutes late.  We strive to give you quality time with our providers, and arriving late affects you and other patients whose appointments are after yours.  Also, if you no show three or more times for appointments you may be dismissed from the clinic at the providers discretion.     Again, thank you for choosing [facility].  Our hope is that these requests will decrease the amount of time that you wait before being seen by our physicians.       _____________________________________________________________  Should you have questions after your visit to [facility], please contact our office at [phone] between the hours of 8:30 a.m. and 4:30 p.m.  Voicemails left after 4:30 p.m. will not be returned until the following business day.  For prescription refill requests, have your pharmacy contact our office.    Insulin Lispro injection What is this medicine? INSULIN LISPRO (IN su lin LYE sproe) is a human-made form of insulin. This drug lowers the amount of sugar in your blood. This medicine is a  rapid-acting insulin that starts working faster than regular insulin. It will not work as long as regular insulin. This medicine may be used for other purposes; ask your health care provider or pharmacist if you have questions. COMMON BRAND NAME(S): Humalog, Humalog KwikPen What should I tell my health care provider before I take this medicine? They need to know if you have any of these conditions: -episodes of hypoglycemia -kidney disease -liver disease -an unusual or allergic reaction to insulin, metacresol, other medicines, foods, dyes, or preservatives -pregnant or trying to get pregnant -breast-feeding How should I use this medicine? This medicine is for injection under the skin or infusion into a vein. This medicine may be given by health care professional in a hospital or clinic setting. It is important to follow the directions given to you by your health care professional or doctor. You should inject this medicine within 15 minutes before or after your meal. Have food ready before injection. Do not delay eating. You will be taught how to use this medicine and how to adjust doses for activities and illness. Do not use more insulin than prescribed. Do not use more or less often than prescribed. Always check the appearance of your insulin before using it. This medicine should be clear and colorless like water. Do not use it if it is cloudy, thickened, colored, or has solid particles in it. It is important that you put your used needles and syringes in a special sharps container. Do not put them  in a trash can. If you do not have a sharps container, call your pharmacist or healthcare provider to get one. Talk to your pediatrician regarding the use of this medicine in children. Special care may be needed. Overdosage: If you think you have taken too much of this medicine contact a poison control center or emergency room at once. NOTE: This medicine is only for you. Do not share this medicine with  others. What if I miss a dose? It is important not to miss a dose. Your health care professional or doctor should discuss a plan for missed doses with you. If you do miss a dose, follow their plan. Do not take double doses. What may interact with this medicine? -other medicines for diabetes Many medications may cause an increase or decrease in blood sugar, these include: -alcohol containing beverages -aspirin and aspirin-like drugs -chloramphenicol -chromium -diuretics -male hormones, like estrogens or progestins and birth control pills -heart medicines -isoniazid -male hormones or anabolic steroids -medicines for weight loss -medicines for allergies, asthma, cold, or cough -medicines for mental problems -medicines called MAO Inhibitors like Nardil, Parnate, Marplan, Eldepryl -niacin -NSAIDs, medicines for pain and inflammation, like ibuprofen or naproxen -pentamidine -phenytoin -probenecid -quinolone antibiotics like ciprofloxacin, levofloxacin, ofloxacin -some herbal dietary supplements -steroid medicines like prednisone or cortisone -thyroid medicine Some medications can hide the warning symptoms of low blood sugar. You may need to monitor your blood sugar more closely if you are taking one of these medications. These include: -beta-blockers such as atenolol, metoprolol, propranolol -clonidine -guanethidine -reserpine This list may not describe all possible interactions. Give your health care provider a list of all the medicines, herbs, non-prescription drugs, or dietary supplements you use. Also tell them if you smoke, drink alcohol, or use illegal drugs. Some items may interact with your medicine. What should I watch for while using this medicine? Visit your health care professional or doctor for regular checks on your progress. A test called the HbA1C (A1C) will be monitored. This is a simple blood test. It measures your blood sugar control over the last 2 to 3 months. You  will receive this test every 3 to 6 months. Learn how to check your blood sugar. Learn the symptoms of low and high blood sugar and how to manage them. Always carry a quick-source of sugar with you in case you have symptoms of low blood sugar. Examples include hard sugar candy or glucose tablets. Make sure others know that you can choke if you eat or drink when you develop serious symptoms of low blood sugar, such as seizures or unconsciousness. They must get medical help at once. Tell your doctor or health care professional if you have high blood sugar. You might need to change the dose of your medicine. If you are sick or exercising more than usual, you might need to change the dose of your medicine. Do not skip meals. Ask your doctor or health care professional if you should avoid alcohol. Many nonprescription cough and cold products contain sugar or alcohol. These can affect blood sugar. Make sure that you have the right kind of syringe for the type of insulin you use. Try not to change the brand and type of insulin or syringe unless your health care professional or doctor tells you to. Switching insulin brand or type can cause dangerously high or low blood sugar. Always keep an extra supply of insulin, syringes, and needles on hand. Use a syringe one time only. Throw away syringe and needle  in a closed container to prevent accidental needle sticks. Insulin pens and cartridges should never be shared. Even if the needle is changed, sharing may result in passing of viruses like hepatitis or HIV. Wear a medical ID bracelet or chain, and carry a card that describes your disease and details of your medicine and dosage times. What side effects may I notice from receiving this medicine? Side effects that you should report to your health care professional or doctor as soon as possible: -allergic reactions like skin rash, itching or hives, swelling of the face, lips, or tongue -breathing problems -signs and  symptoms of high blood sugar such as dizziness, dry mouth, dry skin, fruity breath, nausea, stomach pain, increased hunger or thirst, increased urination -signs and symptoms of low blood sugar such as feeling anxious, confusion, dizziness, increased hunger, unusually weak or tired, sweating, shakiness, cold, irritable, headache, blurred vision, fast heartbeat, loss of consciousness Side effects that usually do not require medical attention (report to your health care professional or doctor if they continue or are bothersome): -increase or decrease in fatty tissue under the skin due to overuse of a particular injection site -itching, burning, swelling, or rash at site where injected This list may not describe all possible side effects. Call your doctor for medical advice about side effects. You may report side effects to FDA at 1-800-FDA-1088. Where should I keep my medicine? Keep out of the reach of children. Store unopened insulin vials in a refrigerator between 2 and 8 degrees C (36 and 46 degrees F). Do not freeze or use if the insulin has been frozen. Opened vials (vials currently in use) may be stored in the refrigerator or at room temperature, at approximately 30 degrees C (86 degrees F) or cooler. Keeping your insulin at room temperature decreases the amount of pain during injection. Once opened, your insulin can be used for 28 days. After 28 days, the vial of insulin should be thrown away. Store unopened cartridges or disposable pens in a refrigerator between 2 and 8 degrees C (36 and 46 degrees F.) Do not freeze or use if the insulin has been frozen. Once opened, the disposable pens and cartridges that are inserted into pens should be kept at room temperature, approximately 30 degrees C (80 degrees F) or cooler. Do not store in the refrigerator. Once opened, the insulin can be used for 28 days. After 28 days, the cartridge or disposable pen should be thrown away. Protect from light and excessive  heat. Throw away any unused medicine after the expiration date or after the specified time for room temperature storage has passed. NOTE: This sheet is a summary. It may not cover all possible information. If you have questions about this medicine, talk to your doctor, pharmacist, or health care provider.  2015, Elsevier/Gold Standard. (2013-08-16 10:02:00)

## 2015-01-23 NOTE — Progress Notes (Signed)
1100 Dr Whitney Muse notified of Creatinine 2.07 ok to treat today. Blood glucose 388 orders received.   Billy Gray Tolerated chemotherapy well today Discharged ambulatory

## 2015-01-23 NOTE — Patient Instructions (Signed)
[facility] Discharge Instructions for Patients Receiving Chemotherapy  Today you received the following chemotherapy agents Rituxan and cytoxan Follow up as scheduled Please call the clinic if you have any questions or concerns  To help prevent nausea and vomiting after your treatment, we encourage you to take your nausea medication    If you develop nausea and vomiting, or diarrhea that is not controlled by your medication, call the clinic.  The clinic phone number is [phone]. Office hours are Monday-Friday 8:30am-5:00pm.  BELOW ARE SYMPTOMS THAT SHOULD BE REPORTED IMMEDIATELY:  *FEVER GREATER THAN 101.0 F  *CHILLS WITH OR WITHOUT FEVER  NAUSEA AND VOMITING THAT IS NOT CONTROLLED WITH YOUR NAUSEA MEDICATION  *UNUSUAL SHORTNESS OF BREATH  *UNUSUAL BRUISING OR BLEEDING  TENDERNESS IN MOUTH AND THROAT WITH OR WITHOUT PRESENCE OF ULCERS  *URINARY PROBLEMS  *BOWEL PROBLEMS  UNUSUAL RASH Items with * indicate a potential emergency and should be followed up as soon as possible. If you have an emergency after office hours please contact your primary care physician or go to the nearest emergency department.  Please call the clinic during office hours if you have any questions or concerns.   You may also contact the Patient Navigator at [phone] should you have any questions or need assistance in obtaining follow up care. _____________________________________________________________________ Have you asked about our STAR program?    STAR stands for Survivorship Training and Rehabilitation, and this is a nationally recognized cancer care program that focuses on survivorship and rehabilitation.  Cancer and cancer treatments may cause problems, such as, pain, making you feel tired and keeping you from doing the things that you need or want to do. Cancer rehabilitation can help. Our goal is to reduce these troubling effects and help you have the best quality of  life possible.  You may receive a survey from a nurse that asks questions about your current state of health.  Based on the survey results, all eligible patients will be referred to the [facility] program for an evaluation so we can better serve you! A frequently asked questions sheet is available upon request.

## 2015-01-23 NOTE — Progress Notes (Signed)
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 male is here for follow-up of his NHL.  He is due for C#3 of chemotherapy today. He has his prednisone. He understands he is to take it daily for 5 days.  He denies any abdominal pain. He says his bowels are fine. His appetite is improved. He has no headaches, no confusion, no other major complaints. He is wearing oxygen. He is here today with his wife.  MEDICAL HISTORY:  Past Medical History  Diagnosis Date  . COPD (chronic obstructive pulmonary disease)   . Hypertension   . Diabetes mellitus without complication   . Chronic kidney disease     SURGICAL HISTORY: Past Surgical History  Procedure Laterality Date  . Replacement total knee      right  . Appendectomy      SOCIAL HISTORY: History   Social History  . Marital Status: Married    Spouse Name: N/A  . Number of Children: N/A  . Years of Education: N/A   Occupational History  . Not on file.   Social History Main Topics  . Smoking status: Former Smoker    Quit date: 09/07/2001  . Smokeless tobacco: Not on file  . Alcohol Use: No  . Drug Use: No  . Sexual Activity: Not on file   Other Topics Concern  . Not on file   Social History Narrative    FAMILY HISTORY: Family History  Problem Relation Age of Onset  .  Diabetes Mother   . Diabetes Father   . Cancer Brother   . Diabetes Brother    has no family status information on file.   ALLERGIES:  has No Known Allergies.  MEDICATIONS:  Current Outpatient Prescriptions  Medication Sig Dispense Refill  . acetaminophen (TYLENOL) 500 MG tablet Take 500 mg by mouth every 6 (six) hours as needed for moderate pain.    Marland Kitchen albuterol (PROVENTIL HFA;VENTOLIN HFA) 108 (90 BASE) MCG/ACT inhaler Inhale 2 puffs into the lungs every 6 (six) hours as needed for wheezing or shortness of breath.    . allopurinol (ZYLOPRIM) 100 MG tablet Take 1 tablet (100 mg total) by mouth daily. (Patient taking differently: Take 150 mg by mouth daily. ) 30 tablet 0  . amLODipine (NORVASC) 5 MG tablet Take 1 tablet (5 mg total) by mouth daily. 30 tablet 4  . budesonide-formoterol (SYMBICORT) 160-4.5 MCG/ACT inhaler Inhale 2 puffs into the lungs every evening.    Marland Kitchen HYDROcodone-acetaminophen (NORCO/VICODIN) 5-325 MG per tablet     . insulin glargine (LANTUS) 100 UNIT/ML injection Inject 0.15 mLs (15 Units total) into the skin at bedtime. (Patient taking differently: Inject 30 Units into the skin at bedtime. ) 10 mL 0  . pravastatin (PRAVACHOL) 40 MG tablet Take 40 mg by mouth every evening.    . predniSONE (DELTASONE) 20 MG tablet Take 3 tablets (60 mg total) by mouth daily with breakfast. Take for 5  days 15 tablet 4  . traZODone (DESYREL) 50 MG tablet Take 50 mg by mouth at bedtime.    Marland Kitchen zolpidem (AMBIEN) 10 MG tablet Take 10 mg by mouth at bedtime.     No current facility-administered medications for this visit.    Review of Systems  All other systems reviewed and are negative.  14 point ROS was done and is otherwise as detailed above or in HPI   PHYSICAL EXAMINATION: ECOG PERFORMANCE STATUS: 1 - Symptomatic but completely ambulatory  There were no vitals filed for this visit. There were no vitals filed for this visit.   Physical Exam  Constitutional: He is oriented to  person, place, and time and well-developed, well-nourished, and in no distress.  Wearing O2  HENT:  Head: Normocephalic and atraumatic.  Nose: Nose normal.  Mouth/Throat: Oropharynx is clear and moist. No oropharyngeal exudate.  Eyes: Conjunctivae and EOM are normal. Pupils are equal, round, and reactive to light. Right eye exhibits no discharge. Left eye exhibits no discharge. No scleral icterus.  Neck: Normal range of motion. Neck supple. No tracheal deviation present. No thyromegaly present.  Cardiovascular: Normal rate, regular rhythm and normal heart sounds.  Exam reveals no gallop and no friction rub.   No murmur heard. Pulmonary/Chest: Effort normal and breath sounds normal. He has no wheezes. He has no rales.  Abdominal: Soft. Bowel sounds are normal. He exhibits no distension and no mass. There is no tenderness. There is no rebound and no guarding.  obese  Musculoskeletal: Normal range of motion. He exhibits no edema.  Lymphadenopathy:    He has no cervical adenopathy.  Neurological: He is alert and oriented to person, place, and time. He has normal reflexes. No cranial nerve deficit. Gait normal. Coordination normal.  Skin: Skin is warm and dry. No rash noted.  Psychiatric: Mood, memory, affect and judgment normal.  Nursing note and vitals reviewed.    LABORATORY DATA:  I have reviewed the data as listed Lab Results  Component Value Date   WBC 8.3 01/02/2015   HGB 12.3* 01/02/2015   HCT 36.8* 01/02/2015   MCV 89.8 01/02/2015   PLT 258 01/02/2015   CMP     Component Value Date/Time   NA 133* 01/23/2015 0830   NA 140 01/02/2015 0959   K 4.8 01/23/2015 0830   K 4.3 01/02/2015 0959   CL 91* 01/23/2015 0830   CO2 33* 01/23/2015 0830   CO2 38* 01/02/2015 0959   GLUCOSE 388* 01/23/2015 0830   GLUCOSE 219* 01/02/2015 0959   BUN 18 01/23/2015 0830   BUN 11.6 01/02/2015 0959   CREATININE 2.07* 01/23/2015 0830   CREATININE 2.5* 01/02/2015 0959   CALCIUM 8.7* 01/23/2015  0830   CALCIUM 10.1 01/02/2015 0959   CALCIUM 15.0* 12/03/2014 2104   PROT 6.8 01/23/2015 0830   PROT 6.0* 01/02/2015 0959   ALBUMIN 3.6 01/23/2015 0830   ALBUMIN 2.9* 01/02/2015 0959   AST 13* 01/23/2015 0830   AST 11 01/02/2015 0959   ALT 10* 01/23/2015 0830   ALT 9 01/02/2015 0959   ALKPHOS 74 01/23/2015 0830   ALKPHOS 104 01/02/2015 0959   BILITOT 0.5 01/23/2015 0830   BILITOT 0.28 01/02/2015 0959   GFRNONAA 31* 01/23/2015 0830   GFRAA 36* 01/23/2015 0830     ASSESSMENT & PLAN:  Stage IIX Non-Hodgkin's lymphoma, low-grade B cell lymphoma, CD20 and CD10 positive, on a core biopsy of a retroperitoneal mass 12/05/2014 Hypercalcemia of malignancy-resolved, calcium of >15 mg/dl on 12/03/2014  ARF secondary to above Vitamin D 103 pg/ml on 12/04/2014 Pamidronate on 12/20/2014 Hyperglycemia secondary to prednisone/ known history diabetes   We will give the patient NovoLog to bring down his blood sugar today. He is comfortable with administration of insulin and we have given him a sliding scale with NovoLog to use during the time he is on prednisone.  He will be set up for repeat imaging studies without contrast prior to his next follow-up and cycle #4 of chemotherapy. I explained to the patient and his wife these are for restaging.  If he has difficulties problems or concerns after this cycle of chemotherapy they were advised to call. I reviewed fever/chemotherapy.  Orders Placed This Encounter  Procedures  . CT Abdomen Pelvis Wo Contrast    Standing Status: Future     Number of Occurrences:      Standing Expiration Date: 01/24/2016    Order Specific Question:  Reason for Exam (SYMPTOM  OR DIAGNOSIS REQUIRED)    Answer:  restaging NHL    Order Specific Question:  Preferred imaging location?    Answer:  [facility]  . CT Chest Wo Contrast    Standing Status: Future     Number of Occurrences:      Standing Expiration Date: 01/24/2016    Order Specific Question:  Reason for  Exam (SYMPTOM  OR DIAGNOSIS REQUIRED)    Answer:  restaging NHL    Order Specific Question:  Preferred imaging location?    Answer:  [facility]     All questions were answered. The patient knows to call the clinic with any problems, questions or concerns. This note was electronically signed.   This document serves as a record of services personally performed by Ancil Linsey, MD. It was created on her behalf by Toni Amend, a trained medical scribe. The creation of this record is based on the scribe's personal observations and the provider's statements to them. This document has been checked and approved by the attending provider.  I have reviewed the above documentation for accuracy and completeness, and I agree with the above.   Molli Hazard, MD

## 2015-02-09 ENCOUNTER — Other Ambulatory Visit: Payer: Self-pay | Admitting: Oncology

## 2015-02-10 ENCOUNTER — Ambulatory Visit (HOSPITAL_COMMUNITY)
Admission: RE | Admit: 2015-02-10 | Discharge: 2015-02-10 | Disposition: A | Discharge status: Home / Self Care | Payer: Medicare Other | Source: Ambulatory Visit | Attending: Hematology & Oncology | Admitting: Hematology & Oncology

## 2015-02-10 DIAGNOSIS — C8290 Follicular lymphoma, unspecified, unspecified site: Secondary | ICD-10-CM

## 2015-02-10 DIAGNOSIS — C8293 Follicular lymphoma, unspecified, intra-abdominal lymph nodes: Secondary | ICD-10-CM | POA: Diagnosis not present

## 2015-02-10 DIAGNOSIS — Z79899 Other long term (current) drug therapy: Secondary | ICD-10-CM | POA: Diagnosis not present

## 2015-02-10 DIAGNOSIS — Z08 Encounter for follow-up examination after completed treatment for malignant neoplasm: Secondary | ICD-10-CM | POA: Diagnosis present

## 2015-02-10 DIAGNOSIS — I2510 Atherosclerotic heart disease of native coronary artery without angina pectoris: Secondary | ICD-10-CM | POA: Insufficient documentation

## 2015-02-12 ENCOUNTER — Encounter (HOSPITAL_BASED_OUTPATIENT_CLINIC_OR_DEPARTMENT_OTHER): Payer: Medicare Other | Admitting: Hematology & Oncology

## 2015-02-12 ENCOUNTER — Encounter (HOSPITAL_BASED_OUTPATIENT_CLINIC_OR_DEPARTMENT_OTHER): Payer: Medicare Other

## 2015-02-12 VITALS — BP 137/69 | HR 91 | Temp 97.6°F | Resp 20 | Wt 206.4 lb

## 2015-02-12 VITALS — BP 113/65 | HR 87 | Temp 97.6°F | Resp 20

## 2015-02-12 DIAGNOSIS — C8293 Follicular lymphoma, unspecified, intra-abdominal lymph nodes: Secondary | ICD-10-CM | POA: Diagnosis not present

## 2015-02-12 DIAGNOSIS — Z5112 Encounter for antineoplastic immunotherapy: Secondary | ICD-10-CM | POA: Diagnosis not present

## 2015-02-12 DIAGNOSIS — E1165 Type 2 diabetes mellitus with hyperglycemia: Secondary | ICD-10-CM | POA: Diagnosis not present

## 2015-02-12 DIAGNOSIS — C8590 Non-Hodgkin lymphoma, unspecified, unspecified site: Secondary | ICD-10-CM | POA: Diagnosis not present

## 2015-02-12 DIAGNOSIS — R590 Localized enlarged lymph nodes: Secondary | ICD-10-CM

## 2015-02-12 DIAGNOSIS — C8290 Follicular lymphoma, unspecified, unspecified site: Secondary | ICD-10-CM

## 2015-02-12 DIAGNOSIS — Z5111 Encounter for antineoplastic chemotherapy: Secondary | ICD-10-CM | POA: Diagnosis not present

## 2015-02-12 LAB — CBC WITH DIFFERENTIAL/PLATELET
Basophils Absolute: 0 10*3/uL (ref 0.0–0.1)
Basophils Relative: 1 % (ref 0–1)
EOS PCT: 7 % — AB (ref 0–5)
Eosinophils Absolute: 0.3 10*3/uL (ref 0.0–0.7)
HCT: 36.1 % — ABNORMAL LOW (ref 39.0–52.0)
Hemoglobin: 11.8 g/dL — ABNORMAL LOW (ref 13.0–17.0)
LYMPHS PCT: 41 % (ref 12–46)
Lymphs Abs: 1.6 10*3/uL (ref 0.7–4.0)
MCH: 29.1 pg (ref 26.0–34.0)
MCHC: 32.7 g/dL (ref 30.0–36.0)
MCV: 88.9 fL (ref 78.0–100.0)
MONO ABS: 0.4 10*3/uL (ref 0.1–1.0)
Monocytes Relative: 10 % (ref 3–12)
Neutro Abs: 1.6 10*3/uL — ABNORMAL LOW (ref 1.7–7.7)
Neutrophils Relative %: 42 % — ABNORMAL LOW (ref 43–77)
PLATELETS: 188 10*3/uL (ref 150–400)
RBC: 4.06 MIL/uL — ABNORMAL LOW (ref 4.22–5.81)
RDW: 13 % (ref 11.5–15.5)
WBC: 4 10*3/uL (ref 4.0–10.5)

## 2015-02-12 LAB — COMPREHENSIVE METABOLIC PANEL
ALT: 11 U/L — ABNORMAL LOW (ref 17–63)
ANION GAP: 6 (ref 5–15)
AST: 14 U/L — AB (ref 15–41)
Albumin: 3.1 g/dL — ABNORMAL LOW (ref 3.5–5.0)
Alkaline Phosphatase: 57 U/L (ref 38–126)
BUN: 13 mg/dL (ref 6–20)
CHLORIDE: 92 mmol/L — AB (ref 101–111)
CO2: 39 mmol/L — ABNORMAL HIGH (ref 22–32)
Calcium: 8.5 mg/dL — ABNORMAL LOW (ref 8.9–10.3)
Creatinine, Ser: 1.95 mg/dL — ABNORMAL HIGH (ref 0.61–1.24)
GFR calc Af Amer: 39 mL/min — ABNORMAL LOW (ref >60)
GFR, EST NON AFRICAN AMERICAN: 34 mL/min — AB (ref >60)
Glucose, Bld: 270 mg/dL — ABNORMAL HIGH (ref 65–99)
POTASSIUM: 4.2 mmol/L (ref 3.5–5.1)
Sodium: 137 mmol/L (ref 135–145)
Total Bilirubin: 0.4 mg/dL (ref 0.3–1.2)
Total Protein: 6.1 g/dL — ABNORMAL LOW (ref 6.5–8.1)

## 2015-02-12 MED ORDER — SODIUM CHLORIDE 0.9 % IV SOLN
400.0000 mg/m2 | Freq: Once | INTRAVENOUS | Status: AC
  Administered 2015-02-12: 880 mg via INTRAVENOUS
  Filled 2015-02-12: qty 44

## 2015-02-12 MED ORDER — ACETAMINOPHEN 325 MG PO TABS
650.0000 mg | ORAL_TABLET | Freq: Once | ORAL | Status: AC
  Administered 2015-02-12: 650 mg via ORAL
  Filled 2015-02-12: qty 2

## 2015-02-12 MED ORDER — SODIUM CHLORIDE 0.9 % IV SOLN
375.0000 mg/m2 | Freq: Once | INTRAVENOUS | Status: AC
  Administered 2015-02-12: 800 mg via INTRAVENOUS
  Filled 2015-02-12: qty 70

## 2015-02-12 MED ORDER — HEPARIN SOD (PORK) LOCK FLUSH 100 UNIT/ML IV SOLN
500.0000 [IU] | Freq: Once | INTRAVENOUS | Status: DC | PRN
Start: 2015-02-12 — End: 2015-02-12

## 2015-02-12 MED ORDER — DIPHENHYDRAMINE HCL 25 MG PO CAPS
50.0000 mg | ORAL_CAPSULE | Freq: Once | ORAL | Status: AC
  Administered 2015-02-12: 50 mg via ORAL
  Filled 2015-02-12: qty 2

## 2015-02-12 MED ORDER — SODIUM CHLORIDE 0.9 % IJ SOLN: 10.0000 mL | INTRAMUSCULAR | Status: DC | PRN

## 2015-02-12 MED ORDER — SODIUM CHLORIDE 0.9 % IV SOLN
Freq: Once | INTRAVENOUS | Status: AC
  Administered 2015-02-12: 10:00:00 via INTRAVENOUS
  Filled 2015-02-12: qty 8

## 2015-02-12 MED ORDER — PEGFILGRASTIM 6 MG/0.6ML ~~LOC~~ PSKT
6.0000 mg | PREFILLED_SYRINGE | Freq: Once | SUBCUTANEOUS | Status: AC
  Administered 2015-02-12: 6 mg via SUBCUTANEOUS
  Filled 2015-02-12: qty 0.6

## 2015-02-12 MED ORDER — SODIUM CHLORIDE 0.9 % IV SOLN
Freq: Once | INTRAVENOUS | Status: AC
  Administered 2015-02-12: 10:00:00 via INTRAVENOUS

## 2015-02-12 NOTE — Progress Notes (Signed)
Dr Whitney Muse notified of Billy Gray, glucose, and creatine  OK to treat will apply OBI neulasta today   .Marland KitchenShanon Brow Durhampresents today for neulasta OBI placement per MD orders. OBI device filled per protocol and placed on left Upper Arm. Needle/catheter placement noted prior to patient leaving. Tolerated without incident and aware of injection to be delivered in  27 hours. [place] chemotherapy well today Discharged via wheelchair

## 2015-02-12 NOTE — Progress Notes (Signed)
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 male is here for follow-up of his NHL.  He is due for C#4 of chemotherapy today. He has his prednisone. He understands he is to take it daily for 5 days.  The patient presents today for treatment.  His recent scans were also discussed.  He has complaints of having gout which has now subsided.  He also states that his blood sugar levels have been increased while on the Prednisone however these were decreased with his Insulin, his levels have been ranging from 70-80.    He states that his breathing has worsened since he was last hospitalized.  The patient "cannot even walk to the bathroom".  His Pulmonologist, Dr. Gwenette Greet in the [place], encouraged him to stay on his Oxygen tank 24/7.  He does not feel chemotherapy has made this worse. He emphasizes that it began prior to his hospital stay in June.  The patient denies any problems oncologically.  MEDICAL HISTORY:  Past Medical History  Diagnosis Date  . COPD (chronic obstructive pulmonary disease)   . Hypertension   . Diabetes mellitus without complication   . Chronic kidney disease     SURGICAL HISTORY: Past Surgical History  Procedure Laterality Date  . Replacement total knee      right  . Appendectomy      SOCIAL HISTORY: Social History     Social History  . Marital Status: Married    Spouse Name: N/A  . Number of Children: N/A  . Years of Education: N/A   Occupational History  . Not on file.   Social History Main Topics  . Smoking status: Former Smoker    Quit date: 09/07/2001  . Smokeless tobacco: Not on file  . Alcohol Use: No  . Drug Use: No  . Sexual Activity: Not on file   Other Topics Concern  . Not on file   Social History Narrative    FAMILY HISTORY: Family History  Problem Relation Age of Onset  . Diabetes Mother   . Diabetes Father   . Cancer Brother   . Diabetes Brother    has no family status information on file.   ALLERGIES:  has No Known Allergies.  MEDICATIONS:  Current Outpatient Prescriptions  Medication Sig Dispense Refill  . acetaminophen (TYLENOL) 500 MG tablet Take 500 mg by mouth every 6 (six) hours as needed for moderate pain.    Marland Kitchen albuterol (PROVENTIL HFA;VENTOLIN HFA) 108 (90 BASE) MCG/ACT inhaler Inhale 2 puffs into the lungs every 6 (six) hours as needed for wheezing or shortness of breath.    . allopurinol (ZYLOPRIM) 100 MG tablet Take 1 tablet (100 mg total) by mouth daily. (Patient taking differently: Take 150 mg by mouth daily. ) 30 tablet 0  . amLODipine (NORVASC) 5 MG tablet Take 1 tablet (5 mg total) by mouth daily. 30 tablet 4  . budesonide-formoterol (SYMBICORT) 160-4.5 MCG/ACT inhaler Inhale 2  puffs into the lungs 2 (two) times daily.     Marland Kitchen gabapentin (NEURONTIN) 800 MG tablet Take 400 mg by mouth at bedtime. Take 1/2 tablet (400mg ) every HS.    Marland Kitchen HYDROcodone-acetaminophen (NORCO/VICODIN) 5-325 MG per tablet     . insulin glargine (LANTUS) 100 UNIT/ML injection Inject 0.15 mLs (15 Units total) into the skin at bedtime. (Patient taking differently: Inject 35 Units into the skin at bedtime. ) 10 mL 0  . insulin lispro (HUMALOG) 100 UNIT/ML injection 151-200 = 2 units, 201-250 = 4 units, 251-300 = 6 units, 301 or greater = 8 units & call MD 10 mL 1  . pravastatin  (PRAVACHOL) 40 MG tablet Take 40 mg by mouth every evening.    . predniSONE (DELTASONE) 20 MG tablet Take 3 tablets (60 mg total) by mouth daily with breakfast. Take for 5 days 15 tablet 4  . tiotropium (SPIRIVA) 18 MCG inhalation capsule Place 18 mcg into inhaler and inhale 2 (two) times daily. 2 puffs 2 times daily    . traZODone (DESYREL) 50 MG tablet Take 50 mg by mouth at bedtime.    Marland Kitchen zolpidem (AMBIEN) 10 MG tablet Take 10 mg by mouth at bedtime.     No current facility-administered medications for this visit.    Review of Systems  All other systems reviewed and are negative.  14 point ROS was done and is otherwise as detailed above or in HPI   PHYSICAL EXAMINATION: ECOG PERFORMANCE STATUS: 1 - Symptomatic but completely ambulatory  Filed Vitals:   02/12/15 0820  BP: 137/69  Pulse: 91  Temp: 97.6 F (36.4 C)  Resp: 20   Filed Weights   02/12/15 0820  Weight: 206 lb 6.4 oz (93.622 kg)     Physical Exam  Constitutional: He is oriented to person, place, and time and well-developed, well-nourished, and in no distress.  Wearing O2  HENT:  Head: Normocephalic and atraumatic.  Nose: Nose normal.  Mouth/Throat: Oropharynx is clear and moist. No oropharyngeal exudate.  Eyes: Conjunctivae and EOM are normal. Pupils are equal, round, and reactive to light. Right eye exhibits no discharge. Left eye exhibits no discharge. No scleral icterus.  Neck: Normal range of motion. Neck supple. No tracheal deviation present. No thyromegaly present.  Cardiovascular: Normal rate, regular rhythm and normal heart sounds.  Exam reveals no gallop and no friction rub.   No murmur heard. Pulmonary/Chest: Effort normal and breath sounds normal. He has no wheezes. He has no rales.  Abdominal: Soft. Bowel sounds are normal. He exhibits no distension and no mass. There is no tenderness. There is no rebound and no guarding.  obese  Musculoskeletal: Normal range of motion. He exhibits no edema.    Lymphadenopathy:    He has no cervical adenopathy.  Neurological: He is alert and oriented to person, place, and time. He has normal reflexes. No cranial nerve deficit. Gait normal. Coordination normal.  Skin: Skin is warm and dry. No rash noted.  Psychiatric: Mood, memory, affect and judgment normal.  Nursing note and vitals reviewed.    LABORATORY DATA:  I have reviewed the data as listed Lab Results  Component Value Date   WBC 7.6 03/12/2015   HGB 12.9* 03/12/2015   HCT 39.2 03/12/2015   MCV 89.3 03/12/2015   PLT 297 03/12/2015   CMP     Component Value Date/Time   NA 139 03/12/2015 0930   NA 140 01/02/2015 0959   K 4.2 03/12/2015 0930   K  4.3 01/02/2015 0959   CL 96* 03/12/2015 0930   CO2 39* 03/12/2015 0930   CO2 38* 01/02/2015 0959   GLUCOSE 164* 03/12/2015 0930   GLUCOSE 219* 01/02/2015 0959   BUN 19 03/12/2015 0930   BUN 11.6 01/02/2015 0959   CREATININE 1.66* 03/12/2015 0930   CREATININE 2.5* 01/02/2015 0959   CALCIUM 8.9 03/12/2015 0930   CALCIUM 10.1 01/02/2015 0959   CALCIUM 15.0* 12/03/2014 2104   PROT 6.0* 03/12/2015 0930   PROT 6.0* 01/02/2015 0959   ALBUMIN 3.1* 03/12/2015 0930   ALBUMIN 2.9* 01/02/2015 0959   AST 17 03/12/2015 0930   AST 11 01/02/2015 0959   ALT 13* 03/12/2015 0930   ALT 9 01/02/2015 0959   ALKPHOS 72 03/12/2015 0930   ALKPHOS 104 01/02/2015 0959   BILITOT 0.3 03/12/2015 0930   BILITOT 0.28 01/02/2015 0959   GFRNONAA 41* 03/12/2015 0930   GFRAA 47* 03/12/2015 0930   RADIOLOGY: CLINICAL DATA: Restaging non Hodgkin lymphoma, ongoing chemotherapy.  EXAM: CT CHEST, ABDOMEN AND PELVIS WITHOUT CONTRAST  TECHNIQUE: Multidetector CT imaging of the chest, abdomen and pelvis was performed following the standard protocol without IV contrast.  COMPARISON: 12/03/2014.  FINDINGS: CT CHEST FINDINGS  Mediastinum/Nodes: Mediastinal lymph nodes are not enlarged by CT size criteria. Hilar regions are difficult to  definitively evaluate without IV contrast. Ascending aorta measures approximately 4.0 cm. Three-vessel coronary artery calcification. Heart size normal. No pericardial effusion.  Lungs/Pleura: Mild to moderate centrilobular emphysema. Suspect progressive pulmonary parenchymal scarring in the right middle lobe and right lower lobe. Mild bronchial wall thickening. No pleural fluid. Airway is unremarkable.  Musculoskeletal: No worrisome lytic or sclerotic lesions. Degenerative changes are seen in the spine.  CT ABDOMEN AND PELVIS FINDINGS  Hepatobiliary: Liver and gallbladder are unremarkable. No biliary ductal dilatation.  Pancreas: Negative.  Spleen: Negative, normal in size.  Adrenals/Urinary Tract: Adrenal glands are unremarkable. Several low-attenuation lesions are seen off the kidneys bilaterally, measuring up to 8.8 cm on the left. A 4.6 cm lesion in the interpolar left kidney measures 18 Hounsfield units, stable. No urinary stones. Ureters are decompressed. Bladder is unremarkable.  Stomach/Bowel: Stomach, small bowel and colon are unremarkable. Appendectomy.  Vascular/Lymphatic: Atherosclerotic calcification of the arterial vasculature without abdominal aortic aneurysm. A retroperitoneal soft tissue mass with somewhat ill-defined borders measures 5.4 x 7.4 cm (previously 7.4 x 12.3 cm). It is seen in the aortocaval station and surrounds the IVC. Additional left periaortic lymph nodes have decreased in size as well, measuring up to 9 mm (previously 19 mm. Small lymph nodes are seen along the iliac chains bilaterally, not considered pathologically enlarged by CT size criteria.  Reproductive: Prostate is at the upper limits of normal in size.  Other: Small right inguinal hernia contains fat. There is haziness in the small bowel mesentery with the largest individual nodule measuring 10 mm in short axis, decreased from 1.6 cm. No free  fluid.  Musculoskeletal: No worrisome lytic or sclerotic lesions. Degenerative changes are seen in the spine.  IMPRESSION: 1. Interval response to therapy as evidenced by decrease in size of retroperitoneal and small bowel mesenteric adenopathy. 2. Three-vessel coronary artery calcification and borderline ascending aortic aneurysm. 3. Several low-attenuation lesions in the kidneys, with 1 intermediate density lesion in the left kidney, unchanged from 12/03/2014 but difficult to further characterize without post-contrast imaging.   Electronically Signed  By: Lorin Picket M.D.  On: 02/10/2015 14:10   ASSESSMENT & PLAN:  Stage IIX Non-Hodgkin's lymphoma, low-grade B cell lymphoma,  CD20 and CD10 positive, on a core biopsy of a retroperitoneal mass 12/05/2014 Hypercalcemia of malignancy-resolved, calcium of >15 mg/dl on 12/03/2014 ARF secondary to above Vitamin D 103 pg/ml on 12/04/2014 Pamidronate on 12/20/2014 Hyperglycemia secondary to prednisone/ known history diabetes   He has had a partial response to therapy thus far. I have recommended completing 6 cycles of treatment. I will also recommend maintenance Rituxan. Clinically he is markedly improved and is doing well.  Blood sugars are somewhat problematic while he is on the prednisone. The patient notes however with sliding scale insulin, they remain reasonable.  We will anticipate seeing him back in 3 weeks prior to his next cycle of chemotherapy.  All questions were answered. The patient knows to call the clinic with any problems, questions or concerns.    This note was electronically signed.   This document serves as a record of services personally performed by Ancil Linsey, MD. It was created on her behalf by Janace Hoard, a trained medical scribe. The creation of this record is based on the scribe's personal observations and the provider's statements to them. This document has been checked and approved by  the attending provider.  I have reviewed the above documentation for accuracy and completeness, and I agree with the above.  Kelby Fam. Whitney Muse, MD

## 2015-02-12 NOTE — Patient Instructions (Signed)
[facility] Discharge Instructions for Patients Receiving Chemotherapy  Today you received the following chemotherapy agents cytoxan, rituxan Neulasta OBI placed today to help increase white blood cells. Monitor for fevers, good hand washing, avoid large crowds or any one that is sick. Return in 3 weeks for chemotherapy and to see the doctor Please call the clinic if you have any questions or concerns   To help prevent nausea and vomiting after your treatment, we encourage you to take your nausea medication   If you develop nausea and vomiting, or diarrhea that is not controlled by your medication, call the clinic.  The clinic phone number is [phone]. Office hours are Monday-Friday 8:30am-5:00pm.  BELOW ARE SYMPTOMS THAT SHOULD BE REPORTED IMMEDIATELY:  *FEVER GREATER THAN 101.0 F  *CHILLS WITH OR WITHOUT FEVER  NAUSEA AND VOMITING THAT IS NOT CONTROLLED WITH YOUR NAUSEA MEDICATION  *UNUSUAL SHORTNESS OF BREATH  *UNUSUAL BRUISING OR BLEEDING  TENDERNESS IN MOUTH AND THROAT WITH OR WITHOUT PRESENCE OF ULCERS  *URINARY PROBLEMS  *BOWEL PROBLEMS  UNUSUAL RASH Items with * indicate a potential emergency and should be followed up as soon as possible. If you have an emergency after office hours please contact your primary care physician or go to the nearest emergency department.  Please call the clinic during office hours if you have any questions or concerns.   You may also contact the Patient Navigator at [phone] should you have any questions or need assistance in obtaining follow up care. _____________________________________________________________________ Have you asked about our STAR program?    STAR stands for Survivorship Training and Rehabilitation, and this is a nationally recognized cancer care program that focuses on survivorship and rehabilitation.  Cancer and cancer treatments may cause problems, such as, pain, making you feel tired and  keeping you from doing the things that you need or want to do. Cancer rehabilitation can help. Our goal is to reduce these troubling effects and help you have the best quality of life possible.  You may receive a survey from a nurse that asks questions about your current state of health.  Based on the survey results, all eligible patients will be referred to the [facility] program for an evaluation so we can better serve you! A frequently asked questions sheet is available upon request.

## 2015-02-14 ENCOUNTER — Telehealth (HOSPITAL_COMMUNITY): Payer: Self-pay | Admitting: *Deleted

## 2015-02-14 ENCOUNTER — Other Ambulatory Visit (HOSPITAL_COMMUNITY): Payer: Self-pay | Admitting: Hematology & Oncology

## 2015-02-14 NOTE — Telephone Encounter (Signed)
Patient notified of neutropenic precautions and he verbalized understanding. He states that blood sugars are running consistently over 300. He is on prednisone for 5 days and will finish Sunday. Reviewed sliding scale with patient and reinforced that he should come to ED if over 500 this weekend.

## 2015-03-05 ENCOUNTER — Ambulatory Visit (HOSPITAL_COMMUNITY): Payer: Non-veteran care | Admitting: Oncology

## 2015-03-05 ENCOUNTER — Inpatient Hospital Stay (HOSPITAL_COMMUNITY): Payer: Non-veteran care

## 2015-03-12 ENCOUNTER — Encounter (HOSPITAL_COMMUNITY): Payer: Medicare Other | Attending: Hematology & Oncology | Admitting: Oncology

## 2015-03-12 ENCOUNTER — Encounter (HOSPITAL_BASED_OUTPATIENT_CLINIC_OR_DEPARTMENT_OTHER): Payer: Medicare Other

## 2015-03-12 VITALS — BP 142/75 | HR 91 | Temp 98.0°F | Resp 18

## 2015-03-12 VITALS — BP 148/73 | HR 82 | Temp 98.5°F | Resp 22 | Wt 210.4 lb

## 2015-03-12 DIAGNOSIS — R590 Localized enlarged lymph nodes: Secondary | ICD-10-CM | POA: Diagnosis present

## 2015-03-12 DIAGNOSIS — C8290 Follicular lymphoma, unspecified, unspecified site: Secondary | ICD-10-CM

## 2015-03-12 DIAGNOSIS — C8590 Non-Hodgkin lymphoma, unspecified, unspecified site: Secondary | ICD-10-CM | POA: Diagnosis not present

## 2015-03-12 DIAGNOSIS — N189 Chronic kidney disease, unspecified: Secondary | ICD-10-CM | POA: Insufficient documentation

## 2015-03-12 DIAGNOSIS — E1122 Type 2 diabetes mellitus with diabetic chronic kidney disease: Secondary | ICD-10-CM | POA: Diagnosis present

## 2015-03-12 DIAGNOSIS — Z5112 Encounter for antineoplastic immunotherapy: Secondary | ICD-10-CM | POA: Diagnosis not present

## 2015-03-12 DIAGNOSIS — Z5111 Encounter for antineoplastic chemotherapy: Secondary | ICD-10-CM | POA: Diagnosis not present

## 2015-03-12 DIAGNOSIS — C8293 Follicular lymphoma, unspecified, intra-abdominal lymph nodes: Secondary | ICD-10-CM

## 2015-03-12 LAB — CBC WITH DIFFERENTIAL/PLATELET
BASOS ABS: 0.1 10*3/uL (ref 0.0–0.1)
BASOS PCT: 2 %
Eosinophils Absolute: 0.2 10*3/uL (ref 0.0–0.7)
Eosinophils Relative: 3 %
HEMATOCRIT: 39.2 % (ref 39.0–52.0)
HEMOGLOBIN: 12.9 g/dL — AB (ref 13.0–17.0)
Lymphocytes Relative: 20 %
Lymphs Abs: 1.5 10*3/uL (ref 0.7–4.0)
MCH: 29.4 pg (ref 26.0–34.0)
MCHC: 32.9 g/dL (ref 30.0–36.0)
MCV: 89.3 fL (ref 78.0–100.0)
MONOS PCT: 5 %
Monocytes Absolute: 0.4 10*3/uL (ref 0.1–1.0)
NEUTROS ABS: 5.3 10*3/uL (ref 1.7–7.7)
NEUTROS PCT: 70 %
Platelets: 297 10*3/uL (ref 150–400)
RBC: 4.39 MIL/uL (ref 4.22–5.81)
RDW: 13.9 % (ref 11.5–15.5)
WBC: 7.6 10*3/uL (ref 4.0–10.5)

## 2015-03-12 LAB — COMPREHENSIVE METABOLIC PANEL
ALBUMIN: 3.1 g/dL — AB (ref 3.5–5.0)
ALK PHOS: 72 U/L (ref 38–126)
ALT: 13 U/L — ABNORMAL LOW (ref 17–63)
AST: 17 U/L (ref 15–41)
Anion gap: 4 — ABNORMAL LOW (ref 5–15)
BILIRUBIN TOTAL: 0.3 mg/dL (ref 0.3–1.2)
BUN: 19 mg/dL (ref 6–20)
CALCIUM: 8.9 mg/dL (ref 8.9–10.3)
CO2: 39 mmol/L — ABNORMAL HIGH (ref 22–32)
Chloride: 96 mmol/L — ABNORMAL LOW (ref 101–111)
Creatinine, Ser: 1.66 mg/dL — ABNORMAL HIGH (ref 0.61–1.24)
GFR calc Af Amer: 47 mL/min — ABNORMAL LOW (ref >60)
GFR calc non Af Amer: 41 mL/min — ABNORMAL LOW (ref >60)
GLUCOSE: 164 mg/dL — AB (ref 65–99)
Potassium: 4.2 mmol/L (ref 3.5–5.1)
Sodium: 139 mmol/L (ref 135–145)
TOTAL PROTEIN: 6 g/dL — AB (ref 6.5–8.1)

## 2015-03-12 LAB — LACTATE DEHYDROGENASE: LDH: 177 U/L (ref 98–192)

## 2015-03-12 MED ORDER — SODIUM CHLORIDE 0.9 % IV SOLN
Freq: Once | INTRAVENOUS | Status: AC
  Administered 2015-03-12: 12:00:00 via INTRAVENOUS
  Filled 2015-03-12: qty 8

## 2015-03-12 MED ORDER — ACETAMINOPHEN 325 MG PO TABS
ORAL_TABLET | ORAL | Status: AC
  Filled 2015-03-12: qty 2

## 2015-03-12 MED ORDER — SODIUM CHLORIDE 0.9 % IV SOLN
Freq: Once | INTRAVENOUS | Status: AC
  Administered 2015-03-12: 11:00:00 via INTRAVENOUS

## 2015-03-12 MED ORDER — DIPHENHYDRAMINE HCL 25 MG PO CAPS
ORAL_CAPSULE | ORAL | Status: AC
  Filled 2015-03-12: qty 2

## 2015-03-12 MED ORDER — RITUXIMAB CHEMO INJECTION 500 MG/50ML
375.0000 mg/m2 | Freq: Once | INTRAVENOUS | Status: AC
  Administered 2015-03-12: 800 mg via INTRAVENOUS
  Filled 2015-03-12: qty 80

## 2015-03-12 MED ORDER — ACETAMINOPHEN 325 MG PO TABS
650.0000 mg | ORAL_TABLET | Freq: Once | ORAL | Status: AC
  Administered 2015-03-12: 650 mg via ORAL

## 2015-03-12 MED ORDER — SODIUM CHLORIDE 0.9 % IJ SOLN
10.0000 mL | INTRAMUSCULAR | Status: DC | PRN
  Administered 2015-03-12: 10 mL
  Filled 2015-03-12: qty 10

## 2015-03-12 MED ORDER — SODIUM CHLORIDE 0.9 % IV SOLN
400.0000 mg/m2 | Freq: Once | INTRAVENOUS | Status: AC
  Administered 2015-03-12: 880 mg via INTRAVENOUS
  Filled 2015-03-12: qty 44

## 2015-03-12 MED ORDER — DIPHENHYDRAMINE HCL 25 MG PO CAPS
50.0000 mg | ORAL_CAPSULE | Freq: Once | ORAL | Status: AC
  Administered 2015-03-12: 50 mg via ORAL

## 2015-03-12 NOTE — Assessment & Plan Note (Addendum)
Stage IIX NHL, low grade B cell lymphoma, C20 and CD10 positive on core biopsy of retroperitoneal mass on 12/05/2014.  On Cytoxan/Rituxan/Prednisone beginning on 12/11/2014.  Oncology history developed and updated.  Pre-chemotherapy labs as planned.  Future Rituxan maintenance plan built.  Discussed the future treatment plan with the patient and family.  CT CAP with contrast 1 month after cycle #6 for restaging.  Return in 3 weeks for follow-up, cycle #6, and labs.

## 2015-03-12 NOTE — Patient Instructions (Signed)
..  [place] at [facility] Discharge Instructions  RECOMMENDATIONS MADE BY THE CONSULTANT AND ANY TEST RESULTS WILL BE SENT TO YOUR REFERRING PHYSICIAN.  You will return in three weeks for follow up with the MD as well as your chemotherapy.  You will be doing Rituxan in the future for maintenance as discussed with the PA this visit.  We will schedule you for a CT scan one month after cycle #6 of your treatment.  Please see your schedule for appointment days and times.    Thank you for choosing [place] at [facility] to provide your oncology and hematology care.  To afford each patient quality time with our provider, please arrive at least 15 minutes before your scheduled appointment time.    You need to re-schedule your appointment should you arrive 10 or more minutes late.  We strive to give you quality time with our providers, and arriving late affects you and other patients whose appointments are after yours.  Also, if you no show three or more times for appointments you may be dismissed from the clinic at the providers discretion.     Again, thank you for choosing [facility].  Our hope is that these requests will decrease the amount of time that you wait before being seen by our physicians.       _____________________________________________________________  Should you have questions after your visit to [facility], please contact our office at [phone] between the hours of 8:30 a.m. and 4:30 p.m.  Voicemails left after 4:30 p.m. will not be returned until the following business day.  For prescription refill requests, have your pharmacy contact our office.

## 2015-03-12 NOTE — Patient Instructions (Signed)
[facility] Discharge Instructions for Patients Receiving Chemotherapy  Today you received the following chemotherapy agents Cytoxan and Rituxan. To help prevent nausea and vomiting after your treatment, we encourage you to take your nausea medication as instructed. If you develop nausea and vomiting that is not controlled by your nausea medication, call the clinic. If it is after clinic hours your family physician or the after hours number for the clinic or go to the Emergency Department. BELOW ARE SYMPTOMS THAT SHOULD BE REPORTED IMMEDIATELY:  *FEVER GREATER THAN 101.0 F  *CHILLS WITH OR WITHOUT FEVER  NAUSEA AND VOMITING THAT IS NOT CONTROLLED WITH YOUR NAUSEA MEDICATION  *UNUSUAL SHORTNESS OF BREATH  *UNUSUAL BRUISING OR BLEEDING  TENDERNESS IN MOUTH AND THROAT WITH OR WITHOUT PRESENCE OF ULCERS  *URINARY PROBLEMS  *BOWEL PROBLEMS  UNUSUAL RASH Items with * indicate a potential emergency and should be followed up as soon as possible.  Return as scheduled.  I have been informed and understand all the instructions given to me. I know to contact the clinic, my physician, or go to the Emergency Department if any problems should occur. I do not have any questions at this time, but understand that I may call the clinic during office hours or the Patient Navigator at [phone] should I have any questions or need assistance in obtaining follow up care.    __________________________________________  _____________  __________ Signature of Patient or Authorized Representative            Date                   Time    __________________________________________ Nurse's Signature

## 2015-03-12 NOTE — Progress Notes (Signed)
Tolerated chemo well. 

## 2015-03-12 NOTE — Progress Notes (Signed)
Pcp Not In System No address on file  Lymphoma, follicular - Plan: CBC with Differential, Comprehensive metabolic panel, Lactate dehydrogenase, CBC with Differential, Comprehensive metabolic panel, Lactate dehydrogenase, Sedimentation rate, CT Abdomen Pelvis W Contrast, CT Chest W Contrast  CURRENT THERAPY: Rituxan/Cytoxan/Prednisone  INTERVAL HISTORY: Billy Gray 69 y.o. male returns for followup of Stage IIX NHL, low grade B cell lymphoma, C20 and CD10 positive on core biopsy of retroperitoneal mass on 12/05/2014.     Lymphoma, follicular   8/67/6720 Imaging CT CAP- Prominent retroperitoneal lymphadenopathy most likely to represent lymphoma.    12/05/2014 Pathology Results Soft Tissue Needle Core Biopsy, right retroperitoneal - FOLLICULAR LYMPHOMA. Low grade. lymphocytes are positive for CD20, CD79a, CD10, bcl-6, and bcl-2. 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   12/05/2014 Pathology Results Tissue-Flow Cytometry - MONOCLONAL B-CELL POPULATION IDENTIFIED.  There is a monoclonal population of B-cell with expression of CD10. 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   12/11/2014 -  Chemotherapy Rituxan, cytoxan, prednisone   02/10/2015 Imaging CT CAP- Interval response to therapy as evidenced by decrease in size of retroperitoneal and small bowel mesenteric adenopathy.   I personally reviewed and went over laboratory results with the patient.  The results are noted within this dictation.  He is tolerating therapy well without any complaints.  His breathing remains an issue for him and is unrelated to his hematologic diagnosis and treatment.  He notes that he is at baseline regarding this.  He has an upcoming appointment at the [facility] to evaluate his medications that were changed for his COPD.  "How many more treatments?"  He will get 6 cycles of Cytoxan/Rituxan/Prednisone followed by maintenance Rituxan x 2 years.   He is taking his insulin sliding scale accordingly while he is on his Prednisone.   Past Medical History  Diagnosis Date  . COPD (chronic obstructive pulmonary disease)   . Hypertension   . Diabetes mellitus without complication   . Chronic kidney disease     has Acute on chronic kidney failure; Hypercalcemia; HTN (hypertension); DM2 (diabetes mellitus, type 2); Retroperitoneal lymphadenopathy; Lymphoma, follicular; Chronic respiratory failure; COPD with acute exacerbation; and Peripheral edema on his problem list.     has No Known Allergies.  Current Outpatient Prescriptions on File Prior to Visit  Medication Sig Dispense Refill  . acetaminophen (TYLENOL) 500 MG tablet Take 500 mg by mouth every 6 (six) hours as needed for moderate pain.    Marland Kitchen albuterol (PROVENTIL HFA;VENTOLIN HFA) 108 (90 BASE) MCG/ACT inhaler Inhale 2 puffs into the lungs every 6 (six) hours as needed for wheezing or shortness of breath.    . allopurinol (ZYLOPRIM) 100 MG tablet Take 1 tablet (100 mg total) by mouth daily. (Patient taking differently: Take 150 mg by mouth daily. ) 30 tablet 0  . amLODipine (NORVASC) 5 MG tablet Take 1 tablet (5 mg total) by mouth daily. 30 tablet 4  . budesonide-formoterol (SYMBICORT) 160-4.5 MCG/ACT inhaler Inhale 2 puffs into the lungs 2 (two) times daily.     . insulin glargine (LANTUS) 100 UNIT/ML injection Inject 0.15 mLs (15 Units total) into the skin at bedtime. (Patient taking differently: Inject 35 Units into the skin at bedtime. ) 10 mL 0  . insulin lispro (HUMALOG) 100 UNIT/ML injection 151-200 = 2 units, 201-250 = 4 units, 251-300 = 6 units, 301 or greater = 8 units & call MD 10 mL 1  .  pravastatin (PRAVACHOL) 40 MG tablet Take 40 mg by mouth every evening.    . predniSONE (DELTASONE) 20 MG tablet Take 3 tablets (60 mg total) by mouth daily with breakfast. Take for 5 days 15 tablet 4  . tiotropium (SPIRIVA) 18 MCG inhalation capsule Place 18 mcg into inhaler and inhale 2  (two) times daily. 2 puffs 2 times daily    . traZODone (DESYREL) 50 MG tablet Take 50 mg by mouth at bedtime.    Marland Kitchen zolpidem (AMBIEN) 10 MG tablet Take 10 mg by mouth at bedtime.    . gabapentin (NEURONTIN) 800 MG tablet Take 400 mg by mouth at bedtime. Take 1/2 tablet (400mg ) every HS.    Marland Kitchen HYDROcodone-acetaminophen (NORCO/VICODIN) 5-325 MG per tablet      No current facility-administered medications on file prior to visit.    Past Surgical History  Procedure Laterality Date  . Replacement total knee      right  . Appendectomy      Denies any headaches, dizziness, double vision, fevers, chills, night sweats, nausea, vomiting, diarrhea, constipation, chest pain, heart palpitations, shortness of breath, blood in stool, black tarry stool, urinary pain, urinary burning, urinary frequency, hematuria.   PHYSICAL EXAMINATION  ECOG PERFORMANCE STATUS: 1 - Symptomatic but completely ambulatory  Filed Vitals:   03/12/15 0913  BP: 148/73  Pulse: 82  Temp: 98.5 F (36.9 C)  Resp: 22    GENERAL:alert, no distress, well nourished, well developed, comfortable, cooperative and smiling SKIN: skin color, texture, turgor are normal, no rashes or significant lesions HEAD: Normocephalic, No masses, lesions, tenderness or abnormalities EYES: normal, PERRLA, EOMI, Conjunctiva are pink and non-injected EARS: External ears normal OROPHARYNX:lips, buccal mucosa, and tongue normal and mucous membranes are moist  NECK: supple, no adenopathy, thyroid normal size, non-tender, without nodularity, no stridor, non-tender, trachea midline LYMPH:  no palpable lymphadenopathy, no hepatosplenomegaly BREAST:not examined LUNGS: decreased breath sounds HEART: regular rate & rhythm, no murmurs, no gallops, S1 normal and S2 normal ABDOMEN:abdomen soft, non-tender, normal bowel sounds and no masses or organomegaly BACK: Back symmetric, no curvature. EXTREMITIES:less then 2 second capillary refill, no joint  deformities, effusion, or inflammation, no skin discoloration, no cyanosis  NEURO: alert & oriented x 3 with fluent speech, no focal motor/sensory deficits, gait normal   LABORATORY DATA: CBC    Component Value Date/Time   WBC 4.0 02/12/2015 0830   WBC 8.3 01/02/2015 0959   RBC 4.06* 02/12/2015 0830   RBC 4.10* 01/02/2015 0959   HGB 11.8* 02/12/2015 0830   HGB 12.3* 01/02/2015 0959   HCT 36.1* 02/12/2015 0830   HCT 36.8* 01/02/2015 0959   PLT 188 02/12/2015 0830   PLT 258 01/02/2015 0959   MCV 88.9 02/12/2015 0830   MCV 89.8 01/02/2015 0959   MCH 29.1 02/12/2015 0830   MCH 30.0 01/02/2015 0959   MCHC 32.7 02/12/2015 0830   MCHC 33.4 01/02/2015 0959   RDW 13.0 02/12/2015 0830   RDW 13.3 01/02/2015 0959   LYMPHSABS 1.6 02/12/2015 0830   LYMPHSABS 1.9 01/02/2015 0959   MONOABS 0.4 02/12/2015 0830   MONOABS 1.3* 01/02/2015 0959   EOSABS 0.3 02/12/2015 0830   EOSABS 0.7* 01/02/2015 0959   BASOSABS 0.0 02/12/2015 0830   BASOSABS 0.1 01/02/2015 0959      Chemistry      Component Value Date/Time   NA 137 02/12/2015 0830   NA 140 01/02/2015 0959   K 4.2 02/12/2015 0830   K 4.3 01/02/2015 0959   CL  92* 02/12/2015 0830   CO2 39* 02/12/2015 0830   CO2 38* 01/02/2015 0959   BUN 13 02/12/2015 0830   BUN 11.6 01/02/2015 0959   CREATININE 1.95* 02/12/2015 0830   CREATININE 2.5* 01/02/2015 0959   GLU 203* 12/20/2014 1609      Component Value Date/Time   CALCIUM 8.5* 02/12/2015 0830   CALCIUM 10.1 01/02/2015 0959   CALCIUM 15.0* 12/03/2014 2104   ALKPHOS 57 02/12/2015 0830   ALKPHOS 104 01/02/2015 0959   AST 14* 02/12/2015 0830   AST 11 01/02/2015 0959   ALT 11* 02/12/2015 0830   ALT 9 01/02/2015 0959   BILITOT 0.4 02/12/2015 0830   BILITOT 0.28 01/02/2015 0959        PENDING LABS:   RADIOGRAPHIC STUDIES:  Ct Abdomen Pelvis Wo Contrast  02/10/2015   CLINICAL DATA:  Restaging non Hodgkin lymphoma, ongoing chemotherapy.  EXAM: CT CHEST, ABDOMEN AND PELVIS  WITHOUT CONTRAST  TECHNIQUE: Multidetector CT imaging of the chest, abdomen and pelvis was performed following the standard protocol without IV contrast.  COMPARISON:  12/03/2014.  FINDINGS: CT CHEST FINDINGS  Mediastinum/Nodes: Mediastinal lymph nodes are not enlarged by CT size criteria. Hilar regions are difficult to definitively evaluate without IV contrast. Ascending aorta measures approximately 4.0 cm. Three-vessel coronary artery calcification. Heart size normal. No pericardial effusion.  Lungs/Pleura: Mild to moderate centrilobular emphysema. Suspect progressive pulmonary parenchymal scarring in the right middle lobe and right lower lobe. Mild bronchial wall thickening. No pleural fluid. Airway is unremarkable.  Musculoskeletal: No worrisome lytic or sclerotic lesions. Degenerative changes are seen in the spine.  CT ABDOMEN AND PELVIS FINDINGS  Hepatobiliary: Liver and gallbladder are unremarkable. No biliary ductal dilatation.  Pancreas: Negative.  Spleen: Negative, normal in size.  Adrenals/Urinary Tract: Adrenal glands are unremarkable. Several low-attenuation lesions are seen off the kidneys bilaterally, measuring up to 8.8 cm on the left. A 4.6 cm lesion in the interpolar left kidney measures 18 Hounsfield units, stable. No urinary stones. Ureters are decompressed. Bladder is unremarkable.  Stomach/Bowel: Stomach, small bowel and colon are unremarkable. Appendectomy.  Vascular/Lymphatic: Atherosclerotic calcification of the arterial vasculature without abdominal aortic aneurysm. A retroperitoneal soft tissue mass with somewhat ill-defined borders measures 5.4 x 7.4 cm (previously 7.4 x 12.3 cm). It is seen in the aortocaval station and surrounds the IVC. Additional left periaortic lymph nodes have decreased in size as well, measuring up to 9 mm (previously 19 mm. Small lymph nodes are seen along the iliac chains bilaterally, not considered pathologically enlarged by CT size criteria.  Reproductive:  Prostate is at the upper limits of normal in size.  Other: Small right inguinal hernia contains fat. There is haziness in the small bowel mesentery with the largest individual nodule measuring 10 mm in short axis, decreased from 1.6 cm. No free fluid.  Musculoskeletal: No worrisome lytic or sclerotic lesions. Degenerative changes are seen in the spine.  IMPRESSION: 1. Interval response to therapy as evidenced by decrease in size of retroperitoneal and small bowel mesenteric adenopathy. 2. Three-vessel coronary artery calcification and borderline ascending aortic aneurysm. 3. Several low-attenuation lesions in the kidneys, with 1 intermediate density lesion in the left kidney, unchanged from 12/03/2014 but difficult to further characterize without post-contrast imaging.   Electronically Signed   By: Lorin Picket M.D.   On: 02/10/2015 14:10   Ct Chest Wo Contrast  02/10/2015   CLINICAL DATA:  Restaging non Hodgkin lymphoma, ongoing chemotherapy.  EXAM: CT CHEST, ABDOMEN AND PELVIS WITHOUT CONTRAST  TECHNIQUE: Multidetector CT imaging of the chest, abdomen and pelvis was performed following the standard protocol without IV contrast.  COMPARISON:  12/03/2014.  FINDINGS: CT CHEST FINDINGS  Mediastinum/Nodes: Mediastinal lymph nodes are not enlarged by CT size criteria. Hilar regions are difficult to definitively evaluate without IV contrast. Ascending aorta measures approximately 4.0 cm. Three-vessel coronary artery calcification. Heart size normal. No pericardial effusion.  Lungs/Pleura: Mild to moderate centrilobular emphysema. Suspect progressive pulmonary parenchymal scarring in the right middle lobe and right lower lobe. Mild bronchial wall thickening. No pleural fluid. Airway is unremarkable.  Musculoskeletal: No worrisome lytic or sclerotic lesions. Degenerative changes are seen in the spine.  CT ABDOMEN AND PELVIS FINDINGS  Hepatobiliary: Liver and gallbladder are unremarkable. No biliary ductal dilatation.   Pancreas: Negative.  Spleen: Negative, normal in size.  Adrenals/Urinary Tract: Adrenal glands are unremarkable. Several low-attenuation lesions are seen off the kidneys bilaterally, measuring up to 8.8 cm on the left. A 4.6 cm lesion in the interpolar left kidney measures 18 Hounsfield units, stable. No urinary stones. Ureters are decompressed. Bladder is unremarkable.  Stomach/Bowel: Stomach, small bowel and colon are unremarkable. Appendectomy.  Vascular/Lymphatic: Atherosclerotic calcification of the arterial vasculature without abdominal aortic aneurysm. A retroperitoneal soft tissue mass with somewhat ill-defined borders measures 5.4 x 7.4 cm (previously 7.4 x 12.3 cm). It is seen in the aortocaval station and surrounds the IVC. Additional left periaortic lymph nodes have decreased in size as well, measuring up to 9 mm (previously 19 mm. Small lymph nodes are seen along the iliac chains bilaterally, not considered pathologically enlarged by CT size criteria.  Reproductive: Prostate is at the upper limits of normal in size.  Other: Small right inguinal hernia contains fat. There is haziness in the small bowel mesentery with the largest individual nodule measuring 10 mm in short axis, decreased from 1.6 cm. No free fluid.  Musculoskeletal: No worrisome lytic or sclerotic lesions. Degenerative changes are seen in the spine.  IMPRESSION: 1. Interval response to therapy as evidenced by decrease in size of retroperitoneal and small bowel mesenteric adenopathy. 2. Three-vessel coronary artery calcification and borderline ascending aortic aneurysm. 3. Several low-attenuation lesions in the kidneys, with 1 intermediate density lesion in the left kidney, unchanged from 12/03/2014 but difficult to further characterize without post-contrast imaging.   Electronically Signed   By: Lorin Picket M.D.   On: 02/10/2015 14:10     PATHOLOGY:    ASSESSMENT AND PLAN:  Lymphoma, follicular Stage IIX NHL, low grade B  cell lymphoma, C20 and CD10 positive on core biopsy of retroperitoneal mass on 12/05/2014.  On Cytoxan/Rituxan/Prednisone beginning on 12/11/2014.  Oncology history developed and updated.  Pre-chemotherapy labs as planned.  Future Rituxan maintenance plan built.  Discussed the future treatment plan with the patient and family.  CT CAP with contrast 1 month after cycle #6 for restaging.  Return in 3 weeks for follow-up, cycle #6, and labs.    THERAPY PLAN:  Will continue with treatment as planned.  He will receive 6 cycles of chemotherapy followed by Rituxan maintenance every 2 months.  All questions were answered. The patient knows to call the clinic with any problems, questions or concerns. We can certainly see the patient much sooner if necessary.  Patient and plan discussed with Dr. Ancil Linsey and she is in agreement with the aforementioned.   This note is electronically signed by: Doy Mince 03/12/2015 9:41 AM

## 2015-03-14 ENCOUNTER — Encounter (HOSPITAL_COMMUNITY): Payer: Self-pay | Admitting: Hematology & Oncology

## 2015-03-19 ENCOUNTER — Telehealth (HOSPITAL_COMMUNITY): Payer: Self-pay | Admitting: Emergency Medicine

## 2015-03-19 NOTE — Telephone Encounter (Signed)
Pt called wanted to know about a medication the [facility] gave him called tussinex, told him it was a cough syrup and it was a cough syrup and it was ok to take it

## 2015-03-20 ENCOUNTER — Observation Stay (HOSPITAL_BASED_OUTPATIENT_CLINIC_OR_DEPARTMENT_OTHER): Payer: Medicare Other

## 2015-03-20 ENCOUNTER — Encounter (HOSPITAL_COMMUNITY): Payer: Self-pay | Admitting: Emergency Medicine

## 2015-03-20 ENCOUNTER — Emergency Department (HOSPITAL_COMMUNITY): Payer: Medicare Other

## 2015-03-20 ENCOUNTER — Inpatient Hospital Stay (HOSPITAL_COMMUNITY)
Admission: EM | Admit: 2015-03-20 | Discharge: 2015-03-22 | DRG: 191 | Disposition: A | Discharge status: Home / Self Care | Payer: Medicare Other | Attending: Family Medicine | Admitting: Family Medicine

## 2015-03-20 DIAGNOSIS — E1121 Type 2 diabetes mellitus with diabetic nephropathy: Secondary | ICD-10-CM

## 2015-03-20 DIAGNOSIS — I129 Hypertensive chronic kidney disease with stage 1 through stage 4 chronic kidney disease, or unspecified chronic kidney disease: Secondary | ICD-10-CM | POA: Diagnosis present

## 2015-03-20 DIAGNOSIS — B961 Klebsiella pneumoniae [K. pneumoniae] as the cause of diseases classified elsewhere: Secondary | ICD-10-CM | POA: Diagnosis present

## 2015-03-20 DIAGNOSIS — R012 Other cardiac sounds: Secondary | ICD-10-CM

## 2015-03-20 DIAGNOSIS — C8290 Follicular lymphoma, unspecified, unspecified site: Secondary | ICD-10-CM | POA: Diagnosis present

## 2015-03-20 DIAGNOSIS — R7881 Bacteremia: Secondary | ICD-10-CM | POA: Diagnosis not present

## 2015-03-20 DIAGNOSIS — E1122 Type 2 diabetes mellitus with diabetic chronic kidney disease: Secondary | ICD-10-CM | POA: Diagnosis present

## 2015-03-20 DIAGNOSIS — J441 Chronic obstructive pulmonary disease with (acute) exacerbation: Secondary | ICD-10-CM | POA: Diagnosis not present

## 2015-03-20 DIAGNOSIS — D638 Anemia in other chronic diseases classified elsewhere: Secondary | ICD-10-CM | POA: Diagnosis present

## 2015-03-20 DIAGNOSIS — R0602 Shortness of breath: Secondary | ICD-10-CM | POA: Diagnosis not present

## 2015-03-20 DIAGNOSIS — Z809 Family history of malignant neoplasm, unspecified: Secondary | ICD-10-CM

## 2015-03-20 DIAGNOSIS — N183 Chronic kidney disease, stage 3 (moderate): Secondary | ICD-10-CM | POA: Diagnosis present

## 2015-03-20 DIAGNOSIS — J9611 Chronic respiratory failure with hypoxia: Secondary | ICD-10-CM

## 2015-03-20 DIAGNOSIS — R0781 Pleurodynia: Secondary | ICD-10-CM

## 2015-03-20 DIAGNOSIS — Z833 Family history of diabetes mellitus: Secondary | ICD-10-CM

## 2015-03-20 DIAGNOSIS — Z87891 Personal history of nicotine dependence: Secondary | ICD-10-CM

## 2015-03-20 DIAGNOSIS — Z9221 Personal history of antineoplastic chemotherapy: Secondary | ICD-10-CM

## 2015-03-20 DIAGNOSIS — N189 Chronic kidney disease, unspecified: Secondary | ICD-10-CM

## 2015-03-20 DIAGNOSIS — C8330 Diffuse large B-cell lymphoma, unspecified site: Secondary | ICD-10-CM | POA: Diagnosis present

## 2015-03-20 DIAGNOSIS — E669 Obesity, unspecified: Secondary | ICD-10-CM | POA: Diagnosis present

## 2015-03-20 HISTORY — DX: Non-Hodgkin lymphoma, unspecified, unspecified site: C85.90

## 2015-03-20 LAB — CBC WITH DIFFERENTIAL/PLATELET
Basophils Absolute: 0 10*3/uL (ref 0.0–0.1)
Basophils Relative: 0 %
EOS PCT: 10 %
Eosinophils Absolute: 0.6 10*3/uL (ref 0.0–0.7)
HCT: 37 % — ABNORMAL LOW (ref 39.0–52.0)
HEMOGLOBIN: 11.9 g/dL — AB (ref 13.0–17.0)
LYMPHS ABS: 0.8 10*3/uL (ref 0.7–4.0)
LYMPHS PCT: 14 %
MCH: 28.7 pg (ref 26.0–34.0)
MCHC: 32.2 g/dL (ref 30.0–36.0)
MCV: 89.4 fL (ref 78.0–100.0)
MONOS PCT: 14 %
Monocytes Absolute: 0.8 10*3/uL (ref 0.1–1.0)
NEUTROS PCT: 62 %
Neutro Abs: 3.4 10*3/uL (ref 1.7–7.7)
Platelets: 219 10*3/uL (ref 150–400)
RBC: 4.14 MIL/uL — AB (ref 4.22–5.81)
RDW: 13.8 % (ref 11.5–15.5)
WBC: 5.6 10*3/uL (ref 4.0–10.5)

## 2015-03-20 LAB — URINE MICROSCOPIC-ADD ON

## 2015-03-20 LAB — BASIC METABOLIC PANEL
Anion gap: 7 (ref 5–15)
BUN: 32 mg/dL — AB (ref 6–20)
CO2: 36 mmol/L — AB (ref 22–32)
Calcium: 8.9 mg/dL (ref 8.9–10.3)
Chloride: 95 mmol/L — ABNORMAL LOW (ref 101–111)
Creatinine, Ser: 1.63 mg/dL — ABNORMAL HIGH (ref 0.61–1.24)
GFR calc Af Amer: 48 mL/min — ABNORMAL LOW (ref >60)
GFR calc non Af Amer: 42 mL/min — ABNORMAL LOW (ref >60)
Glucose, Bld: 168 mg/dL — ABNORMAL HIGH (ref 65–99)
POTASSIUM: 4.2 mmol/L (ref 3.5–5.1)
SODIUM: 138 mmol/L (ref 135–145)

## 2015-03-20 LAB — URINALYSIS, ROUTINE W REFLEX MICROSCOPIC
Bilirubin Urine: NEGATIVE
GLUCOSE, UA: NEGATIVE mg/dL
Ketones, ur: NEGATIVE mg/dL
Leukocytes, UA: NEGATIVE
Nitrite: NEGATIVE
PH: 6 (ref 5.0–8.0)
Protein, ur: 30 mg/dL — AB
SPECIFIC GRAVITY, URINE: 1.025 (ref 1.005–1.030)
Urobilinogen, UA: 0.2 mg/dL (ref 0.0–1.0)

## 2015-03-20 LAB — GLUCOSE, CAPILLARY
GLUCOSE-CAPILLARY: 420 mg/dL — AB (ref 65–99)
GLUCOSE-CAPILLARY: 306 mg/dL — AB (ref 65–99)

## 2015-03-20 LAB — TROPONIN I: Troponin I: <0.03 ng/mL (ref <0.031)

## 2015-03-20 LAB — BRAIN NATRIURETIC PEPTIDE: B Natriuretic Peptide: 74 pg/mL (ref 0.0–100.0)

## 2015-03-20 MED ORDER — INSULIN ASPART 100 UNIT/ML ~~LOC~~ SOLN
0.0000 [IU] | Freq: Every day | SUBCUTANEOUS | Status: DC
  Administered 2015-03-20: 4 [IU] via SUBCUTANEOUS
  Administered 2015-03-21: 3 [IU] via SUBCUTANEOUS

## 2015-03-20 MED ORDER — SODIUM CHLORIDE 0.9 % IJ SOLN
3.0000 mL | Freq: Two times a day (BID) | INTRAMUSCULAR | Status: DC
  Administered 2015-03-20 – 2015-03-22 (×4): 3 mL via INTRAVENOUS

## 2015-03-20 MED ORDER — SODIUM CHLORIDE 0.9 % IJ SOLN: 3.0000 mL | INTRAMUSCULAR | Status: DC | PRN

## 2015-03-20 MED ORDER — METHYLPREDNISOLONE SODIUM SUCC 40 MG IJ SOLR
40.0000 mg | Freq: Two times a day (BID) | INTRAMUSCULAR | Status: DC
  Administered 2015-03-20 – 2015-03-21 (×3): 40 mg via INTRAVENOUS
  Filled 2015-03-20 (×3): qty 1

## 2015-03-20 MED ORDER — ALBUTEROL (5 MG/ML) CONTINUOUS INHALATION SOLN
10.0000 mg/h | INHALATION_SOLUTION | Freq: Once | RESPIRATORY_TRACT | Status: AC
  Administered 2015-03-20: 10 mg/h via RESPIRATORY_TRACT

## 2015-03-20 MED ORDER — INFLUENZA VAC SPLIT QUAD 0.5 ML IM SUSY
0.5000 mL | PREFILLED_SYRINGE | INTRAMUSCULAR | Status: AC
  Administered 2015-03-21: 0.5 mL via INTRAMUSCULAR
  Filled 2015-03-20: qty 0.5

## 2015-03-20 MED ORDER — ACETAMINOPHEN 650 MG RE SUPP: 650.0000 mg | Freq: Four times a day (QID) | RECTAL | Status: DC | PRN

## 2015-03-20 MED ORDER — INSULIN GLARGINE 100 UNIT/ML ~~LOC~~ SOLN
35.0000 [IU] | Freq: Every day | SUBCUTANEOUS | Status: DC
  Administered 2015-03-20 – 2015-03-21 (×2): 35 [IU] via SUBCUTANEOUS
  Filled 2015-03-20 (×4): qty 0.35

## 2015-03-20 MED ORDER — DEXTROSE 5 % IV SOLN
2.0000 g | INTRAVENOUS | Status: DC
  Administered 2015-03-20 – 2015-03-21 (×2): 2 g via INTRAVENOUS
  Filled 2015-03-20 (×5): qty 2

## 2015-03-20 MED ORDER — INSULIN ASPART 100 UNIT/ML ~~LOC~~ SOLN
20.0000 [IU] | Freq: Once | SUBCUTANEOUS | Status: AC
  Administered 2015-03-20: 20 [IU] via SUBCUTANEOUS

## 2015-03-20 MED ORDER — ALBUTEROL SULFATE (2.5 MG/3ML) 0.083% IN NEBU: 2.5000 mg | INHALATION_SOLUTION | RESPIRATORY_TRACT | Status: DC | PRN

## 2015-03-20 MED ORDER — PIPERACILLIN-TAZOBACTAM 3.375 G IVPB 30 MIN
3.3750 g | Freq: Once | INTRAVENOUS | Status: AC
  Administered 2015-03-20: 3.375 g via INTRAVENOUS
  Filled 2015-03-20: qty 50

## 2015-03-20 MED ORDER — IPRATROPIUM BROMIDE 0.02 % IN SOLN
0.5000 mg | Freq: Once | RESPIRATORY_TRACT | Status: AC
  Administered 2015-03-20: 0.5 mg via RESPIRATORY_TRACT
  Filled 2015-03-20: qty 2.5

## 2015-03-20 MED ORDER — IPRATROPIUM-ALBUTEROL 0.5-2.5 (3) MG/3ML IN SOLN
3.0000 mL | RESPIRATORY_TRACT | Status: DC
  Administered 2015-03-20 – 2015-03-22 (×12): 3 mL via RESPIRATORY_TRACT
  Filled 2015-03-20 (×12): qty 3

## 2015-03-20 MED ORDER — ALLOPURINOL 300 MG PO TABS
150.0000 mg | ORAL_TABLET | Freq: Every day | ORAL | Status: DC
  Administered 2015-03-20 – 2015-03-22 (×3): 150 mg via ORAL
  Filled 2015-03-20 (×3): qty 1

## 2015-03-20 MED ORDER — ACETAMINOPHEN 325 MG PO TABS: 650.0000 mg | ORAL_TABLET | Freq: Four times a day (QID) | ORAL | Status: DC | PRN

## 2015-03-20 MED ORDER — ASPIRIN EC 81 MG PO TBEC
81.0000 mg | DELAYED_RELEASE_TABLET | Freq: Every day | ORAL | Status: DC
  Administered 2015-03-20 – 2015-03-22 (×3): 81 mg via ORAL
  Filled 2015-03-20 (×3): qty 1

## 2015-03-20 MED ORDER — SODIUM CHLORIDE 0.9 % IV SOLN
250.0000 mL | INTRAVENOUS | Status: DC | PRN
  Administered 2015-03-20: 250 mL via INTRAVENOUS

## 2015-03-20 MED ORDER — BUDESONIDE-FORMOTEROL FUMARATE 160-4.5 MCG/ACT IN AERO
2.0000 | INHALATION_SPRAY | Freq: Two times a day (BID) | RESPIRATORY_TRACT | Status: DC
  Administered 2015-03-20 – 2015-03-22 (×5): 2 via RESPIRATORY_TRACT
  Filled 2015-03-20: qty 6

## 2015-03-20 MED ORDER — INSULIN ASPART 100 UNIT/ML ~~LOC~~ SOLN
0.0000 [IU] | Freq: Three times a day (TID) | SUBCUTANEOUS | Status: DC
  Administered 2015-03-21: 7 [IU] via SUBCUTANEOUS
  Administered 2015-03-21: 4 [IU] via SUBCUTANEOUS
  Administered 2015-03-21: 15 [IU] via SUBCUTANEOUS
  Administered 2015-03-22: 3 [IU] via SUBCUTANEOUS
  Administered 2015-03-22: 15 [IU] via SUBCUTANEOUS

## 2015-03-20 MED ORDER — TRAZODONE HCL 50 MG PO TABS
50.0000 mg | ORAL_TABLET | Freq: Every day | ORAL | Status: DC
  Administered 2015-03-20 – 2015-03-21 (×2): 50 mg via ORAL
  Filled 2015-03-20 (×2): qty 1

## 2015-03-20 MED ORDER — AMLODIPINE BESYLATE 5 MG PO TABS
5.0000 mg | ORAL_TABLET | Freq: Every day | ORAL | Status: DC
  Administered 2015-03-20 – 2015-03-22 (×3): 5 mg via ORAL
  Filled 2015-03-20 (×3): qty 1

## 2015-03-20 MED ORDER — TIOTROPIUM BROMIDE MONOHYDRATE 18 MCG IN CAPS
18.0000 ug | ORAL_CAPSULE | Freq: Every day | RESPIRATORY_TRACT | Status: DC
  Filled 2015-03-20: qty 5

## 2015-03-20 MED ORDER — PRAVASTATIN SODIUM 40 MG PO TABS
40.0000 mg | ORAL_TABLET | Freq: Every evening | ORAL | Status: DC
  Administered 2015-03-20 – 2015-03-21 (×2): 40 mg via ORAL
  Filled 2015-03-20 (×2): qty 1

## 2015-03-20 MED ORDER — ENOXAPARIN SODIUM 40 MG/0.4ML ~~LOC~~ SOLN
40.0000 mg | SUBCUTANEOUS | Status: DC
  Administered 2015-03-20 – 2015-03-22 (×3): 40 mg via SUBCUTANEOUS
  Filled 2015-03-20 (×3): qty 0.4

## 2015-03-20 MED ORDER — INSULIN ASPART 100 UNIT/ML ~~LOC~~ SOLN: 0.0000 [IU] | Freq: Three times a day (TID) | SUBCUTANEOUS | Status: DC

## 2015-03-20 MED ORDER — SODIUM CHLORIDE 0.9 % IJ SOLN
3.0000 mL | Freq: Two times a day (BID) | INTRAMUSCULAR | Status: DC
  Administered 2015-03-20 – 2015-03-21 (×3): 3 mL via INTRAVENOUS

## 2015-03-20 MED ORDER — METHYLPREDNISOLONE SODIUM SUCC 125 MG IJ SOLR
125.0000 mg | Freq: Once | INTRAMUSCULAR | Status: AC
  Administered 2015-03-20: 125 mg via INTRAVENOUS
  Filled 2015-03-20: qty 2

## 2015-03-20 MED ORDER — CETYLPYRIDINIUM CHLORIDE 0.05 % MT LIQD
7.0000 mL | Freq: Two times a day (BID) | OROMUCOSAL | Status: DC
  Administered 2015-03-21 – 2015-03-22 (×3): 7 mL via OROMUCOSAL

## 2015-03-20 MED ORDER — INSULIN ASPART 100 UNIT/ML ~~LOC~~ SOLN: 0.0000 [IU] | Freq: Every day | SUBCUTANEOUS | Status: DC

## 2015-03-20 NOTE — ED Notes (Signed)
Pt's o2 sat decreased to 89% on 3liters while eating breakfast.  Increased to 3.5liters and sat stayed around 89%.  Increased to 4liters and 02 sat 93%.  Will notify receiving RN.

## 2015-03-20 NOTE — Progress Notes (Addendum)
1743 CBG 420, MD notified.

## 2015-03-20 NOTE — ED Notes (Signed)
Discontinued breathing treatment per respiratory therapy.  Meal tray given.

## 2015-03-20 NOTE — ED Notes (Signed)
Pt was seen at [facility] yesterday for chest pain and they called this am to tell him that he has gram negative rods in blood sample. Pt did not want to to back to [facility] so they told him to go to nearest facility.

## 2015-03-20 NOTE — ED Notes (Signed)
Continuous neb treatment still in progress.  Coffee given to wife at bedside.  Pt says is comfortable and breathing easier.

## 2015-03-20 NOTE — H&P (Signed)
History and Physical  Billy Gray UUE:[id number] DOB: 1945-09-13 DOA: 03/20/2015  Referring physician: Rolland Porter, MD PCP: Pcp Not In System   Chief Complaint: Abnormal Labs, SOB  HPI:  69 y/o male with a hx of stage II non-Hodgkin's lymphoma, currently undergoing chemotherapy (last performed 9/21 with Rituxan and Cytoxan) presented with SOB and abnormal culture results. Patient was seen at the [facility] ED on 9/28 for right sided pleuritic chest pain.   Per Dr. Tomi Bamberger, "Records were obtained from patient ED visit on September 28 from [facility] in [place]. He had a chest x-ray showing normal heart size is no focal infiltrate. Mild tenting of the right hemidiaphragm. Spondylosis but no acute abnormality. His troponin was positive at 0.0-4 with upper limits of normal being 0.10. Patient has a renal insufficiency and CT angiogram of the chest could not be done to rule out PE. Patient had a nuclear medicine lung scan both ventilation and perfusion showing low probability for pulmonary embolus. His discharge diagnosis was COPD with acute exacerbation and pleurisy. He was prescribed the Tussionex. His blood culture was read at 3:30 this morning as being positive for gram-negative rods, the second blood culture had not been read yet. "   Patient complains of 8/10 pain to the right side of his chest since 9/26.  It is stabbing and worse with taking a deep breath. He also reports an associated cough, SOB, and increased urinary output. Time improves his symptoms but he has been unable to use his nebulizer secondary to his pain. He denies any fever, chills, left sided chest pain, or increased LE swelling. He has chronic BLE edema. No urinary symptoms other than frequency. No GI symptoms. Does not have a port.       In the emergency department VSS, afebrile, stable on 3L Pertinent labs:  UA negative,  BNP 74,  BMP consistent with CKD stage III, CBC unremarkable EKG: Independently reviewed. SR,  1st degree AV block, no acute changes Imaging: independently reviewed. CXR reveals small right pleural effusion and COPD  Review of Systems:  Positive for right sided pleuritic chest pain, SOB, a cough, and increased urinary output Negative for fever, visual changes, sore throat, rash, new muscle aches, dysuria, bleeding, n/v/abdominal pain.  Past Medical History  Diagnosis Date  . COPD (chronic obstructive pulmonary disease)   . Hypertension   . Diabetes mellitus without complication   . Chronic kidney disease   . NHL (non-Hodgkin's lymphoma)     Past Surgical History  Procedure Laterality Date  . Replacement total knee      right  . Appendectomy      Social History:  reports that he quit smoking about 13 years ago. He does not have any smokeless tobacco history on file. He reports that he does not drink alcohol or use illicit drugs. lives with their spouse Self-care  No Known Allergies  Family History  Problem Relation Age of Onset  . Diabetes Mother   . Diabetes Father   . Cancer Brother   . Diabetes Brother      Prior to Admission medications   Medication Sig Start Date End Date Taking? Authorizing Provider  acetaminophen (TYLENOL) 500 MG tablet Take 500 mg by mouth every 6 (six) hours as needed for moderate pain.    Historical Provider, MD  albuterol (PROVENTIL HFA;VENTOLIN HFA) 108 (90 BASE) MCG/ACT inhaler Inhale 2 puffs into the lungs every 6 (six) hours as needed for wheezing or shortness of breath.  Historical Provider, MD  allopurinol (ZYLOPRIM) 100 MG tablet Take 1 tablet (100 mg total) by mouth daily. Patient taking differently: Take 150 mg by mouth daily.  12/12/14   Velvet Bathe, MD  amLODipine (NORVASC) 5 MG tablet Take 1 tablet (5 mg total) by mouth daily. 01/08/15   Patrici Ranks, MD  budesonide-formoterol (SYMBICORT) 160-4.5 MCG/ACT inhaler Inhale 2 puffs into the lungs 2 (two) times daily.     Historical Provider, MD  gabapentin (NEURONTIN) 800 MG  tablet Take 400 mg by mouth at bedtime. Take 1/2 tablet (400mg ) every HS.    Historical Provider, MD  HYDROcodone-acetaminophen (NORCO/VICODIN) 5-325 MG per tablet  11/05/14   Historical Provider, MD  insulin glargine (LANTUS) 100 UNIT/ML injection Inject 0.15 mLs (15 Units total) into the skin at bedtime. Patient taking differently: Inject 35 Units into the skin at bedtime.  12/12/14   Velvet Bathe, MD  insulin lispro (HUMALOG) 100 UNIT/ML injection 151-200 = 2 units, 201-250 = 4 units, 251-300 = 6 units, 301 or greater = 8 units & call MD 01/23/15   Patrici Ranks, MD  pravastatin (PRAVACHOL) 40 MG tablet Take 40 mg by mouth every evening.    Historical Provider, MD  predniSONE (DELTASONE) 20 MG tablet Take 3 tablets (60 mg total) by mouth daily with breakfast. Take for 5 days 01/08/15   Patrici Ranks, MD  tiotropium (SPIRIVA) 18 MCG inhalation capsule Place 18 mcg into inhaler and inhale 2 (two) times daily. 2 puffs 2 times daily    Historical Provider, MD  traZODone (DESYREL) 50 MG tablet Take 50 mg by mouth at bedtime.    Historical Provider, MD  zolpidem (AMBIEN) 10 MG tablet Take 10 mg by mouth at bedtime.    Historical Provider, MD   Physical Exam: Filed Vitals:   03/20/15 0600 03/20/15 0707 03/20/15 0803 03/20/15 0840  BP: 156/74 149/69 149/67 150/59  Pulse: 83 88 98 93  Temp:  98.1 F (36.7 C)  98.5 F (36.9 C)  TempSrc:  Oral  Oral  Resp: 23 22 22 22   Height:      Weight:      SpO2: 100% 100% 91% 90%    VSS, afebrile, stable on 3L O2. General:  Appears calm and comfortable, slightly SOB Eyes: PERRL, normal lids, irises & conjunctiva ENT: grossly normal hearing, lips & tongue Neck: no LAD, masses or thyromegaly Cardiovascular: Soft 1/6 systolic murmur LUSB, RRR, no r/g. 2+ pitting edema on BLE.  Respiratory: CTA bilaterally, no w/r/r. Normal respiratory effort. Abdomen: soft, ntnd Skin: no rash or induration noted Musculoskeletal: grossly normal tone  BUE/BLE Psychiatric: grossly normal mood and affect, speech fluent and appropriate Neurologic: grossly non-focal.  Wt Readings from Last 3 Encounters:  03/20/15 95.255 kg (210 lb)  03/12/15 95.437 kg (210 lb 6.4 oz)  02/12/15 93.622 kg (206 lb 6.4 oz)    Labs on Admission:  Basic Metabolic Panel:  Recent Labs Lab 03/20/15 0545  NA 138  K 4.2  CL 95*  CO2 36*  GLUCOSE 168*  BUN 32*  CREATININE 1.63*  CALCIUM 8.9     CBC:  Recent Labs Lab 03/20/15 0545  WBC 5.6  NEUTROABS 3.4  HGB 11.9*  HCT 37.0*  MCV 89.4  PLT 219    Cardiac Enzymes:  Recent Labs Lab 03/20/15 0545  TROPONINI <0.03      Radiological Exams on Admission: Dg Chest Portable 1 View  03/20/2015   CLINICAL DATA:  Shortness of breath  EXAM:  PORTABLE CHEST 1 VIEW  COMPARISON:  12/10/2014 and chest CT 02/10/2015  FINDINGS: Limitation from exclusion of the lateral left costophrenic sulcus.  Small right pleural effusion with streaky right basilar opacity, most consistent with atelectasis. Superimposed scarring also likely based on chest CT 02/10/2015. No cardiomegaly when accounting for a fat pad around the apex seen on previous CT. Background of hyperinflation and bronchitic changes.  IMPRESSION: 1. Small right pleural effusion with atelectasis. 2. Background COPD.   Electronically Signed   By: Monte Fantasia M.D.   On: 03/20/2015 06:06    Principal Problem:   Bacteremia Active Problems:   DM2 (diabetes mellitus, type 2)   Lymphoma, follicular   COPD with acute exacerbation   Chronic respiratory failure with hypoxia   Assessment/Plan 1. GNR Bacteremia, source unclear. No urinary symptoms other than frequency. UA unremarkable. No port. Consider bacterial translocation from gut.  2. Murmur. Chronicity unclear.  3. Acute bronchitis, COPD exacerbation with pleurisy 4. Chronic hypoxic respiratory failure, stable on 3L.  5. CKD stage III, stable 6. Anemia of chronic disease, stable.  7. DM type 2,  AG 7. Random BS 168 8. NHL with last cycle of chemotherapy on 9/21 9. Chronic BLE edema without change   Obs to Tele. Appears stable.   Empiric ceftriaxone and follow up culture data  2D echo to assess murmur  Will give steroids, nebs, and abx.   Code Status: Full   DVT prophylaxis:SCDs Family Communication: Wife at bedside. Discussed with patient who understands and has no concerns at this time. Disposition Plan/Anticipated LOS: Discharge home upon improvement.   Time spent: 55 minutes  Murray Hodgkins, MD  [facility] Pager [phone] 03/20/2015, 8:50 AM    By signing my name below, I, Rosalie Doctor attest that this documentation has been prepared under the direction and in the presence of Murray Hodgkins, MD Electronically signed: Rosalie Doctor, Scribe.  03/20/2015 9:04am  I personally performed the services described in this documentation. All medical record entries made by the scribe were at my direction. I have reviewed the chart and agree that the record reflects my personal performance and is accurate and complete. Murray Hodgkins, MD

## 2015-03-20 NOTE — ED Notes (Signed)
Lab at bedside drawing blood cultures 

## 2015-03-20 NOTE — ED Provider Notes (Signed)
CSN: [id number]     Arrival date & time 03/20/15  0509 History   First MD Initiated Contact with Patient 03/20/15 [phone]     Chief Complaint  Patient presents with  . Abnormal Lab     (Consider location/radiation/quality/duration/timing/severity/associated sxs/prior Treatment) HPI patient has stage II non-Hodgkin's lymphoma currently getting chemotherapy. His last chemotherapy was September 21. He reports that 8 days ago he went to the [facility] in [place] to get some pulmonary studies done however he was having right-sided pleuritic chest pain and he was sent across the street to [facility] emergency department. He states he had x-rays and possibly a nuclear medicine study. He states they ruled out a blood clot or pneumonia. He was placed on Tussionex for his cough. He reports he continues to have the pain. He denies fever or cough however he does have increasing shortness of breath this week. He denies fever or chills. He denies nausea, vomiting, or diarrhea. He reports inability to use his inhaler because it hurts to breathe deep. He uses his nebulizer with mild improvement of his shortness of breath. He reports he was called at 3 AM this morning and told he had an infection in his blood and was told to come to the emergency department.   PCP [facility] in [place]  Past Medical History  Diagnosis Date  . COPD (chronic obstructive pulmonary disease)   . Hypertension   . Diabetes mellitus without complication   . Chronic kidney disease    Past Surgical History  Procedure Laterality Date  . Replacement total knee      right  . Appendectomy     Family History  Problem Relation Age of Onset  . Diabetes Mother   . Diabetes Father   . Cancer Brother   . Diabetes Brother    Social History  Substance Use Topics  . Smoking status: Former Smoker    Quit date: 09/07/2001  . Smokeless tobacco: None  . Alcohol Use: No   lives at home Lives with spouse Uses  oxygen 3 Lpm  nasal cannula  Review of Systems  All other systems reviewed and are negative.     Allergies  Review of patient's allergies indicates no known allergies.  Home Medications   Prior to Admission medications   Medication Sig Start Date End Date Taking? Authorizing Provider  acetaminophen (TYLENOL) 500 MG tablet Take 500 mg by mouth every 6 (six) hours as needed for moderate pain.    Historical Provider, MD  albuterol (PROVENTIL HFA;VENTOLIN HFA) 108 (90 BASE) MCG/ACT inhaler Inhale 2 puffs into the lungs every 6 (six) hours as needed for wheezing or shortness of breath.    Historical Provider, MD  allopurinol (ZYLOPRIM) 100 MG tablet Take 1 tablet (100 mg total) by mouth daily. Patient taking differently: Take 150 mg by mouth daily.  12/12/14   Velvet Bathe, MD  amLODipine (NORVASC) 5 MG tablet Take 1 tablet (5 mg total) by mouth daily. 01/08/15   Patrici Ranks, MD  budesonide-formoterol (SYMBICORT) 160-4.5 MCG/ACT inhaler Inhale 2 puffs into the lungs 2 (two) times daily.     Historical Provider, MD  gabapentin (NEURONTIN) 800 MG tablet Take 400 mg by mouth at bedtime. Take 1/2 tablet (400mg ) every HS.    Historical Provider, MD  HYDROcodone-acetaminophen (NORCO/VICODIN) 5-325 MG per tablet  11/05/14   Historical Provider, MD  insulin glargine (LANTUS) 100 UNIT/ML injection Inject 0.15 mLs (15 Units total) into the skin at bedtime. Patient  taking differently: Inject 35 Units into the skin at bedtime.  12/12/14   Velvet Bathe, MD  insulin lispro (HUMALOG) 100 UNIT/ML injection 151-200 = 2 units, 201-250 = 4 units, 251-300 = 6 units, 301 or greater = 8 units & call MD 01/23/15   Patrici Ranks, MD  pravastatin (PRAVACHOL) 40 MG tablet Take 40 mg by mouth every evening.    Historical Provider, MD  predniSONE (DELTASONE) 20 MG tablet Take 3 tablets (60 mg total) by mouth daily with breakfast. Take for 5 days 01/08/15   Patrici Ranks, MD  tiotropium (SPIRIVA) 18 MCG inhalation  capsule Place 18 mcg into inhaler and inhale 2 (two) times daily. 2 puffs 2 times daily    Historical Provider, MD  traZODone (DESYREL) 50 MG tablet Take 50 mg by mouth at bedtime.    Historical Provider, MD  zolpidem (AMBIEN) 10 MG tablet Take 10 mg by mouth at bedtime.    Historical Provider, MD   BP 159/70 mmHg  Pulse 93  Temp(Src) 97.8 F (36.6 C) (Oral)  Resp 20  Ht 5\' 8"  (1.727 m)  Wt 210 lb (95.255 kg)  BMI 31.94 kg/m2  SpO2 94%  Vital signs normal except hypertension  Physical Exam  Constitutional: He is oriented to person, place, and time. He appears well-developed and well-nourished.  Non-toxic appearance. He does not appear ill. No distress.  HENT:  Head: Normocephalic and atraumatic.  Right Ear: External ear normal.  Left Ear: External ear normal.  Nose: Nose normal. No mucosal edema or rhinorrhea.  Mouth/Throat: Oropharynx is clear and moist and mucous membranes are normal. No dental abscesses or uvula swelling.  Eyes: Conjunctivae and EOM are normal. Pupils are equal, round, and reactive to light.  Neck: Normal range of motion and full passive range of motion without pain. Neck supple.  Cardiovascular: Normal rate, regular rhythm and normal heart sounds.  Exam reveals no gallop and no friction rub.   No murmur heard. Pulmonary/Chest: Accessory muscle usage present. Tachypnea noted. He is in respiratory distress. He has decreased breath sounds. He has no wheezes. He has no rhonchi. He has no rales. He exhibits no tenderness and no crepitus.  Gets short of breath during conversation  Abdominal: Soft. Normal appearance and bowel sounds are normal. He exhibits no distension. There is no tenderness. There is no rebound and no guarding.  Musculoskeletal: Normal range of motion. He exhibits no edema or tenderness.  Patient has edema of his feet and ankles in his lower extremities approximately 1-2+ pitting edema. There is no redness seen there is no open wounds seen. He states  this is stable.  Neurological: He is alert and oriented to person, place, and time. He has normal strength. No cranial nerve deficit.  Skin: Skin is warm, dry and intact. No rash noted. No erythema. No pallor.  Psychiatric: He has a normal mood and affect. His speech is normal and behavior is normal. His mood appears not anxious.  Nursing note and vitals reviewed.   ED Course  Procedures (including critical care time)  Medications  piperacillin-tazobactam (ZOSYN) IVPB 3.375 g (not administered)  albuterol (PROVENTIL,VENTOLIN) solution continuous neb (10 mg/hr Nebulization Given 03/20/15 0541)  ipratropium (ATROVENT) nebulizer solution 0.5 mg (0.5 mg Nebulization Given 03/20/15 0541)  methylPREDNISolone sodium succinate (SOLU-MEDROL) 125 mg/2 mL injection 125 mg (125 mg Intravenous Given 03/20/15 0555)   Patient was given a continuous nebulizer for his shortness of breath. He was also given a dose of IV Solu-Medrol.  After verifying the gram-negative rods in his blood culture he was given Zosyn IV. 2 more sets of blood cultures were drawn. On review of the ED visit yesterday he was not febrile.  6:30 AM.  Pt is sleeping, he appears less short of breath. Due to verified gram-negative rods in at least one blood culture drawn yesterday, we discussed he should be admitted for IV antibiotics until the gram-negative rods are identified and at that point they can decide if he can have outpatient treatment. Although patient's white blood cell count is normal he is currently a chemotherapy patient. He does not have a port. Pt is agreeable to be admitted.   07:20 Dr Sarajane Jews, admit to tele   Records were obtained from patient ED visit on September 28 from [facility] in [place]. He had a chest x-ray showing normal heart size is no focal infiltrate. Mild tenting of the right hemidiaphragm. Spondylosis but no acute abnormality. His troponin was positive at 0.0-4 with upper limits of normal being 0.10.  Patient has a renal insufficiency and CT angiogram of the chest could not be done to rule out PE. Patient  had a nuclear medicine lung scan both ventilation and perfusion showing low probability for pulmonary embolus. His discharge diagnosis was COPD with acute exacerbation and pleurisy. He was prescribed the Tussionex. His blood culture was read at 3:30 this morning as being positive for gram-negative rods, the second blood culture had not been read yet.  Review of patient's chart shows he has non-Hodgkin's lymphoma, low-grade B-cell lymphoma. He was started on chemotherapy on June 22. His course has been complicated by hypercalcemia of malignancy and it was treated with pamidronate on July 1.    Labs Review Results for orders placed or performed during the hospital encounter of 69/79/48  Basic metabolic panel  Result Value Ref Range   Sodium 138 135 - 145 mmol/L   Potassium 4.2 3.5 - 5.1 mmol/L   Chloride 95 (L) 101 - 111 mmol/L   CO2 36 (H) 22 - 32 mmol/L   Glucose, Bld 168 (H) 65 - 99 mg/dL   BUN 32 (H) 6 - 20 mg/dL   Creatinine, Ser 1.63 (H) 0.61 - 1.24 mg/dL   Calcium 8.9 8.9 - 10.3 mg/dL   GFR calc non Af Amer 42 (L) >60 mL/min   GFR calc Af Amer 48 (L) >60 mL/min   Anion gap 7 5 - 15  CBC with Differential  Result Value Ref Range   WBC 5.6 4.0 - 10.5 K/uL   RBC 4.14 (L) 4.22 - 5.81 MIL/uL   Hemoglobin 11.9 (L) 13.0 - 17.0 g/dL   HCT 37.0 (L) 39.0 - 52.0 %   MCV 89.4 78.0 - 100.0 fL   MCH 28.7 26.0 - 34.0 pg   MCHC 32.2 30.0 - 36.0 g/dL   RDW 13.8 11.5 - 15.5 %   Platelets 219 150 - 400 K/uL   Neutrophils Relative % 62 %   Neutro Abs 3.4 1.7 - 7.7 K/uL   Lymphocytes Relative 14 %   Lymphs Abs 0.8 0.7 - 4.0 K/uL   Monocytes Relative 14 %   Monocytes Absolute 0.8 0.1 - 1.0 K/uL   Eosinophils Relative 10 %   Eosinophils Absolute 0.6 0.0 - 0.7 K/uL   Basophils Relative 0 %   Basophils Absolute 0.0 0.0 - 0.1 K/uL  Brain natriuretic peptide  Result Value Ref Range   B  Natriuretic Peptide 74.0 0.0 - 100.0 pg/mL  Troponin I  Result Value Ref Range   Troponin I <0.03 <0.031 ng/mL   Laboratory interpretation all normal except stable renal insufficiency, hyperglycemia     Imaging Review Dg Chest Portable 1 View  03/20/2015   CLINICAL DATA:  Shortness of breath  EXAM: PORTABLE CHEST 1 VIEW  COMPARISON:  12/10/2014 and chest CT 02/10/2015  FINDINGS: Limitation from exclusion of the lateral left costophrenic sulcus.  Small right pleural effusion with streaky right basilar opacity, most consistent with atelectasis. Superimposed scarring also likely based on chest CT 02/10/2015. No cardiomegaly when accounting for a fat pad around the apex seen on previous CT. Background of hyperinflation and bronchitic changes.  IMPRESSION: 1. Small right pleural effusion with atelectasis. 2. Background COPD.   Electronically Signed   By: Monte Fantasia M.D.   On: 03/20/2015 06:06   I have personally reviewed and evaluated these images and lab results as part of my medical decision-making.   EKG Interpretation   Date/Time:  Thursday March 20 2015 05:38:22 EDT Ventricular Rate:  83 PR Interval:  239 QRS Duration: 94 QT Interval:  363 QTC Calculation: 426 R Axis:   -41 Text Interpretation:  Sinus rhythm Prolonged PR interval Left axis  deviation Right atrial enlargement Poor R wave progression No significant  change since last tracing 03 December 2014 Confirmed by [facility]  MD-I, IVA  ([id number]) on 03/20/2015 5:44:56 AM      MDM   Final diagnoses:  Bacteremia  Shortness of breath  Pleuritic chest pain    Plan admission   Rolland Porter, MD, [place] Performed by: Rolland Porter L Total critical care time: 35 min Critical care time was exclusive of separately billable procedures and treating other patients. Critical care was necessary to treat or prevent imminent or life-threatening deterioration. Critical care was time spent personally by me on the following  activities: development of treatment plan with patient and/or surrogate as well as nursing, discussions with consultants, evaluation of patient's response to treatment, examination of patient, obtaining history from patient or surrogate, ordering and performing treatments and interventions, ordering and review of laboratory studies, ordering and review of radiographic studies, pulse oximetry and re-evaluation of patient's condition.   Rolland Porter, MD 03/20/15 [phone]

## 2015-03-21 DIAGNOSIS — Z833 Family history of diabetes mellitus: Secondary | ICD-10-CM | POA: Diagnosis not present

## 2015-03-21 DIAGNOSIS — E669 Obesity, unspecified: Secondary | ICD-10-CM | POA: Diagnosis present

## 2015-03-21 DIAGNOSIS — N183 Chronic kidney disease, stage 3 (moderate): Secondary | ICD-10-CM | POA: Diagnosis present

## 2015-03-21 DIAGNOSIS — C8290 Follicular lymphoma, unspecified, unspecified site: Secondary | ICD-10-CM | POA: Diagnosis present

## 2015-03-21 DIAGNOSIS — Z809 Family history of malignant neoplasm, unspecified: Secondary | ICD-10-CM | POA: Diagnosis not present

## 2015-03-21 DIAGNOSIS — I129 Hypertensive chronic kidney disease with stage 1 through stage 4 chronic kidney disease, or unspecified chronic kidney disease: Secondary | ICD-10-CM | POA: Diagnosis present

## 2015-03-21 DIAGNOSIS — J9611 Chronic respiratory failure with hypoxia: Secondary | ICD-10-CM | POA: Diagnosis present

## 2015-03-21 DIAGNOSIS — R0602 Shortness of breath: Secondary | ICD-10-CM | POA: Diagnosis present

## 2015-03-21 DIAGNOSIS — J441 Chronic obstructive pulmonary disease with (acute) exacerbation: Secondary | ICD-10-CM | POA: Diagnosis present

## 2015-03-21 DIAGNOSIS — R7881 Bacteremia: Secondary | ICD-10-CM | POA: Diagnosis present

## 2015-03-21 DIAGNOSIS — Z87891 Personal history of nicotine dependence: Secondary | ICD-10-CM | POA: Diagnosis not present

## 2015-03-21 DIAGNOSIS — E1122 Type 2 diabetes mellitus with diabetic chronic kidney disease: Secondary | ICD-10-CM | POA: Diagnosis present

## 2015-03-21 DIAGNOSIS — D638 Anemia in other chronic diseases classified elsewhere: Secondary | ICD-10-CM | POA: Diagnosis present

## 2015-03-21 DIAGNOSIS — Z9221 Personal history of antineoplastic chemotherapy: Secondary | ICD-10-CM | POA: Diagnosis not present

## 2015-03-21 DIAGNOSIS — B961 Klebsiella pneumoniae [K. pneumoniae] as the cause of diseases classified elsewhere: Secondary | ICD-10-CM | POA: Diagnosis present

## 2015-03-21 LAB — GLUCOSE, CAPILLARY
GLUCOSE-CAPILLARY: 273 mg/dL — AB (ref 65–99)
Glucose-Capillary: 189 mg/dL — ABNORMAL HIGH (ref 65–99)
Glucose-Capillary: 307 mg/dL — ABNORMAL HIGH (ref 65–99)
Glucose-Capillary: 250 mg/dL — ABNORMAL HIGH (ref 65–99)

## 2015-03-21 LAB — URINE CULTURE: Culture: 9000

## 2015-03-21 MED ORDER — PRO-STAT SUGAR FREE PO LIQD
30.0000 mL | Freq: Every day | ORAL | Status: DC
  Administered 2015-03-21: 30 mL via ORAL
  Filled 2015-03-21 (×2): qty 30

## 2015-03-21 MED ORDER — PREDNISONE 20 MG PO TABS
40.0000 mg | ORAL_TABLET | Freq: Every day | ORAL | Status: DC
  Administered 2015-03-22: 40 mg via ORAL
  Filled 2015-03-21: qty 2

## 2015-03-21 MED ORDER — GLUCERNA SHAKE PO LIQD
237.0000 mL | Freq: Every morning | ORAL | Status: DC
  Administered 2015-03-21 – 2015-03-22 (×2): 237 mL via ORAL

## 2015-03-21 NOTE — Care Management Note (Signed)
Case Management Note  Patient Details  Name: Billy Gray MRN: [id number] Date of Birth: November 28, 1945  Expected Discharge Date:   03/21/2015               Expected Discharge Plan:  Home/Self Care  In-House Referral:  NA  Discharge planning Services  CM Consult  Post Acute Care Choice:  NA Choice offered to:  NA  DME Arranged:    DME Agency:     HH Arranged:    [place] Agency:     Status of Service:  Completed, signed off  Medicare Important Message Given:    Date Medicare IM Given:    Medicare IM give by:    Date Additional Medicare IM Given:    Additional Medicare Important Message give by:     If discussed at [place] of Stay Meetings, dates discussed:    Additional Comments: Pt admitted with bactermia. Pt is from home, ind at baseline. [facility] where he see's Dr. Herby Abraham. Pt see's Dr. Whitney Muse for onc needs. TC at [facility] called and notified of admission. CM will fax pt info and DC summary if requested. Pt has requested [facility] provide [facility]. Pt has home O2, neb machine and CPAP through [facility]. Pt has no HH services prior to admission and no HH needs anticipated at DC. Pt plans to return home with self care at DC, anticipate DC over weekend. CM will communicate with [facility] to provide pt info, no other CM needs identified.  Sherald Barge, RN 03/21/2015, 10:52 AM

## 2015-03-21 NOTE — Progress Notes (Signed)
PROGRESS NOTE  Billy Gray WGN:[id number] DOB: 1946-04-14 DOA: 03/20/2015 PCP: Pcp Not In System  Summary: 69 yo male with PMH stage II non-Hodgkin's lymphoma, currently undergoing chemotherapy (last performed 9/21 with Rituxan and Cytoxan) presented with abnormal labs and SOB. He was seen at [facility] for right-sided chest pain shortness of breath. VQ scan was low probability and other blood work was unrevealing. Blood cultures were obtained which subsequently were positive for 1/2 bottles gram-negative rods. He was therefore instructed to return but preferred to come to the emergency department any plan. Main symptoms were right-sided chest pain, pleuritic with shortness of breath.  Assessment/Plan: 1. GNR bacteremia, source unclear. Afebrile, no other suggesting symptoms. UA unremarkable. No port. Consider bacterial translocation from gut.BC/UC pending. No abdominal symptoms. 2. Acute bronchitis, COPD exacerbation pleurisy. Resolved. 3. Murmur. Chronicity unclear. Echocardiogram unremarkable 4. Chronic hypoxic respiratory failure, stable on 3L.  5. CKD stage III, stable 6. Anemia of chronic disease, stable.  7. DM type 2, AG 7. High. Expect improvement with decrease steroids. 8. NHL with last cycle of chemotherapy on 9/21 9. Chronic BLE edema without change.     Overall improving.  Continue on supplemental O2  Continue empiric ceftriaxone and follow up culture data  Change to oral steroids.  Await results of blood cultures, then will change to oral therapy and plan discharge home. He has no localizing symptoms and recent LFTs were unremarkable.   Code Status: Full DVT prophylaxis: SCDs Family discussion: Dicussed plan in detail with patient and wife. No further concerns at this time. Disposition Plan: Anticipated discharge within 1-2 days  Murray Hodgkins, MD  [facility]  Pager [phone] If 7PM-7AM, please contact night-coverage at [email or web address],  password [email or web address] 03/21/2015, 7:22 AM  LOS: 1 day   Consultants:    Procedures:  ECHO - Left ventricle: The cavity size was normal. Wall thickness was increased in a pattern of mild LVH. Systolic function was vigorous. The estimated ejection fraction was in the range of 65% to 70%. Doppler parameters are consistent with abnormal left ventricular relaxation (grade 1 diastolic dysfunction). - Aortic valve: Mildly calcified annulus. Normal thickness leaflets. Valve area (VTI): 3.39 cm^2. Valve area (Vmax): 3.12 cm^2. - Mitral valve: Mildly calcified annulus. Normal thickness leaflets - Technically difficult study.  Antibiotics:    HPI/Subjective: Feeling better. No abdominal pain, nausea or vomiting.  No chest pain. Breathing fine. Tolerating solid diet.  Objective: Filed Vitals:   03/20/15 2254 03/20/15 2301 03/21/15 0114 03/21/15 0645  BP:    123/65  Pulse:    85  Temp:    98 F (36.7 C)  TempSrc:    Oral  Resp:    20  Height:      Weight:      SpO2: 98% 100% 98% 97%    Intake/Output Summary (Last 24 hours) at 03/21/15 0722 Last data filed at 03/21/15 0644  Gross per 24 hour  Intake 429.83 ml  Output      0 ml  Net 429.83 ml     Filed Weights   03/20/15 0520  Weight: 95.255 kg (210 lb)    Exam: Afebrile, not hypoxic General:  Appears calm and comfortable Cardiovascular: RRR, no m/r/g. 2+ pitting edema on BLE .  Telemetry SR Respiratory: CTA bilaterally, no w/r/r. Normal respiratory effort. Abdomen: soft, ntnd, no RUQ pain Psychiatric: grossly normal mood and affect, speech fluent and appropriate  New data reviewed:  CBG high  Repeat BC NGTD  Pertinent data since admission:  CXR  1. Small right pleural effusion with atelectasis. 2. Background COPD.  ECHO - Left ventricle: The cavity size was normal. Wall thickness was increased in a pattern of mild LVH. Systolic function was vigorous. The estimated ejection fraction was in the  range of 65% to 70%. Doppler parameters are consistent with abnormal left ventricular relaxation (grade 1 diastolic dysfunction). - Aortic valve: Mildly calcified annulus. Normal thickness leaflets. Valve area (VTI): 3.39 cm^2. Valve area (Vmax): 3.12 cm^2. - Mitral valve: Mildly calcified annulus. Normal thickness leaflets - Technically difficult study.  Pending data:  UC/BC  Scheduled Meds: . allopurinol  150 mg Oral Daily  . amLODipine  5 mg Oral Daily  . antiseptic oral rinse  7 mL Mouth Rinse BID  . aspirin EC  81 mg Oral Daily  . budesonide-formoterol  2 puff Inhalation BID  . cefTRIAXone (ROCEPHIN)  IV  2 g Intravenous Q24H  . enoxaparin (LOVENOX) injection  40 mg Subcutaneous Q24H  . Influenza vac split quadrivalent PF  0.5 mL Intramuscular Tomorrow-1000  . insulin aspart  0-20 Units Subcutaneous TID WC  . insulin aspart  0-5 Units Subcutaneous QHS  . insulin glargine  35 Units Subcutaneous QHS  . ipratropium-albuterol  3 mL Nebulization Q4H  . methylPREDNISolone (SOLU-MEDROL) injection  40 mg Intravenous Q12H  . pravastatin  40 mg Oral QPM  . sodium chloride  3 mL Intravenous Q12H  . sodium chloride  3 mL Intravenous Q12H  . traZODone  50 mg Oral QHS   Continuous Infusions:   Principal Problem:   Bacteremia Active Problems:   DM2 (diabetes mellitus, type 2)   Lymphoma, follicular   COPD with acute exacerbation   Chronic respiratory failure with hypoxia   Time spent 20 minutes  By signing my name below, I, Rhett Bannister attest that this documentation has been prepared under the direction and in the presence of Murray Hodgkins, MD   Electronically signed: Rhett Bannister  03/21/2015 1:32 PM  I personally performed the services described in this documentation. All medical record entries made by the scribe were at my direction. I have reviewed the chart and agree that the record reflects my personal performance and is accurate and complete. Murray Hodgkins, MD

## 2015-03-21 NOTE — Progress Notes (Signed)
Inpatient Diabetes Program Recommendations  AACE/ADA: New Consensus Statement on Inpatient Glycemic Control (2015)  Target Ranges:  Prepandial:   less than 140 mg/dL      Peak postprandial:   less than 180 mg/dL (1-2 hours)      Critically ill patients:  140 - 180 mg/dL  Results for MEGAN, HAYDUK (MRN [id number]) as of 03/21/2015 08:36  Ref. Range 03/20/2015 17:13 03/20/2015 21:42 03/21/2015 07:42  Glucose-Capillary Latest Ref Range: 65-99 mg/dL 420 (H) 306 (H) 189 (H)   Review of Glycemic Control  Current orders for Inpatient glycemic control: Lantus 35 units HS, Novolog 0-20 units TID with meals, Novolog 0-5 units HS  Inpatient Diabetes Program Recommendations: Insulin - Meal Coverage: If steroids are continued, please consider ordering Novolog 4 units TID with meals for meal coverage.  Thanks, Barnie Alderman, RN, MSN, CCRN, CDE Diabetes Coordinator Inpatient Diabetes Program [phone] (Team Pager from [place]) [phone] ([facility] office) [phone] [facility] office) [phone] [facility] office)

## 2015-03-21 NOTE — Progress Notes (Signed)
Checked in on patient to see how he was doing on CPAP, pt sitting on the side of bed, states that he is not able to go to sleep and that he prefer to go back on his cannula.. Pt gave a very good effort trying to wear machine..wore for 2.5 hours not able to rest. Cannula at 4lpm back on.Marland KitchenMarland Kitchen

## 2015-03-21 NOTE — Discharge Instructions (Signed)

## 2015-03-21 NOTE — Progress Notes (Signed)
Initial Nutrition Assessment  DOCUMENTATION CODES:   Obesity unspecified  INTERVENTION:  - Order Glucerna once daily (each supplement contains 220 kcal and 10 g protein).  - Order multivitamin. - Order Prostat 30 mL once daily. (Each supplement contains 100 kcal and 15 g protein).    NUTRITION DIAGNOSIS:   Inadequate oral intake related to cancer and cancer related treatments, other (see comment) (shortness of breath) as evidenced by mild depletion of muscle mass, percent weight loss.   GOAL:   Patient will meet greater than or equal to 90% of their needs    MONITOR:   PO intake, Supplement acceptance, Labs, Weight trends, Skin  REASON FOR ASSESSMENT:   Malnutrition Screening Tool    ASSESSMENT:   69 y.o. male with SOB, abnormal labs, R.sided pleuritic chest pain and increased uring output. Dx of bacteremia. Patient is currently undergoing chemotherapy for non hodgkins lymphomia. Additional prior med hx of COPD, CKD stage III, DM.   Patient alert and sitting up right with spouse present in room during visit for MST. BMI is 32, obese.   Patient's usual diet includes a variety of meats - salmon patties, beef with gravy, biscuits, vegetable stew, cereal, pancakes, ham & cheese sandwiches, spaghetti, salads. Snacks include: sugar free jello, peanut butter, peaches, fruit cup. Patient lives at home with spouse who cooks most of the meals.   Patient's appetite has been fluctuating since treatment began for cancer (July 2016). He had stopped eating before the treatment started. Since then, he occasionally has aroma related sensitivities. RD student talked with family about cold foods during times when aroma is causing poor appetite.   Patient is not currently taking a multivitamin or any supplements at home. He is interested to trying while he is here. Will order Glucerna for patient. Patient's labs are currently elevated for CBG which he attributed to steroid and chemotherapy.  Patient is actively monitoring sodium and sugar levels in foods and trying to make sugar free selections.   Patient has described recent weight loss from usual weight (220 lbs) to current weight, 210 lbs. Patient has +3 pitting edema in lower extremities, which may affect current weight. Per chart, patient has lost 5% / 3 months. Results from the [facility] indicated that patient has mild muscle mass. He described noticing weakness in upper arms and the edema in his legs as recent changes. Patient is currently not active due to SOB. RD student educated on heart healthy high protein foods and encouraged intake to help minimize msucle loss.   Labs: CBG 180-420, Cl 95, CO2 36, BUN 32, Cr 1.63, RBC 4.14  Medications: Lantus, Humalog, Novolog, Prednisolone  Diet Order:  Diet heart healthy/carb modified Room service appropriate?: Yes; Fluid consistency:: Thin  Skin:  Reviewed, no issues  Last BM:  03/20/2015  Height:   Ht Readings from Last 1 Encounters:  03/20/15 5\' 8"  (1.727 m)    Weight:   Wt Readings from Last 1 Encounters:  03/20/15 210 lb (95.255 kg)    Ideal Body Weight:  70 kg  BMI:  Body mass index is 31.94 kg/(m^2).  Estimated Nutritional Needs:   Kcal:  1900-2100 kcal  Protein:  95-105 g  Fluid:  2 L/day  EDUCATION NEEDS:   Education needs addressed  Kayleen Memos, BA. BS Dietetic Intern 03/21/2015 1:28 PM

## 2015-03-21 NOTE — Clinical Documentation Improvement (Signed)
Hospitalist  Please clarify respiratory status upon admission in progress notes and discharge summary:   Acute Respiratory Failure  Acute on Chronic Respiratory Failure  Other  Clinically Undetermined  Supporting Information:  History of Chronic respiratory failure & COPD Has Acute bronchitis, COPD with pleurisy  Per ED provider note: Pulmonary/Chest: Accessory muscle usage present. Tachypnea noted. He is in respiratory distress. He has decreased breath sounds. He has no wheezes. He has no rhonchi. He has no rales. He exhibits no tenderness and no crepitus.  Gets short of breath during conversation  Patient was given a continuous nebulizer for his shortness of breath. He was also given a dose of IV Solu-Medrol.  ED Nurses Note:  Pt's o2 sat decreased to 89% on 3liters while eating breakfast. Increased to 3.5liters and sat stayed around 89%. Increased to 4liters and 02 sat 93%. Will notify receiving RN.        Please exercise your independent, professional judgment when responding. A specific answer is not anticipated or expected.   Thank You, [place] [phone]

## 2015-03-22 DIAGNOSIS — N183 Chronic kidney disease, stage 3 (moderate): Secondary | ICD-10-CM

## 2015-03-22 LAB — COMPREHENSIVE METABOLIC PANEL
ALK PHOS: 46 U/L (ref 38–126)
ALT: 14 U/L — ABNORMAL LOW (ref 17–63)
ANION GAP: 6 (ref 5–15)
AST: 18 U/L (ref 15–41)
Albumin: 2.7 g/dL — ABNORMAL LOW (ref 3.5–5.0)
BUN: 40 mg/dL — ABNORMAL HIGH (ref 6–20)
CALCIUM: 8.2 mg/dL — AB (ref 8.9–10.3)
CHLORIDE: 93 mmol/L — AB (ref 101–111)
CO2: 37 mmol/L — AB (ref 22–32)
Creatinine, Ser: 1.48 mg/dL — ABNORMAL HIGH (ref 0.61–1.24)
GFR calc Af Amer: 54 mL/min — ABNORMAL LOW (ref >60)
GFR calc non Af Amer: 47 mL/min — ABNORMAL LOW (ref >60)
Glucose, Bld: 173 mg/dL — ABNORMAL HIGH (ref 65–99)
POTASSIUM: 4.5 mmol/L (ref 3.5–5.1)
SODIUM: 136 mmol/L (ref 135–145)
Total Bilirubin: 0.4 mg/dL (ref 0.3–1.2)
Total Protein: 5.7 g/dL — ABNORMAL LOW (ref 6.5–8.1)

## 2015-03-22 LAB — GLUCOSE, CAPILLARY
GLUCOSE-CAPILLARY: 339 mg/dL — AB (ref 65–99)
Glucose-Capillary: 147 mg/dL — ABNORMAL HIGH (ref 65–99)

## 2015-03-22 MED ORDER — AMOXICILLIN-POT CLAVULANATE 875-125 MG PO TABS: 1.0000 | ORAL_TABLET | Freq: Two times a day (BID) | ORAL | Status: DC

## 2015-03-22 MED ORDER — PREDNISONE 10 MG PO TABS: ORAL_TABLET | ORAL | Status: DC

## 2015-03-22 MED ORDER — INSULIN GLARGINE 100 UNIT/ML ~~LOC~~ SOLN: 35.0000 [IU] | Freq: Every day | SUBCUTANEOUS | Status: DC

## 2015-03-22 NOTE — Discharge Summary (Signed)
Physician Discharge Summary  Billy Gray JJH:[id number] DOB: 1946-06-13 DOA: 03/20/2015  PCP: Pcp Not In System [facility] date: 03/20/2015 Discharge date: 03/22/2015  Recommendations for Outpatient Follow-up:  1. Follow up with PCP in 1-2 weeks for resolution of bacteremia. Follow up BC from [facility].  2. Continue oral abx for two weeks, per ID.   Discharge Diagnoses:  1. Klebsiella bacteremia  2. Acute bronchitis, COPD exacerbation, pleurisy. 3. Chronic hypoxic respiratory failure. 4. CKD stage III. 5. Anemia of chronic disease. 6. DM type 2. 7. NHL with last cycle of chemotherapy on 9/21 8. Chronic BLE edema. 9. Obesity  Discharge Condition: Improved Disposition: Home  Diet recommendation: heart healthy/carb modified  Filed Weights   03/20/15 0520  Weight: 95.255 kg (210 lb)    History of present illness:  69 yo male with PMH stage II non-Hodgkin's lymphoma, currently undergoing chemotherapy (last performed 9/21 with Rituxan and Cytoxan) presented with abnormal labs and SOB. He was seen at [facility] for right-sided chest pain and shortness of breath. VQ scan was low probability and other blood work was unrevealing. Blood cultures were obtained which subsequently were positive for 1/2 bottles gram-negative rods. He was therefore instructed to return but preferred to come to the emergency department at [facility]. Main symptoms were right-sided chest pain, pleuritic with shortness of breath.  Hospital Course:  Repeat blood cultures no growth today. No fever, systemic symptoms, signs or localizing findings. No abdominal pain, no nausea or vomiting. Pleurisy resolved with treatment of COPD exacerbation. Final results from 9/28 blood cultures 1/2 at [facility] revealed Klebsiella, susceptible to multiple antibiotics. Repeat BC show no growth to date however are still pending. Acute bronchitis, pleurisy, and COPD exacerbation resolved with steroids, nebs,  and bronchodilators. .   Individual issues as below:  1. Klebsiella bacteremia, source unclear. Culture from [facility] revealed Klebsiella, susceptible to multiple abx including Augmentin. Afebrile, WBC wnl, no other suggesting symptoms. UA and UC unremarkable. No port. Consider bacterial translocation from gut. No abdominal symptoms. Repeat BC from [facility] pending but show no growth to date.  2. Acute bronchitis, COPD exacerbation, pleurisy. Resolved. 3. Murmur. Chronicity unclear. Echocardiogram unremarkable 4. Chronic hypoxic respiratory failure, stable on 3L.  5. CKD stage III, stable 6. Anemia of chronic disease, stable.  7. DM type 2, AG 6. Glucose 173. Expect improvement with decreased steroids. 8. NHL with last cycle of chemotherapy on 9/21 9. Chronic BLE edema without change. 10. Obesity  Ddiscussed with ID Dr. Tommy Medal, could be contaminant but recommends 2 weeks total treatment with oral abx.  Consultants:  Nutrition  Procedures:  Echo - Left ventricle: The cavity size was normal. Wall thickness was increased in a pattern of mild LVH. Systolic function was vigorous. The estimated ejection fraction was in the range of 65% to 70%. Doppler parameters are consistent with abnormal left ventricular relaxation (grade 1 diastolic dysfunction). - Aortic valve: Mildly calcified annulus. Normal thickness leaflets. Valve area (VTI): 3.39 cm^2. Valve area (Vmax): 3.12 cm^2. - Mitral valve: Mildly calcified annulus. Normal thickness leaflets - Technically difficult study.  Antibiotics:  Rocephin 9/29>>9/30  Augmentin 10/1>>10/12  Discharge Instructions Discharge Instructions    Activity as tolerated - No restrictions    Complete by:  As directed      Diet - low sodium heart healthy    Complete by:  As directed      Diet Carb Modified    Complete by:  As directed  Discharge instructions    Complete by:  As directed   Call your physician or seek  immediate medical attention for pain, fever, vomiting or worsening of condition.            Discharge Medication List as of 03/22/2015 12:15 PM    START taking these medications   Details  amoxicillin-clavulanate (AUGMENTIN) 875-125 MG tablet Take 1 tablet by mouth 2 (two) times daily., Starting 03/22/2015, Until Discontinued, Normal    predniSONE (DELTASONE) 10 MG tablet Take 20 mg by mouth daily for 3 days, then take 10 mg by mouth daily for 3 days, then stop., Normal      CONTINUE these medications which have CHANGED   Details  insulin glargine (LANTUS) 100 UNIT/ML injection Inject 0.35 mLs (35 Units total) into the skin at bedtime., Starting 03/22/2015, Until Discontinued, No Print      CONTINUE these medications which have NOT CHANGED   Details  acetaminophen (TYLENOL) 500 MG tablet Take 500 mg by mouth every 6 (six) hours as needed for moderate pain., Until Discontinued, Historical Med    albuterol (PROVENTIL HFA;VENTOLIN HFA) 108 (90 BASE) MCG/ACT inhaler Inhale 2 puffs into the lungs every 6 (six) hours as needed for wheezing or shortness of breath., Until Discontinued, Historical Med    allopurinol (ZYLOPRIM) 100 MG tablet Take 1 tablet (100 mg total) by mouth daily., Starting 12/12/2014, Until Discontinued, Print    amLODipine (NORVASC) 5 MG tablet Take 1 tablet (5 mg total) by mouth daily., Starting 01/08/2015, Until Discontinued, Normal    budesonide-formoterol (SYMBICORT) 160-4.5 MCG/ACT inhaler Inhale 2 puffs into the lungs 2 (two) times daily. , Until Discontinued, Historical Med    insulin lispro (HUMALOG) 100 UNIT/ML injection 151-200 = 2 units, 201-250 = 4 units, 251-300 = 6 units, 301 or greater = 8 units & call MD, Normal    pravastatin (PRAVACHOL) 40 MG tablet Take 40 mg by mouth every evening., Until Discontinued, Historical Med    tiotropium (SPIRIVA) 18 MCG inhalation capsule Place 18 mcg into inhaler and inhale 2 (two) times daily. 2 puffs 2 times daily, Until  Discontinued, Historical Med    traZODone (DESYREL) 50 MG tablet Take 50 mg by mouth at bedtime., Until Discontinued, Historical Med    zolpidem (AMBIEN) 10 MG tablet Take 10 mg by mouth at bedtime., Until Discontinued, Historical Med       No Known Allergies  The results of significant diagnostics from this hospitalization (including imaging, microbiology, ancillary and laboratory) are listed below for reference.    Significant Diagnostic Studies: Dg Chest Portable 1 View  03/20/2015   CLINICAL DATA:  Shortness of breath  EXAM: PORTABLE CHEST 1 VIEW  COMPARISON:  12/10/2014 and chest CT 02/10/2015  FINDINGS: Limitation from exclusion of the lateral left costophrenic sulcus.  Small right pleural effusion with streaky right basilar opacity, most consistent with atelectasis. Superimposed scarring also likely based on chest CT 02/10/2015. No cardiomegaly when accounting for a fat pad around the apex seen on previous CT. Background of hyperinflation and bronchitic changes.  IMPRESSION: 1. Small right pleural effusion with atelectasis. 2. Background COPD.   Electronically Signed   By: Monte Fantasia M.D.   On: 03/20/2015 06:06    Microbiology: Recent Results (from the past 240 hour(s))  Urine culture     Status: None   Collection Time: 03/20/15  6:44 AM  Result Value Ref Range Status   Specimen Description URINE, CLEAN CATCH  Final   Special Requests NONE  Final   Culture   Final    9,000 COLONIES/mL INSIGNIFICANT GROWTH Performed at [facility]    Report Status 03/21/2015 FINAL  Final  Culture, blood (routine x 2)     Status: None (Preliminary result)   Collection Time: 03/20/15  6:55 AM  Result Value Ref Range Status   Specimen Description BLOOD RIGHT ARM  Final   Special Requests   Final    BOTTLES DRAWN AEROBIC AND ANAEROBIC 5CC  IMMUNE:COMPROMISED   Culture NO GROWTH 2 DAYS  Final   Report Status PENDING  Incomplete  Culture, blood (routine x 2)     Status: None  (Preliminary result)   Collection Time: 03/20/15  7:00 AM  Result Value Ref Range Status   Specimen Description BLOOD LEFT ANTECUBITAL  Final   Special Requests   Final    BOTTLES DRAWN AEROBIC AND ANAEROBIC 6CC  IMMUNE:COMPROMISED   Culture NO GROWTH 2 DAYS  Final   Report Status PENDING  Incomplete     Labs: Basic Metabolic Panel:  Recent Labs Lab 03/20/15 0545 03/22/15 0608  NA 138 136  K 4.2 4.5  CL 95* 93*  CO2 36* 37*  GLUCOSE 168* 173*  BUN 32* 40*  CREATININE 1.63* 1.48*  CALCIUM 8.9 8.2*   Liver Function Tests:  Recent Labs Lab 03/22/15 0608  AST 18  ALT 14*  ALKPHOS 46  BILITOT 0.4  PROT 5.7*  ALBUMIN 2.7*    CBC:  Recent Labs Lab 03/20/15 0545  WBC 5.6  NEUTROABS 3.4  HGB 11.9*  HCT 37.0*  MCV 89.4  PLT 219   Cardiac Enzymes:  Recent Labs Lab 03/20/15 0545  TROPONINI <0.03       Recent Labs  12/03/14 1631 03/20/15 0545  BNP 102.8* 74.0     CBG:  Recent Labs Lab 03/21/15 1126 03/21/15 1655 03/21/15 2234 03/22/15 0742 03/22/15 1135  GLUCAP 307* 250* 273* 147* 339*    Principal Problem:   Bacteremia Active Problems:   DM2 (diabetes mellitus, type 2)   Lymphoma, follicular   COPD with acute exacerbation   Chronic respiratory failure with hypoxia   Time coordinating discharge: 35 minutes  Signed:  Murray Hodgkins, MD [facility] 03/22/2015, 11:29 AM   By signing my name below, I, Rosalie Doctor attest that this documentation has been prepared under the direction and in the presence of Murray Hodgkins, MD Electronically signed: Rosalie Doctor, Scribe.  03/22/2015 11:09am  I personally performed the services described in this documentation. All medical record entries made by the scribe were at my direction. I have reviewed the chart and agree that the record reflects my personal performance and is accurate and complete. Murray Hodgkins, MD

## 2015-03-22 NOTE — Progress Notes (Addendum)
PROGRESS NOTE  Billy Gray XTK:[id number] DOB: 05/13/1946 DOA: 03/20/2015 PCP: Pcp Not In System  Summary: 69 yo male with PMH stage II non-Hodgkin's lymphoma, currently undergoing chemotherapy (last performed 9/21 with Rituxan and Cytoxan) presented with abnormal labs and SOB. He was seen at [facility] for right-sided chest pain and shortness of breath. VQ scan was low probability and other blood work was unrevealing. Blood cultures were obtained which subsequently were positive for 1/2 bottles gram-negative rods. He was therefore instructed to return but preferred to come to the emergency department at [facility]. Main symptoms were right-sided chest pain, pleuritic with shortness of breath.  Assessment/Plan: 1. Klebsiella bacteremia, source unclear. Culture from [facility] revealed Klebsiella, susceptible to multiple abx including Augmentin. Afebrile, WBC wnl, no other suggesting symptoms. UA and UC unremarkable. No port. Consider bacterial translocation from gut. No abdominal symptoms.  Repeat BC from [facility] pending but show no growth to date.  2. Acute bronchitis, COPD exacerbation, pleurisy. Resolved. 3. Murmur. Chronicity unclear. Echocardiogram unremarkable 4. Chronic hypoxic respiratory failure, stable on 3L.  5. CKD stage III, stable 6. Anemia of chronic disease, stable.  7. DM type 2 with CKD stage III. Glucose 173. Expect improvement with decreased steroids. 8. NHL with last cycle of chemotherapy on 9/21 9. Chronic BLE edema without change. 10. Obesity   Overall improved. Change to oral abx, discussed with ID Dr. Tommy Medal, could be contaminant but recommends 2 weeks total treatment with oral abx.  Discharge home today.   Nutrition consult >> Diet heart healthy/carb modified   Code Status: Full DVT prophylaxis: SCDs Family discussion: Dicussed plan in detail with patient and wife. No further concerns at this time. Disposition Plan: Discharge home today.    Murray Hodgkins, MD  [facility]  Pager [phone] If 7PM-7AM, please contact night-coverage at [email or web address], password [email or web address] 03/22/2015, 11:27 AM  LOS: 2 days   Consultants:  Nutrition  Procedures:  ECHO - Left ventricle: The cavity size was normal. Wall thickness was increased in a pattern of mild LVH. Systolic function was vigorous. The estimated ejection fraction was in the range of 65% to 70%. Doppler parameters are consistent with abnormal left ventricular relaxation (grade 1 diastolic dysfunction). - Aortic valve: Mildly calcified annulus. Normal thickness leaflets. Valve area (VTI): 3.39 cm^2. Valve area (Vmax): 3.12 cm^2. - Mitral valve: Mildly calcified annulus. Normal thickness leaflets - Technically difficult study.  Antibiotics:  Rocephin 9/29>>9/30  Augmentin 10/1>>10/12   HPI/Subjective: Feels good. Denies any nausea, vomiting, pain, or SOB. Has a good appetite.   Objective: Filed Vitals:   03/22/15 0348 03/22/15 0611 03/22/15 0746 03/22/15 1118  BP:  142/63    Pulse:  70    Temp:  97.7 F (36.5 C)    TempSrc:  Oral    Resp:  20    Height:      Weight:      SpO2: 99% 100% 99% 93%    Intake/Output Summary (Last 24 hours) at 03/22/15 1127 Last data filed at 03/22/15 0900  Gross per 24 hour  Intake    840 ml  Output      0 ml  Net    840 ml     Filed Weights   03/20/15 0520  Weight: 95.255 kg (210 lb)    Exam: VSS, afebrile, not hypoxic General:  Appears calm and comfortable  Cardiovascular: RRR, no m/r/g. 1/2+ pitting LE edema.  Respiratory: CTA bilaterally, no w/r/r. Normal respiratory effort. Abdomen: soft, ntnd, positive bowel sounds  Psychiatric: grossly normal mood and affect, speech fluent and appropriate  New data reviewed:  Creatinine improving 1.48, LFTs unremarkable  BC no growth to date.  Pertinent data since admission:  CXR 1. Small right pleural effusion with atelectasis. 2. Background  COPD.  ECHO - Left ventricle: The cavity size was normal. Wall thickness was increased in a pattern of mild LVH. Systolic function was vigorous. The estimated ejection fraction was in the range of 65% to 70%. Doppler parameters are consistent with abnormal left ventricular relaxation (grade 1 diastolic dysfunction). - Aortic valve: Mildly calcified annulus. Normal thickness leaflets. Valve area (VTI): 3.39 cm^2. Valve area (Vmax): 3.12 cm^2. - Mitral valve: Mildly calcified annulus. Normal thickness leaflets - Technically difficult study.  Pending data:  UC/BC  Scheduled Meds: . allopurinol  150 mg Oral Daily  . amLODipine  5 mg Oral Daily  . amoxicillin-clavulanate  1 tablet Oral BID  . antiseptic oral rinse  7 mL Mouth Rinse BID  . aspirin EC  81 mg Oral Daily  . budesonide-formoterol  2 puff Inhalation BID  . enoxaparin (LOVENOX) injection  40 mg Subcutaneous Q24H  . feeding supplement (GLUCERNA SHAKE)  237 mL Oral q morning - 10a  . feeding supplement (PRO-STAT SUGAR FREE 64)  30 mL Oral Daily  . insulin aspart  0-20 Units Subcutaneous TID WC  . insulin aspart  0-5 Units Subcutaneous QHS  . insulin glargine  35 Units Subcutaneous QHS  . ipratropium-albuterol  3 mL Nebulization Q4H  . pravastatin  40 mg Oral QPM  . predniSONE  40 mg Oral Q breakfast  . sodium chloride  3 mL Intravenous Q12H  . sodium chloride  3 mL Intravenous Q12H  . traZODone  50 mg Oral QHS   Continuous Infusions:   Principal Problem:   Bacteremia Active Problems:   DM2 (diabetes mellitus, type 2)   Lymphoma, follicular   COPD with acute exacerbation   Chronic respiratory failure with hypoxia   By signing my name below, I, Rosalie Doctor attest that this documentation has been prepared under the direction and in the presence of Murray Hodgkins, MD Electronically signed: Rosalie Doctor, Scribe.  03/22/2015 11:09 AM  I personally performed the services described in this  documentation. All medical record entries made by the scribe were at my direction. I have reviewed the chart and agree that the record reflects my personal performance and is accurate and complete. Murray Hodgkins, MD

## 2015-03-22 NOTE — Progress Notes (Signed)
Patient discharged home today.  Patient was given discharge instructions, prescriptions, and care notes.  Patient verbalized understanding with no complaints or concerns voiced at this time.  Patient's IV was removed with catheter intact, no bleeding or complications.  Patient left unit in stable condition by a staff member in a wheelchair.

## 2015-03-25 LAB — CULTURE, BLOOD (ROUTINE X 2)
CULTURE: NO GROWTH
Culture: NO GROWTH

## 2015-04-01 ENCOUNTER — Encounter (HOSPITAL_COMMUNITY): Payer: Medicare Other | Attending: Hematology & Oncology

## 2015-04-01 ENCOUNTER — Encounter (HOSPITAL_COMMUNITY): Payer: Self-pay | Admitting: Hematology & Oncology

## 2015-04-01 ENCOUNTER — Encounter (HOSPITAL_BASED_OUTPATIENT_CLINIC_OR_DEPARTMENT_OTHER): Payer: Medicare Other | Admitting: Hematology & Oncology

## 2015-04-01 DIAGNOSIS — E1122 Type 2 diabetes mellitus with diabetic chronic kidney disease: Secondary | ICD-10-CM | POA: Insufficient documentation

## 2015-04-01 DIAGNOSIS — C8590 Non-Hodgkin lymphoma, unspecified, unspecified site: Secondary | ICD-10-CM | POA: Diagnosis not present

## 2015-04-01 DIAGNOSIS — C8290 Follicular lymphoma, unspecified, unspecified site: Secondary | ICD-10-CM | POA: Diagnosis present

## 2015-04-01 DIAGNOSIS — N179 Acute kidney failure, unspecified: Secondary | ICD-10-CM | POA: Diagnosis not present

## 2015-04-01 DIAGNOSIS — E1165 Type 2 diabetes mellitus with hyperglycemia: Secondary | ICD-10-CM | POA: Diagnosis not present

## 2015-04-01 DIAGNOSIS — R590 Localized enlarged lymph nodes: Secondary | ICD-10-CM | POA: Diagnosis present

## 2015-04-01 DIAGNOSIS — B961 Klebsiella pneumoniae [K. pneumoniae] as the cause of diseases classified elsewhere: Secondary | ICD-10-CM

## 2015-04-01 DIAGNOSIS — N189 Chronic kidney disease, unspecified: Secondary | ICD-10-CM | POA: Diagnosis present

## 2015-04-01 DIAGNOSIS — C8200 Follicular lymphoma grade I, unspecified site: Secondary | ICD-10-CM

## 2015-04-01 DIAGNOSIS — E559 Vitamin D deficiency, unspecified: Secondary | ICD-10-CM

## 2015-04-01 DIAGNOSIS — J449 Chronic obstructive pulmonary disease, unspecified: Secondary | ICD-10-CM

## 2015-04-01 DIAGNOSIS — R7881 Bacteremia: Secondary | ICD-10-CM

## 2015-04-01 LAB — COMPREHENSIVE METABOLIC PANEL WITH GFR
ALT: 24 U/L (ref 17–63)
AST: 21 U/L (ref 15–41)
Albumin: 2.7 g/dL — ABNORMAL LOW (ref 3.5–5.0)
Alkaline Phosphatase: 58 U/L (ref 38–126)
Anion gap: 6 (ref 5–15)
BUN: 17 mg/dL (ref 6–20)
CO2: 33 mmol/L — ABNORMAL HIGH (ref 22–32)
Calcium: 8 mg/dL — ABNORMAL LOW (ref 8.9–10.3)
Chloride: 98 mmol/L — ABNORMAL LOW (ref 101–111)
Creatinine, Ser: 1.78 mg/dL — ABNORMAL HIGH (ref 0.61–1.24)
GFR calc Af Amer: 43 mL/min — ABNORMAL LOW
GFR calc non Af Amer: 37 mL/min — ABNORMAL LOW
Glucose, Bld: 110 mg/dL — ABNORMAL HIGH (ref 65–99)
Potassium: 4 mmol/L (ref 3.5–5.1)
Sodium: 137 mmol/L (ref 135–145)
Total Bilirubin: 0.4 mg/dL (ref 0.3–1.2)
Total Protein: 5.3 g/dL — ABNORMAL LOW (ref 6.5–8.1)

## 2015-04-01 LAB — CBC WITH DIFFERENTIAL/PLATELET
Basophils Absolute: 0 K/uL (ref 0.0–0.1)
Basophils Relative: 0 %
Eosinophils Absolute: 0.4 K/uL (ref 0.0–0.7)
Eosinophils Relative: 9 %
HCT: 36.4 % — ABNORMAL LOW (ref 39.0–52.0)
Hemoglobin: 11.9 g/dL — ABNORMAL LOW (ref 13.0–17.0)
Lymphocytes Relative: 31 %
Lymphs Abs: 1.4 K/uL (ref 0.7–4.0)
MCH: 28.8 pg (ref 26.0–34.0)
MCHC: 32.7 g/dL (ref 30.0–36.0)
MCV: 88.1 fL (ref 78.0–100.0)
Monocytes Absolute: 0.9 K/uL (ref 0.1–1.0)
Monocytes Relative: 19 %
Neutro Abs: 1.8 K/uL (ref 1.7–7.7)
Neutrophils Relative %: 41 %
Platelets: 182 K/uL (ref 150–400)
RBC: 4.13 MIL/uL — ABNORMAL LOW (ref 4.22–5.81)
RDW: 13.9 % (ref 11.5–15.5)
WBC: 4.5 K/uL (ref 4.0–10.5)

## 2015-04-01 LAB — LACTATE DEHYDROGENASE: LDH: 193 U/L — ABNORMAL HIGH (ref 98–192)

## 2015-04-01 LAB — SEDIMENTATION RATE: Sed Rate: 31 mm/h — ABNORMAL HIGH (ref 0–16)

## 2015-04-01 NOTE — Patient Instructions (Signed)
[place] at [facility] Discharge Instructions  RECOMMENDATIONS MADE BY THE CONSULTANT AND ANY TEST RESULTS WILL BE SENT TO YOUR REFERRING PHYSICIAN.  Exam and discussion by Dr. Whitney Muse No chemotherapy today Will reschedule treatment for next week Follow-up in 1 week.  Thank you for choosing [place] at [facility] to provide your oncology and hematology care.  To afford each patient quality time with our provider, please arrive at least 15 minutes before your scheduled appointment time.    You need to re-schedule your appointment should you arrive 10 or more minutes late.  We strive to give you quality time with our providers, and arriving late affects you and other patients whose appointments are after yours.  Also, if you no show three or more times for appointments you may be dismissed from the clinic at the providers discretion.     Again, thank you for choosing [facility].  Our hope is that these requests will decrease the amount of time that you wait before being seen by our physicians.       _____________________________________________________________  Should you have questions after your visit to [facility], please contact our office at [phone] between the hours of 8:30 a.m. and 4:30 p.m.  Voicemails left after 4:30 p.m. will not be returned until the following business day.  For prescription refill requests, have your pharmacy contact our office.

## 2015-04-01 NOTE — Patient Instructions (Addendum)
[facility] Discharge Instructions for Patients Receiving Chemotherapy  Today you received the following chemotherapy agents hold chemotherapy today Reschedule chemotherapy for next week Return to see the doctor next week Please call the clinic if you have any questions or concerns   To help prevent nausea and vomiting after your treatment, we encourage you to take your nausea medication    If you develop nausea and vomiting, or diarrhea that is not controlled by your medication, call the clinic.  The clinic phone number is [phone]. Office hours are Monday-Friday 8:30am-5:00pm.  BELOW ARE SYMPTOMS THAT SHOULD BE REPORTED IMMEDIATELY:  *FEVER GREATER THAN 101.0 F  *CHILLS WITH OR WITHOUT FEVER  NAUSEA AND VOMITING THAT IS NOT CONTROLLED WITH YOUR NAUSEA MEDICATION  *UNUSUAL SHORTNESS OF BREATH  *UNUSUAL BRUISING OR BLEEDING  TENDERNESS IN MOUTH AND THROAT WITH OR WITHOUT PRESENCE OF ULCERS  *URINARY PROBLEMS  *BOWEL PROBLEMS  UNUSUAL RASH Items with * indicate a potential emergency and should be followed up as soon as possible. If you have an emergency after office hours please contact your primary care physician or go to the nearest emergency department.  Please call the clinic during office hours if you have any questions or concerns.   You may also contact the Patient Navigator at [phone] should you have any questions or need assistance in obtaining follow up care. _____________________________________________________________________ Have you asked about our STAR program?    STAR stands for Survivorship Training and Rehabilitation, and this is a nationally recognized cancer care program that focuses on survivorship and rehabilitation.  Cancer and cancer treatments may cause problems, such as, pain, making you feel tired and keeping you from doing the things that you need or want to do. Cancer rehabilitation can help. Our goal is to reduce these  troubling effects and help you have the best quality of life possible.  You may receive a survey from a nurse that asks questions about your current state of health.  Based on the survey results, all eligible patients will be referred to the [facility] program for an evaluation so we can better serve you! A frequently asked questions sheet is available upon request.

## 2015-04-01 NOTE — Progress Notes (Signed)
Hold chemotherapy per Dr Whitney Muse

## 2015-04-01 NOTE — Progress Notes (Signed)
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 male is here for follow-up of his NHL.  He is due for C#6 of chemotherapy today. He was recently admitted to the inpatient service with klebsiella bacteremia. He notes he was seen at the [facility] to undergo pulmonary evaluation. He had developed severe pain with inspiration on the right chest. He had a VQ scan at [facility]  that was low probability. He had blood cultures and reports that he received a call early in the morning to come back for admission. The patient and his wife note they came to [facility] since he is getting his chemotherapy treatment here.  He notes his breathing is now better.  He no longer experiences any inspirational pains.  Denies nausea and vomiting while taking antibiotics. Denies night sweats.  Notes his appetite is well.  He is actively walking at home.  He has some weakness.  He complains of experiencing hand cramps.  He notes this especially occurs while driving.  He is up-to-date on his flu and pnuemonia vaccinations.  He had a sleep study last Sunday completed at the [place].   MEDICAL HISTORY:  Past Medical History  Diagnosis Date  . COPD (chronic obstructive pulmonary disease) ([place])   . Hypertension   . Diabetes mellitus without  complication ([place])   . Chronic kidney disease   . NHL (non-Hodgkin's lymphoma) ([place])     SURGICAL HISTORY: Past Surgical History  Procedure Laterality Date  . Replacement total knee      right  . Appendectomy      SOCIAL HISTORY: Social History   Social History  . Marital Status: Married    Spouse Name: N/A  . Number of Children: N/A  . Years of Education: N/A   Occupational History  . Not on file.   Social History Main Topics  . Smoking status: Former Smoker    Quit date: 09/07/2001  . Smokeless tobacco: Not on file  . Alcohol Use: No  . Drug Use: No  . Sexual Activity: Not on file   Other Topics Concern  . Not on file   Social History Narrative    FAMILY HISTORY: Family History  Problem Relation Age of Onset  . Diabetes Mother   . Diabetes Father   . Cancer Brother   . Diabetes Brother    has no family status information on file.   ALLERGIES:  has No Known Allergies.  MEDICATIONS:  Current Outpatient Prescriptions  Medication Sig Dispense Refill  . acetaminophen (TYLENOL) 500 MG tablet Take 500 mg by mouth every 6 (six) hours as needed for moderate pain.    Marland Kitchen albuterol (PROVENTIL HFA;VENTOLIN HFA) 108 (90 BASE) MCG/ACT inhaler Inhale 2 puffs into the lungs every 6 (six) hours as needed for wheezing or shortness of breath.    . allopurinol (ZYLOPRIM) 100 MG tablet Take 1 tablet (100 mg total) by  mouth daily. (Patient taking differently: Take 150 mg by mouth daily. ) 30 tablet 0  . amLODipine (NORVASC) 5 MG tablet Take 1 tablet (5 mg total) by mouth daily. 30 tablet 4  . amoxicillin-clavulanate (AUGMENTIN) 875-125 MG tablet Take 1 tablet by mouth 2 (two) times daily. 23 tablet 0  . budesonide-formoterol (SYMBICORT) 160-4.5 MCG/ACT inhaler Inhale 2 puffs into the lungs 2 (two) times daily.     . insulin glargine (LANTUS) 100 UNIT/ML injection Inject 0.35 mLs (35 Units total) into the skin at bedtime.    . insulin lispro (HUMALOG) 100 UNIT/ML injection  151-200 = 2 units, 201-250 = 4 units, 251-300 = 6 units, 301 or greater = 8 units & call MD (Patient taking differently: Inject 0-8 Units into the skin 3 (three) times daily with meals. 151-200 = 2 units, 201-250 = 4 units, 251-300 = 6 units, 301 or greater = 8 units & call MD) 10 mL 1  . pravastatin (PRAVACHOL) 40 MG tablet Take 40 mg by mouth every evening.    . predniSONE (DELTASONE) 10 MG tablet Take 20 mg by mouth daily for 3 days, then take 10 mg by mouth daily for 3 days, then stop. 9 tablet 0  . tiotropium (SPIRIVA) 18 MCG inhalation capsule Place 18 mcg into inhaler and inhale 2 (two) times daily. 2 puffs 2 times daily    . zolpidem (AMBIEN) 10 MG tablet Take 10 mg by mouth at bedtime.    . traZODone (DESYREL) 50 MG tablet Take 50 mg by mouth at bedtime.     No current facility-administered medications for this visit.    Review of Systems  All other systems reviewed and are negative. 14 point ROS was done and is otherwise as detailed above or in HPI   PHYSICAL EXAMINATION: ECOG PERFORMANCE STATUS: 1 - Symptomatic but completely ambulatory  There were no vitals filed for this visit. There were no vitals filed for this visit.   Physical Exam  Constitutional: He is oriented to person, place, and time and well-developed, well-nourished, and in no distress. Lying in hospital bed. Talkative Wearing O2  HENT:  Head: Normocephalic and atraumatic.  Nose: Nose normal.  Mouth/Throat: Oropharynx is clear and moist. No oropharyngeal exudate.  Eyes: Conjunctivae and EOM are normal. Pupils are equal, round, and reactive to light. Right eye exhibits no discharge. Left eye exhibits no discharge. No scleral icterus.  Neck: Normal range of motion. Neck supple. No tracheal deviation present. No thyromegaly present.  Cardiovascular: Normal rate, regular rhythm and normal heart sounds.  Exam reveals no gallop and no friction rub.   No murmur heard. Pulmonary/Chest: Effort normal and breath sounds  normal. He has no wheezes. He has no rales.  Abdominal: Soft. Bowel sounds are normal. He exhibits no distension and no mass. There is no tenderness. There is no rebound and no guarding.  obese  Musculoskeletal: Normal range of motion. He exhibits no edema.  Lymphadenopathy:    He has no cervical adenopathy.  Neurological: He is alert and oriented to person, place, and time. He has normal reflexes. No cranial nerve deficit. Gait normal. Coordination normal.  Skin: Skin is warm and dry. No rash noted.  Psychiatric: Mood, memory, affect and judgment normal.  Nursing note and vitals reviewed.    LABORATORY DATA:  I have reviewed the data as listed Lab Results  Component Value Date   WBC 4.5 04/01/2015   HGB 11.9* 04/01/2015   HCT 36.4* 04/01/2015   MCV 88.1  04/01/2015   PLT 182 04/01/2015   CMP     Component Value Date/Time   NA 137 04/01/2015 0835   NA 140 01/02/2015 0959   K 4.0 04/01/2015 0835   K 4.3 01/02/2015 0959   CL 98* 04/01/2015 0835   CO2 33* 04/01/2015 0835   CO2 38* 01/02/2015 0959   GLUCOSE 110* 04/01/2015 0835   GLUCOSE 219* 01/02/2015 0959   BUN 17 04/01/2015 0835   BUN 11.6 01/02/2015 0959   CREATININE 1.78* 04/01/2015 0835   CREATININE 2.5* 01/02/2015 0959   CALCIUM 8.0* 04/01/2015 0835   CALCIUM 10.1 01/02/2015 0959   CALCIUM 15.0* 12/03/2014 2104   PROT 5.3* 04/01/2015 0835   PROT 6.0* 01/02/2015 0959   ALBUMIN 2.7* 04/01/2015 0835   ALBUMIN 2.9* 01/02/2015 0959   AST 21 04/01/2015 0835   AST 11 01/02/2015 0959   ALT 24 04/01/2015 0835   ALT 9 01/02/2015 0959   ALKPHOS 58 04/01/2015 0835   ALKPHOS 104 01/02/2015 0959   BILITOT 0.4 04/01/2015 0835   BILITOT 0.28 01/02/2015 0959   GFRNONAA 37* 04/01/2015 0835   GFRAA 43* 04/01/2015 0835   RADIOLOGY: CLINICAL DATA: Restaging non Hodgkin lymphoma, ongoing chemotherapy.  EXAM: CT CHEST, ABDOMEN AND PELVIS WITHOUT CONTRAST  TECHNIQUE: Multidetector CT imaging of the chest, abdomen and  pelvis was performed following the standard protocol without IV contrast.  COMPARISON: 12/03/2014.  FINDINGS: CT CHEST FINDINGS  Mediastinum/Nodes: Mediastinal lymph nodes are not enlarged by CT size criteria. Hilar regions are difficult to definitively evaluate without IV contrast. Ascending aorta measures approximately 4.0 cm. Three-vessel coronary artery calcification. Heart size normal. No pericardial effusion.  Lungs/Pleura: Mild to moderate centrilobular emphysema. Suspect progressive pulmonary parenchymal scarring in the right middle lobe and right lower lobe. Mild bronchial wall thickening. No pleural fluid. Airway is unremarkable.  Musculoskeletal: No worrisome lytic or sclerotic lesions. Degenerative changes are seen in the spine.  CT ABDOMEN AND PELVIS FINDINGS  Hepatobiliary: Liver and gallbladder are unremarkable. No biliary ductal dilatation.  Pancreas: Negative.  Spleen: Negative, normal in size.  Adrenals/Urinary Tract: Adrenal glands are unremarkable. Several low-attenuation lesions are seen off the kidneys bilaterally, measuring up to 8.8 cm on the left. A 4.6 cm lesion in the interpolar left kidney measures 18 Hounsfield units, stable. No urinary stones. Ureters are decompressed. Bladder is unremarkable.  Stomach/Bowel: Stomach, small bowel and colon are unremarkable. Appendectomy.  Vascular/Lymphatic: Atherosclerotic calcification of the arterial vasculature without abdominal aortic aneurysm. A retroperitoneal soft tissue mass with somewhat ill-defined borders measures 5.4 x 7.4 cm (previously 7.4 x 12.3 cm). It is seen in the aortocaval station and surrounds the IVC. Additional left periaortic lymph nodes have decreased in size as well, measuring up to 9 mm (previously 19 mm. Small lymph nodes are seen along the iliac chains bilaterally, not considered pathologically enlarged by CT size criteria.  Reproductive: Prostate is at the  upper limits of normal in size.  Other: Small right inguinal hernia contains fat. There is haziness in the small bowel mesentery with the largest individual nodule measuring 10 mm in short axis, decreased from 1.6 cm. No free fluid.  Musculoskeletal: No worrisome lytic or sclerotic lesions. Degenerative changes are seen in the spine.  IMPRESSION: 1. Interval response to therapy as evidenced by decrease in size of retroperitoneal and small bowel mesenteric adenopathy. 2. Three-vessel coronary artery calcification and borderline ascending aortic aneurysm. 3. Several low-attenuation lesions in the kidneys, with 1 intermediate density lesion in the left kidney, unchanged  from 12/03/2014 but difficult to further characterize without post-contrast imaging.   Electronically Signed  By: Lorin Picket M.D.  On: 02/10/2015 14:10   ASSESSMENT & PLAN:  Stage IIX Non-Hodgkin's lymphoma, low-grade B cell lymphoma, CD20 and CD10 positive, on a core biopsy of a retroperitoneal mass 12/05/2014 Hypercalcemia of malignancy-resolved, calcium of >15 mg/dl on 12/03/2014 ARF secondary to above Vitamin D 103 pg/ml on 12/04/2014 Pamidronate on 12/20/2014 Hyperglycemia secondary to prednisone/ known history diabetes Klebsiella Bacteremia   He has had a partial response to therapy thus far. I have recommended completing 6 cycles of treatment. I will also recommend maintenance Rituxan. Clinically he is markedly improved and is doing well.  Unfortunately he has been hospitalized with klebsiella bacteremia. He is still on antibiotics. He denies fever. He is overall improved.  At this point I would recommend delaying chemotherapy until next week. He is agreeable.  All questions were answered. The patient knows to call the clinic with any problems, questions or concerns.   This note was electronically signed.   This document serves as a record of services personally performed by Ancil Linsey, MD.  It was created on her behalf by Janace Hoard, a trained medical scribe. The creation of this record is based on the scribe's personal observations and the provider's statements to them. This document has been checked and approved by the attending provider.  I have reviewed the above documentation for accuracy and completeness, and I agree with the above.  Kelby Fam. Whitney Muse, MD

## 2015-04-08 ENCOUNTER — Encounter (HOSPITAL_BASED_OUTPATIENT_CLINIC_OR_DEPARTMENT_OTHER): Payer: Medicare Other | Admitting: Hematology & Oncology

## 2015-04-08 ENCOUNTER — Encounter (HOSPITAL_COMMUNITY): Payer: Self-pay | Admitting: Hematology & Oncology

## 2015-04-08 ENCOUNTER — Encounter (HOSPITAL_BASED_OUTPATIENT_CLINIC_OR_DEPARTMENT_OTHER): Payer: Medicare Other

## 2015-04-08 VITALS — BP 162/77 | HR 93 | Temp 98.0°F | Resp 20 | Wt 202.9 lb

## 2015-04-08 VITALS — BP 136/77 | HR 82 | Temp 98.1°F | Resp 16

## 2015-04-08 DIAGNOSIS — C8203 Follicular lymphoma grade I, intra-abdominal lymph nodes: Secondary | ICD-10-CM

## 2015-04-08 DIAGNOSIS — R590 Localized enlarged lymph nodes: Secondary | ICD-10-CM

## 2015-04-08 DIAGNOSIS — C8298 Follicular lymphoma, unspecified, lymph nodes of multiple sites: Secondary | ICD-10-CM

## 2015-04-08 DIAGNOSIS — Z5111 Encounter for antineoplastic chemotherapy: Secondary | ICD-10-CM | POA: Diagnosis not present

## 2015-04-08 DIAGNOSIS — Z5112 Encounter for antineoplastic immunotherapy: Secondary | ICD-10-CM | POA: Diagnosis not present

## 2015-04-08 DIAGNOSIS — C8290 Follicular lymphoma, unspecified, unspecified site: Secondary | ICD-10-CM | POA: Diagnosis not present

## 2015-04-08 MED ORDER — SODIUM CHLORIDE 0.9 % IJ SOLN
10.0000 mL | INTRAMUSCULAR | Status: DC | PRN
  Administered 2015-04-08: 10 mL
  Filled 2015-04-08: qty 10

## 2015-04-08 MED ORDER — SODIUM CHLORIDE 0.9 % IV SOLN
Freq: Once | INTRAVENOUS | Status: AC
  Administered 2015-04-08: 10:00:00 via INTRAVENOUS
  Filled 2015-04-08: qty 8

## 2015-04-08 MED ORDER — ACETAMINOPHEN 325 MG PO TABS
ORAL_TABLET | ORAL | Status: AC
  Filled 2015-04-08: qty 2

## 2015-04-08 MED ORDER — ACETAMINOPHEN 325 MG PO TABS
650.0000 mg | ORAL_TABLET | Freq: Once | ORAL | Status: AC
  Administered 2015-04-08: 650 mg via ORAL

## 2015-04-08 MED ORDER — SODIUM CHLORIDE 0.9 % IV SOLN
375.0000 mg/m2 | Freq: Once | INTRAVENOUS | Status: AC
  Administered 2015-04-08: 800 mg via INTRAVENOUS
  Filled 2015-04-08: qty 80

## 2015-04-08 MED ORDER — SODIUM CHLORIDE 0.9 % IV SOLN
400.0000 mg/m2 | Freq: Once | INTRAVENOUS | Status: AC
  Administered 2015-04-08: 880 mg via INTRAVENOUS
  Filled 2015-04-08: qty 44

## 2015-04-08 MED ORDER — SODIUM CHLORIDE 0.9 % IV SOLN
Freq: Once | INTRAVENOUS | Status: AC
  Administered 2015-04-08: 10:00:00 via INTRAVENOUS

## 2015-04-08 MED ORDER — DIPHENHYDRAMINE HCL 25 MG PO CAPS
50.0000 mg | ORAL_CAPSULE | Freq: Once | ORAL | Status: AC
  Administered 2015-04-08: 50 mg via ORAL

## 2015-04-08 MED ORDER — DIPHENHYDRAMINE HCL 25 MG PO CAPS
ORAL_CAPSULE | ORAL | Status: AC
  Filled 2015-04-08: qty 2

## 2015-04-08 NOTE — Progress Notes (Signed)
Per Dr.Penland, treat with chemo today, labs from last week are ok. Tolerated chemo well. Ambulatory on discharge.

## 2015-04-08 NOTE — Progress Notes (Signed)
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 male is here for follow-up of his NHL.  He is due for C#6 of chemotherapy today. He was recently admitted to [facility] inpatient service with klebsiella bacteremia. He notes he was seen at [facility] to undergo pulmonary evaluation. He had developed severe pain with inspiration on Billy right chest. He had a VQ scan at [facility]  that was low probability. He had blood cultures and reports that he received a call early in [facility] morning to come back for admission. [facility] patient and his wife note they came to [facility] since he is getting his chemotherapy treatment here.  Chemotherapy was held last week until [facility] patient completed his full course of antibiotics.  [facility] patient notes that he has been doing better since his last visit and treatment.  His abdominal pain has subsided.  His erythema has subsided.  He has no questions or concerns at this time.  He presents today for his last treatment.    MEDICAL HISTORY:  Past Medical History  Diagnosis Date  . COPD (chronic obstructive pulmonary disease) ([facility])   . Hypertension   . Diabetes mellitus without complication ([place])   . Chronic kidney disease   . NHL (non-Hodgkin's lymphoma) ([facility])     SURGICAL HISTORY: Past Surgical  History  Procedure Laterality Date  . Replacement total knee      right  . Appendectomy      SOCIAL HISTORY: Social History   Social History  . Marital Status: Married    Spouse Name: N/A  . Number of Children: N/A  . Years of Education: N/A   Occupational History  . Not on file.   Social History Main Topics  . Smoking status: Former Smoker    Quit date: 09/07/2001  . Smokeless tobacco: Not on file  . Alcohol Use: No  . Drug Use: No  . Sexual Activity: Not on file   Other Topics Concern  . Not on file   Social History Narrative    FAMILY HISTORY: Family History  Problem Relation Age of Onset  . Diabetes Mother   . Diabetes Father   . Cancer Brother   . Diabetes Brother    indicated that his mother is deceased. He indicated that his father is deceased.   ALLERGIES:  has No Known Allergies.  MEDICATIONS:  Current Outpatient Prescriptions  Medication Sig Dispense Refill  . acetaminophen (TYLENOL) 500 MG tablet Take 500 mg by mouth every 6 (six) hours as needed for moderate pain.    Marland Kitchen albuterol (PROVENTIL HFA;VENTOLIN HFA) 108 (90 BASE) MCG/ACT inhaler Inhale 2 puffs into Billy lungs every 6 (six) hours as needed for wheezing or shortness of breath.    . allopurinol (ZYLOPRIM) 100 MG tablet Take 1 tablet (100 mg total) by mouth daily. (Patient taking differently: Take 150 mg by mouth daily. ) 30 tablet 0  .  amLODipine (NORVASC) 5 MG tablet Take 1 tablet (5 mg total) by mouth daily. 30 tablet 4  . amoxicillin-clavulanate (AUGMENTIN) 875-125 MG tablet Take 1 tablet by mouth 2 (two) times daily. 23 tablet 0  . budesonide-formoterol (SYMBICORT) 160-4.5 MCG/ACT inhaler Inhale 2 puffs into Billy lungs 2 (two) times daily.     . insulin glargine (LANTUS) 100 UNIT/ML injection Inject 0.35 mLs (35 Units total) into Billy skin at bedtime.    . insulin lispro (HUMALOG) 100 UNIT/ML injection 151-200 = 2 units, 201-250 = 4 units, 251-300 = 6 units, 301 or greater = 8 units & call MD  (Patient taking differently: Inject 0-8 Units into Billy skin 3 (three) times daily with meals. 151-200 = 2 units, 201-250 = 4 units, 251-300 = 6 units, 301 or greater = 8 units & call MD) 10 mL 1  . pravastatin (PRAVACHOL) 40 MG tablet Take 40 mg by mouth every evening.    . predniSONE (DELTASONE) 10 MG tablet Take 20 mg by mouth daily for 3 days, then take 10 mg by mouth daily for 3 days, then stop. 9 tablet 0  . tiotropium (SPIRIVA) 18 MCG inhalation capsule Place 18 mcg into inhaler and inhale 2 (two) times daily. 2 puffs 2 times daily    . traZODone (DESYREL) 50 MG tablet Take 50 mg by mouth at bedtime.    Marland Kitchen zolpidem (AMBIEN) 10 MG tablet Take 10 mg by mouth at bedtime.     No current facility-administered medications for this visit.   Facility-Administered Medications Ordered in Other Visits  Medication Dose Route Frequency Provider Last Rate Last Dose  . 0.9 %  sodium chloride infusion   Intravenous Once Patrici Ranks, MD      . acetaminophen (TYLENOL) tablet 650 mg  650 mg Oral Once Patrici Ranks, MD      . diphenhydrAMINE (BENADRYL) capsule 50 mg  50 mg Oral Once Patrici Ranks, MD      . ondansetron (ZOFRAN) 16 mg, dexamethasone (DECADRON) 20 mg in sodium chloride 0.9 % 50 mL IVPB   Intravenous Once Patrici Ranks, MD      . sodium chloride 0.9 % injection 10 mL  10 mL Intracatheter PRN Patrici Ranks, MD   10 mL at 04/08/15 0930    Review of Systems  All other systems reviewed and are negative. 14 point ROS was done and is otherwise as detailed above or in HPI   PHYSICAL EXAMINATION: ECOG PERFORMANCE STATUS: 1 - Symptomatic but completely ambulatory  Filed Vitals:   04/08/15 0906  BP: 162/77  Pulse: 93  Temp: 98 F (36.7 C)  Resp: 20   Filed Weights   04/08/15 0906  Weight: 202 lb 14.4 oz (92.035 kg)    Physical Exam  Constitutional: He is oriented to person, place, and time and well-developed, well-nourished, and in no distress. Lying in [facility]  bed. Talkative Wearing O2  HENT:  Head: Normocephalic and atraumatic.  Nose: Nose normal.  Mouth/Throat: Oropharynx is clear and moist. No oropharyngeal exudate.  Eyes: Conjunctivae and EOM are normal. Pupils are equal, round, and reactive to light. Right eye exhibits no discharge. Left eye exhibits no discharge. No scleral icterus.  Neck: Normal range of motion. Neck supple. No tracheal deviation present. No thyromegaly present.  Cardiovascular: Normal rate, regular rhythm and normal heart sounds.  Exam reveals no gallop and no friction rub.   No murmur heard. Pulmonary/Chest: Effort normal and breath sounds normal. He has no  wheezes. He has no rales.  Abdominal: Soft. Bowel sounds are normal. He exhibits no distension and no mass. There is no tenderness. There is no rebound and no guarding.  obese  Musculoskeletal: Normal range of motion. He exhibits no edema.  Lymphadenopathy:    He has no cervical adenopathy.  Neurological: He is alert and oriented to person, place, and time. He has normal reflexes. No cranial nerve deficit. Gait normal. Coordination normal.  Skin: Skin is warm and dry. No rash noted.  Psychiatric: Mood, memory, affect and judgment normal.  Nursing note and vitals reviewed.    LABORATORY DATA:  I have reviewed Billy data as listed Lab Results  Component Value Date   WBC 4.5 04/01/2015   HGB 11.9* 04/01/2015   HCT 36.4* 04/01/2015   MCV 88.1 04/01/2015   PLT 182 04/01/2015   CMP     Component Value Date/Time   NA 137 04/01/2015 0835   NA 140 01/02/2015 0959   K 4.0 04/01/2015 0835   K 4.3 01/02/2015 0959   CL 98* 04/01/2015 0835   CO2 33* 04/01/2015 0835   CO2 38* 01/02/2015 0959   GLUCOSE 110* 04/01/2015 0835   GLUCOSE 219* 01/02/2015 0959   BUN 17 04/01/2015 0835   BUN 11.6 01/02/2015 0959   CREATININE 1.78* 04/01/2015 0835   CREATININE 2.5* 01/02/2015 0959   CALCIUM 8.0* 04/01/2015 0835   CALCIUM 10.1 01/02/2015 0959   CALCIUM 15.0* 12/03/2014  2104   PROT 5.3* 04/01/2015 0835   PROT 6.0* 01/02/2015 0959   ALBUMIN 2.7* 04/01/2015 0835   ALBUMIN 2.9* 01/02/2015 0959   AST 21 04/01/2015 0835   AST 11 01/02/2015 0959   ALT 24 04/01/2015 0835   ALT 9 01/02/2015 0959   ALKPHOS 58 04/01/2015 0835   ALKPHOS 104 01/02/2015 0959   BILITOT 0.4 04/01/2015 0835   BILITOT 0.28 01/02/2015 0959   GFRNONAA 37* 04/01/2015 0835   GFRAA 43* 04/01/2015 0835   RADIOLOGY: CLINICAL DATA: Restaging non Hodgkin lymphoma, ongoing chemotherapy.  EXAM: CT CHEST, ABDOMEN AND PELVIS WITHOUT CONTRAST  TECHNIQUE: Multidetector CT imaging of Billy chest, abdomen and pelvis was performed following Billy standard protocol without IV contrast.  COMPARISON: 12/03/2014.  FINDINGS: CT CHEST FINDINGS  Mediastinum/Nodes: Mediastinal lymph nodes are not enlarged by CT size criteria. Hilar regions are difficult to definitively evaluate without IV contrast. Ascending aorta measures approximately 4.0 cm. Three-vessel coronary artery calcification. Heart size normal. No pericardial effusion.  Lungs/Pleura: Mild to moderate centrilobular emphysema. Suspect progressive pulmonary parenchymal scarring in Billy right middle lobe and right lower lobe. Mild bronchial wall thickening. No pleural fluid. Airway is unremarkable.  Musculoskeletal: No worrisome lytic or sclerotic lesions. Degenerative changes are seen in Billy spine.  CT ABDOMEN AND PELVIS FINDINGS  Hepatobiliary: Liver and gallbladder are unremarkable. No biliary ductal dilatation.  Pancreas: Negative.  Spleen: Negative, normal in size.  Adrenals/Urinary Tract: Adrenal glands are unremarkable. Several low-attenuation lesions are seen off Billy kidneys bilaterally, measuring up to 8.8 cm on Billy left. A 4.6 cm lesion in Billy interpolar left kidney measures 18 Hounsfield units, stable. No urinary stones. Ureters are decompressed. Bladder is unremarkable.  Stomach/Bowel: Stomach, small  bowel and colon are unremarkable. Appendectomy.  Vascular/Lymphatic: Atherosclerotic calcification of Billy arterial vasculature without abdominal aortic aneurysm. A retroperitoneal soft tissue mass with somewhat ill-defined borders measures 5.4 x 7.4 cm (previously 7.4 x 12.3 cm). It is seen in Billy aortocaval station and surrounds Billy IVC. Additional left periaortic lymph nodes have decreased  in size as well, measuring up to 9 mm (previously 19 mm. Small lymph nodes are seen along Billy iliac chains bilaterally, not considered pathologically enlarged by CT size criteria.  Reproductive: Prostate is at Billy upper limits of normal in size.  Other: Small right inguinal hernia contains fat. There is haziness in Billy small bowel mesentery with Billy largest individual nodule measuring 10 mm in short axis, decreased from 1.6 cm. No free fluid.  Musculoskeletal: No worrisome lytic or sclerotic lesions. Degenerative changes are seen in Billy spine.  IMPRESSION: 1. Interval response to therapy as evidenced by decrease in size of retroperitoneal and small bowel mesenteric adenopathy. 2. Three-vessel coronary artery calcification and borderline ascending aortic aneurysm. 3. Several low-attenuation lesions in Billy kidneys, with 1 intermediate density lesion in Billy left kidney, unchanged from 12/03/2014 but difficult to further characterize without post-contrast imaging.   Electronically Signed  By: Lorin Picket M.D.  On: 02/10/2015 14:10   ASSESSMENT & PLAN:  Stage IIX Non-Hodgkin's lymphoma, low-grade B cell lymphoma, CD20 and CD10 positive, on a core biopsy of a retroperitoneal mass 12/05/2014 Hypercalcemia of malignancy-resolved, calcium of >15 mg/dl on 12/03/2014 ARF secondary to above Vitamin D 103 pg/ml on 12/04/2014 Pamidronate on 12/20/2014 Hyperglycemia secondary to prednisone/ known history diabetes Klebsiella Bacteremia   Other than his chronic breathing issues Billy  patient is doing well. He has completed all of his antibiotics. He has had no problems with fever. We will proceed with his final cycle of chemotherapy today.(Rituxan, cytoxan and prednisone) We have discussed maintenance Rituxan therapy and he is very interested in proceeding with this.   I have recommended repeat imaging prior to starting maintenance Rituxan. After cycle 3 of chemotherapy he would had a partial response to treatment but clinically he was markedly improved.  All questions were answered. Billy patient knows to call [facility] clinic with any problems, questions or concerns.   This note was electronically signed.   This document serves as a record of services personally performed by Ancil Linsey, MD. It was created on her behalf by Janace Hoard, a trained medical scribe. Billy creation of this record is based on Billy scribe's personal observations and Billy provider's statements to them. This document has been checked and approved by Billy attending provider.  I have reviewed Billy above documentation for accuracy and completeness, and I agree with Billy above.  Kelby Fam. Whitney Muse, MD

## 2015-04-08 NOTE — Patient Instructions (Signed)
[place] at [facility] Discharge Instructions  RECOMMENDATIONS MADE BY THE CONSULTANT AND ANY TEST RESULTS WILL BE SENT TO YOUR REFERRING PHYSICIAN.  Exam done and seen by Dr. Whitney Muse Scans due in November Follow up after scans  Thank you for choosing [facility] at [facility] to provide your oncology and hematology care.  To afford each patient quality time with our provider, please arrive at least 15 minutes before your scheduled appointment time.    You need to re-schedule your appointment should you arrive 10 or more minutes late.  We strive to give you quality time with our providers, and arriving late affects you and other patients whose appointments are after yours.  Also, if you no show three or more times for appointments you may be dismissed from the clinic at the providers discretion.     Again, thank you for choosing [facility].  Our hope is that these requests will decrease the amount of time that you wait before being seen by our physicians.       _____________________________________________________________  Should you have questions after your visit to [facility], please contact our office at [phone] between the hours of 8:30 a.m. and 4:30 p.m.  Voicemails left after 4:30 p.m. will not be returned until the following business day.  For prescription refill requests, have your pharmacy contact our office.

## 2015-04-08 NOTE — Patient Instructions (Signed)
[facility] Discharge Instructions for Patients Receiving Chemotherapy  Today you received the following chemotherapy agents Rituxan and Cytoxan.  To help prevent nausea and vomiting after your treatment, we encourage you to take your nausea medication as instructed. If you develop nausea and vomiting that is not controlled by your nausea medication, call the clinic. If it is after clinic hours your family physician or the after hours number for the clinic or go to the Emergency Department. BELOW ARE SYMPTOMS THAT SHOULD BE REPORTED IMMEDIATELY:  *FEVER GREATER THAN 101.0 F  *CHILLS WITH OR WITHOUT FEVER  NAUSEA AND VOMITING THAT IS NOT CONTROLLED WITH YOUR NAUSEA MEDICATION  *UNUSUAL SHORTNESS OF BREATH  *UNUSUAL BRUISING OR BLEEDING  TENDERNESS IN MOUTH AND THROAT WITH OR WITHOUT PRESENCE OF ULCERS  *URINARY PROBLEMS  *BOWEL PROBLEMS  UNUSUAL RASH Items with * indicate a potential emergency and should be followed up as soon as possible.  Return as scheduled.  I have been informed and understand all the instructions given to me. I know to contact the clinic, my physician, or go to the Emergency Department if any problems should occur. I do not have any questions at this time, but understand that I may call the clinic during office hours or the Patient Navigator at [phone] should I have any questions or need assistance in obtaining follow up care.    __________________________________________  _____________  __________ Signature of Patient or Authorized Representative            Date                   Time    __________________________________________ Nurse's Signature

## 2015-04-09 ENCOUNTER — Encounter (HOSPITAL_COMMUNITY): Payer: Self-pay | Admitting: Hematology & Oncology

## 2015-05-06 ENCOUNTER — Ambulatory Visit (HOSPITAL_COMMUNITY)
Admission: RE | Admit: 2015-05-06 | Discharge: 2015-05-06 | Disposition: A | Discharge status: Home / Self Care | Payer: Medicare Other | Source: Ambulatory Visit | Attending: Hematology & Oncology | Admitting: Hematology & Oncology

## 2015-05-06 DIAGNOSIS — R93422 Abnormal radiologic findings on diagnostic imaging of left kidney: Secondary | ICD-10-CM | POA: Insufficient documentation

## 2015-05-06 DIAGNOSIS — N281 Cyst of kidney, acquired: Secondary | ICD-10-CM | POA: Insufficient documentation

## 2015-05-06 DIAGNOSIS — C8203 Follicular lymphoma grade I, intra-abdominal lymph nodes: Secondary | ICD-10-CM

## 2015-05-06 DIAGNOSIS — Z79899 Other long term (current) drug therapy: Secondary | ICD-10-CM | POA: Diagnosis not present

## 2015-05-09 ENCOUNTER — Ambulatory Visit (HOSPITAL_COMMUNITY): Payer: Medicare Other | Admitting: Oncology

## 2015-05-13 ENCOUNTER — Other Ambulatory Visit (HOSPITAL_COMMUNITY): Payer: Self-pay | Admitting: Oncology

## 2015-05-13 ENCOUNTER — Encounter (HOSPITAL_COMMUNITY): Payer: Self-pay | Admitting: Oncology

## 2015-05-13 ENCOUNTER — Encounter (HOSPITAL_COMMUNITY): Payer: Medicare Other | Attending: Hematology & Oncology | Admitting: Oncology

## 2015-05-13 VITALS — BP 143/69 | HR 80 | Temp 98.0°F | Resp 18 | Wt 210.0 lb

## 2015-05-13 DIAGNOSIS — E1122 Type 2 diabetes mellitus with diabetic chronic kidney disease: Secondary | ICD-10-CM | POA: Insufficient documentation

## 2015-05-13 DIAGNOSIS — R590 Localized enlarged lymph nodes: Secondary | ICD-10-CM | POA: Insufficient documentation

## 2015-05-13 DIAGNOSIS — C8203 Follicular lymphoma grade I, intra-abdominal lymph nodes: Secondary | ICD-10-CM | POA: Diagnosis not present

## 2015-05-13 DIAGNOSIS — C8290 Follicular lymphoma, unspecified, unspecified site: Secondary | ICD-10-CM | POA: Insufficient documentation

## 2015-05-13 DIAGNOSIS — C8590 Non-Hodgkin lymphoma, unspecified, unspecified site: Secondary | ICD-10-CM | POA: Insufficient documentation

## 2015-05-13 DIAGNOSIS — N189 Chronic kidney disease, unspecified: Secondary | ICD-10-CM | POA: Insufficient documentation

## 2015-05-13 NOTE — Assessment & Plan Note (Addendum)
Stage IIX Non-Hodgkin's lymphoma, low-grade B cell lymphoma, CD20 and CD10 positive, on a core biopsy of a retroperitoneal mass 12/05/2014.  CT CAP on 05/06/2015 shows progression of disease.  I have messaged IR to help with re-biopsy to determine future options regarding treatment.  Oncology history is updated.  Rituxan Maintenance plan deleted.  Dr. Laurence Ferrari responded to my message and he is confident that a successful biopsy can take place from his perspective.  Order placed for a CT-guided biopsy.  Return in 1-2 week for follow-up, discussion of pathology results, and future treatment recommendations.

## 2015-05-13 NOTE — Progress Notes (Signed)
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  INTERVAL HISTORY: Billy Gray 69 y.o. male returns for followup of Stage IIX Non-Hodgkin's lymphoma, low-grade B cell lymphoma, CD20 and CD10 positive, on a core biopsy of a retroperitoneal mass 12/05/2014.    Lymphoma, follicular ([place])   0000000 Imaging CT CAP- Prominent retroperitoneal lymphadenopathy most likely to represent lymphoma.    12/05/2014 Pathology Results Soft Tissue Needle Core Biopsy, right retroperitoneal - FOLLICULAR LYMPHOMA. Low grade. lymphocytes are positive for CD20, CD79a, CD10, bcl-6, and bcl-2. 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   12/05/2014 Pathology Results Tissue-Flow Cytometry - MONOCLONAL B-CELL POPULATION IDENTIFIED.  There is a monoclonal population of B-cell with expression of CD10. 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   12/11/2014 - 04/08/2015 Chemotherapy Rituxan, cytoxan, prednisone x 6 cycles   02/10/2015 Imaging CT CAP- Interval response to therapy as evidenced by decrease in size of retroperitoneal and small bowel mesenteric adenopathy.   05/06/2015 Progression CT CAP- Significant disease progression within the abdomen and pelvis with enlarging retroperitoneal masses as described. The previously demonstrated dominant right periaortic lesion centered inferior to the renal veins has slightly decreased in size.    I personally reviewed and went over laboratory results with the patient.  The results are noted within this dictation.  I reviewed the patient's CT imaging.  Progression of disease is appreciated.   He denies any symptoms.  He denies any new complaints.  He reports that his appetite is strong.   Past Medical History  Diagnosis Date  . COPD  (chronic obstructive pulmonary disease) ([place])   . Hypertension   . Diabetes mellitus without complication ([place])   . Chronic kidney disease   . NHL (non-Hodgkin's lymphoma) (HCC)     has Acute on chronic kidney failure ([place]); Hypercalcemia; HTN (hypertension); DM2 (diabetes mellitus, type 2) ([place]); Retroperitoneal lymphadenopathy; Lymphoma, follicular ([place]); Chronic respiratory failure ([place]); COPD with acute exacerbation ([place]); Peripheral edema; Bacteremia; and Chronic respiratory failure with hypoxia ([place]) on his problem list.     has No Known Allergies.  Current Outpatient Prescriptions on File Prior to Visit  Medication Sig Dispense Refill  . acetaminophen (TYLENOL) 500 MG tablet Take 500 mg by mouth every 6 (six) hours as needed for moderate pain.    Marland Kitchen albuterol (PROVENTIL HFA;VENTOLIN HFA) 108 (90 BASE) MCG/ACT inhaler Inhale 2 puffs into the lungs every 6 (six) hours as needed for wheezing or shortness of breath.    Marland Kitchen amLODipine (NORVASC) 5 MG tablet Take 1 tablet (5 mg total) by mouth daily. 30 tablet 4  . budesonide-formoterol (SYMBICORT) 160-4.5 MCG/ACT inhaler Inhale 2 puffs into the lungs 2 (two) times daily.     . insulin glargine (LANTUS) 100 UNIT/ML injection Inject 0.35 mLs (35 Units total) into the skin at bedtime.    . pravastatin (PRAVACHOL) 40 MG tablet Take 40 mg by mouth every evening.    . tiotropium (SPIRIVA) 18 MCG inhalation capsule Place 18 mcg into inhaler and inhale 2 (two) times daily. 2 puffs 2 times daily    . zolpidem (AMBIEN) 10 MG tablet Take 10 mg by mouth at bedtime.    Marland Kitchen allopurinol (ZYLOPRIM) 100 MG tablet Take 1 tablet (100 mg total) by mouth daily. (Patient not taking: Reported on 05/13/2015) 30  tablet 0  . amoxicillin-clavulanate (AUGMENTIN) 875-125 MG tablet Take 1 tablet by mouth 2 (two) times daily. (Patient not taking: Reported on 05/13/2015) 23 tablet 0  . insulin lispro (HUMALOG) 100 UNIT/ML injection 151-200 = 2 units, 201-250 = 4 units, 251-300 =  6 units, 301 or greater = 8 units & call MD (Patient not taking: Reported on 05/13/2015) 10 mL 1  . predniSONE (DELTASONE) 10 MG tablet Take 20 mg by mouth daily for 3 days, then take 10 mg by mouth daily for 3 days, then stop. (Patient not taking: Reported on 05/13/2015) 9 tablet 0  . traZODone (DESYREL) 50 MG tablet Take 50 mg by mouth at bedtime.     No current facility-administered medications on file prior to visit.    Past Surgical History  Procedure Laterality Date  . Replacement total knee      right  . Appendectomy      Denies any headaches, dizziness, double vision, fevers, chills, night sweats, nausea, vomiting, diarrhea, constipation, chest pain, heart palpitations, worsened shortness of breath, blood in stool, black tarry stool, urinary pain, urinary burning, urinary frequency, hematuria.   PHYSICAL EXAMINATION  ECOG PERFORMANCE STATUS: 1 - Symptomatic but completely ambulatory  Filed Vitals:   05/13/15 0952  BP: 143/69  Pulse: 80  Temp: 98 F (36.7 C)  Resp: 18    GENERAL:alert, no distress, well nourished, well developed, comfortable, chronically ill appearing, cooperative and [place] in place with O2 delivery, accompanied SKIN: skin color, texture, turgor are normal, no rashes or significant lesions HEAD: Normocephalic, No masses, lesions, tenderness or abnormalities EYES: normal, PERRLA, EOMI, Conjunctiva are pink and non-injected EARS: External ears normal OROPHARYNX:lips, buccal mucosa, and tongue normal and mucous membranes are moist  NECK: supple, no adenopathy, thyroid normal size, non-tender, without nodularity, no stridor, non-tender, trachea midline LYMPH:  no palpable lymphadenopathy BREAST:not examined LUNGS: clear to auscultation , decreased breath sounds HEART: regular rate & rhythm, no murmurs, no gallops, S1 normal and S2 normal ABDOMEN:abdomen soft and non-tender BACK: Back symmetric, no curvature. EXTREMITIES:less then 2 second capillary refill,  no joint deformities, effusion, or inflammation, no skin discoloration, no cyanosis  NEURO: alert & oriented x 3 with fluent speech, no focal motor/sensory deficits, gait normal    LABORATORY DATA: CBC    Component Value Date/Time   WBC 4.5 04/01/2015 0835   WBC 8.3 01/02/2015 0959   RBC 4.13* 04/01/2015 0835   RBC 4.10* 01/02/2015 0959   HGB 11.9* 04/01/2015 0835   HGB 12.3* 01/02/2015 0959   HCT 36.4* 04/01/2015 0835   HCT 36.8* 01/02/2015 0959   PLT 182 04/01/2015 0835   PLT 258 01/02/2015 0959   MCV 88.1 04/01/2015 0835   MCV 89.8 01/02/2015 0959   MCH 28.8 04/01/2015 0835   MCH 30.0 01/02/2015 0959   MCHC 32.7 04/01/2015 0835   MCHC 33.4 01/02/2015 0959   RDW 13.9 04/01/2015 0835   RDW 13.3 01/02/2015 0959   LYMPHSABS 1.4 04/01/2015 0835   LYMPHSABS 1.9 01/02/2015 0959   MONOABS 0.9 04/01/2015 0835   MONOABS 1.3* 01/02/2015 0959   EOSABS 0.4 04/01/2015 0835   EOSABS 0.7* 01/02/2015 0959   BASOSABS 0.0 04/01/2015 0835   BASOSABS 0.1 01/02/2015 0959      Chemistry      Component Value Date/Time   NA 137 04/01/2015 0835   NA 140 01/02/2015 0959   K 4.0 04/01/2015 0835   K 4.3 01/02/2015 0959   CL 98* 04/01/2015 0835   CO2  33* 04/01/2015 0835   CO2 38* 01/02/2015 0959   BUN 17 04/01/2015 0835   BUN 11.6 01/02/2015 0959   CREATININE 1.78* 04/01/2015 0835   CREATININE 2.5* 01/02/2015 0959   GLU 203* 12/20/2014 1609      Component Value Date/Time   CALCIUM 8.0* 04/01/2015 0835   CALCIUM 10.1 01/02/2015 0959   CALCIUM 15.0* 12/03/2014 2104   ALKPHOS 58 04/01/2015 0835   ALKPHOS 104 01/02/2015 0959   AST 21 04/01/2015 0835   AST 11 01/02/2015 0959   ALT 24 04/01/2015 0835   ALT 9 01/02/2015 0959   BILITOT 0.4 04/01/2015 0835   BILITOT 0.28 01/02/2015 0959        PENDING LABS:   RADIOGRAPHIC STUDIES:  Ct Abdomen Pelvis Wo Contrast  05/06/2015  CLINICAL DATA:  Non-Hodgkin's lymphoma diagnosed 5 months ago. Chemotherapy ongoing. Restaging.  Subsequent encounter. EXAM: CT CHEST, ABDOMEN AND PELVIS WITHOUT CONTRAST TECHNIQUE: Multidetector CT imaging of the chest, abdomen and pelvis was performed following the standard protocol without IV contrast. COMPARISON:  CT 02/10/2015 and 12/03/2014. FINDINGS: CT CHEST FINDINGS Mediastinum/Nodes: There are no enlarged mediastinal, hilar or axillary lymph nodes. The thyroid gland, trachea and esophagus demonstrate no significant findings. The heart size is normal. There is no pericardial effusion. There is diffuse atherosclerosis of the aorta, great vessels and coronary arteries. Dilatation of the ascending aorta to approximately 4.1 cm is grossly unchanged. Lungs/Pleura: There is no pleural effusion.There is emphysema with chronic scarring in the right middle and lower lobes. There is a 3 mm right upper lobe subpleural nodule on image number 16 which appears unchanged. No suspicious pulmonary findings. Musculoskeletal/Chest wall: No suspicious chest wall mass or osseous abnormality. There is a probable epidermoid/sebaceous cyst in mid back which appears unchanged. CT ABDOMEN AND PELVIS FINDINGS Hepatobiliary: As evaluated in the noncontrast state, the liver appear stable without suspicious finding. No evidence of gallstones, gallbladder wall thickening or biliary dilatation. Pancreas: Unremarkable. No pancreatic ductal dilatation or surrounding inflammatory changes. Spleen: Normal in size without focal abnormality. Adrenals/Urinary Tract: Both adrenal glands appear normal. The kidneys appear unchanged. There is cortical thinning and multiple exophytic renal cysts, largest in the lower poles. These measure up to 8.7 cm on the right (image 86) and 9.0 cm on the left (image 83). The indeterminate intermediate density lesion in the interpolar region of the left kidney is unchanged in size, measuring 4.5 cm on image 73. No hydronephrosis. The bladder appears unremarkable. Stomach/Bowel: No evidence of bowel wall  thickening, distention or surrounding inflammatory change. Duodenal diverticulum noted. Vascular/Lymphatic: The dominant retroperitoneal mass remains ill-defined, but subjectively smaller, measuring approximately 7.2 x 5.3 cm on image 78 (previously 7.4 x 5.4 cm). However, there are several enlarging lesions, including a 1.6 cm left periaortic node on image 75, a 9.6 x 5.0 cm lobulated soft tissue mass within the right posterior para renal space, lateral to the psoas muscle (image 75), and a left external iliac nodal mass measuring 4.7 x 3.3 cm on image 99. Diffuse atherosclerosis of the aorta, its branches and the iliac arteries noted. No definite signs of DVT on noncontrast imaging. Reproductive: The prostate gland and seminal vesicles appear unchanged. Other: No ascites or peritoneal nodularity identified. Stable asymmetric fat in the left inguinal canal. Musculoskeletal: No acute or significant osseous findings. Mild spondylosis appears unchanged. IMPRESSION: 1. Significant disease progression within the abdomen and pelvis with enlarging retroperitoneal masses as described. The previously demonstrated dominant right periaortic lesion centered inferior to the renal veins  has slightly decreased in size. 2. No active disease identified within the chest. 3. No evidence of hydronephrosis. 4. Stable appearance of the kidneys with multiple low-density cysts bilaterally. There is a stable indeterminate intermediate density lesion in the interpolar region of the left kidney. Electronically Signed   By: Richardean Sale M.D.   On: 05/06/2015 12:18   Ct Chest Wo Contrast  05/06/2015  CLINICAL DATA:  Non-Hodgkin's lymphoma diagnosed 5 months ago. Chemotherapy ongoing. Restaging. Subsequent encounter. EXAM: CT CHEST, ABDOMEN AND PELVIS WITHOUT CONTRAST TECHNIQUE: Multidetector CT imaging of the chest, abdomen and pelvis was performed following the standard protocol without IV contrast. COMPARISON:  CT 02/10/2015 and  12/03/2014. FINDINGS: CT CHEST FINDINGS Mediastinum/Nodes: There are no enlarged mediastinal, hilar or axillary lymph nodes. The thyroid gland, trachea and esophagus demonstrate no significant findings. The heart size is normal. There is no pericardial effusion. There is diffuse atherosclerosis of the aorta, great vessels and coronary arteries. Dilatation of the ascending aorta to approximately 4.1 cm is grossly unchanged. Lungs/Pleura: There is no pleural effusion.There is emphysema with chronic scarring in the right middle and lower lobes. There is a 3 mm right upper lobe subpleural nodule on image number 16 which appears unchanged. No suspicious pulmonary findings. Musculoskeletal/Chest wall: No suspicious chest wall mass or osseous abnormality. There is a probable epidermoid/sebaceous cyst in mid back which appears unchanged. CT ABDOMEN AND PELVIS FINDINGS Hepatobiliary: As evaluated in the noncontrast state, the liver appear stable without suspicious finding. No evidence of gallstones, gallbladder wall thickening or biliary dilatation. Pancreas: Unremarkable. No pancreatic ductal dilatation or surrounding inflammatory changes. Spleen: Normal in size without focal abnormality. Adrenals/Urinary Tract: Both adrenal glands appear normal. The kidneys appear unchanged. There is cortical thinning and multiple exophytic renal cysts, largest in the lower poles. These measure up to 8.7 cm on the right (image 86) and 9.0 cm on the left (image 83). The indeterminate intermediate density lesion in the interpolar region of the left kidney is unchanged in size, measuring 4.5 cm on image 73. No hydronephrosis. The bladder appears unremarkable. Stomach/Bowel: No evidence of bowel wall thickening, distention or surrounding inflammatory change. Duodenal diverticulum noted. Vascular/Lymphatic: The dominant retroperitoneal mass remains ill-defined, but subjectively smaller, measuring approximately 7.2 x 5.3 cm on image 78  (previously 7.4 x 5.4 cm). However, there are several enlarging lesions, including a 1.6 cm left periaortic node on image 75, a 9.6 x 5.0 cm lobulated soft tissue mass within the right posterior para renal space, lateral to the psoas muscle (image 75), and a left external iliac nodal mass measuring 4.7 x 3.3 cm on image 99. Diffuse atherosclerosis of the aorta, its branches and the iliac arteries noted. No definite signs of DVT on noncontrast imaging. Reproductive: The prostate gland and seminal vesicles appear unchanged. Other: No ascites or peritoneal nodularity identified. Stable asymmetric fat in the left inguinal canal. Musculoskeletal: No acute or significant osseous findings. Mild spondylosis appears unchanged. IMPRESSION: 1. Significant disease progression within the abdomen and pelvis with enlarging retroperitoneal masses as described. The previously demonstrated dominant right periaortic lesion centered inferior to the renal veins has slightly decreased in size. 2. No active disease identified within the chest. 3. No evidence of hydronephrosis. 4. Stable appearance of the kidneys with multiple low-density cysts bilaterally. There is a stable indeterminate intermediate density lesion in the interpolar region of the left kidney. Electronically Signed   By: Richardean Sale M.D.   On: 05/06/2015 12:18     PATHOLOGY:  ASSESSMENT AND PLAN:  Lymphoma, follicular Stage IIX Non-Hodgkin's lymphoma, low-grade B cell lymphoma, CD20 and CD10 positive, on a core biopsy of a retroperitoneal mass 12/05/2014.  CT CAP on 05/06/2015 shows progression of disease.  I have messaged IR to help with re-biopsy to determine future options regarding treatment.  Oncology history is updated.  Rituxan Maintenance plan deleted.  Dr. Laurence Ferrari responded to my message and he is confident that a successful biopsy can take place from his perspective.  Order placed for a CT-guided biopsy.  Return in 1-2 week for  follow-up, discussion of pathology results, and future treatment recommendations.      THERAPY PLAN:  Unfortunately, progression of disease is noted radiographically.  We will need to ascertain a biopsy to see what has changed regarding his malignancy and treat accordingly.  All questions were answered. The patient knows to call the clinic with any problems, questions or concerns. We can certainly see the patient much sooner if necessary.  Patient and plan discussed with Dr. Ancil Linsey and she is in agreement with the aforementioned.   This note is electronically signed by: Doy Mince 05/13/2015 10:27 AM

## 2015-05-13 NOTE — Patient Instructions (Signed)
[place] at [facility] Discharge Instructions  RECOMMENDATIONS MADE BY THE CONSULTANT AND ANY TEST RESULTS WILL BE SENT TO YOUR REFERRING PHYSICIAN.  Exam and discussion by Robynn Pane, PA-C Scan shows disease progression and we will get you scheduled for CT guided biopsy.  They will call you with the date and time of your procedure. No labs today. Lendell Caprice will call you tomorrow regarding [facility] information.  Will see you after biopsy.   Thank you for choosing [place] at [facility] to provide your oncology and hematology care.  To afford each patient quality time with our provider, please arrive at least 15 minutes before your scheduled appointment time.    You need to re-schedule your appointment should you arrive 10 or more minutes late.  We strive to give you quality time with our providers, and arriving late affects you and other patients whose appointments are after yours.  Also, if you no show three or more times for appointments you may be dismissed from the clinic at the providers discretion.     Again, thank you for choosing [facility] [place].  Our hope is that these requests will decrease the amount of time that you wait before being seen by our physicians.       _____________________________________________________________  Should you have questions after your visit to [facility], please contact our office at [phone] between the hours of 8:30 a.m. and 4:30 p.m.  Voicemails left after 4:30 p.m. will not be returned until the following business day.  For prescription refill requests, have your pharmacy contact our office.

## 2015-05-14 ENCOUNTER — Other Ambulatory Visit: Payer: Self-pay | Admitting: Radiology

## 2015-05-16 ENCOUNTER — Other Ambulatory Visit: Payer: Self-pay | Admitting: Radiology

## 2015-05-19 ENCOUNTER — Encounter (HOSPITAL_COMMUNITY): Payer: Self-pay

## 2015-05-19 ENCOUNTER — Ambulatory Visit (HOSPITAL_COMMUNITY)
Admission: RE | Admit: 2015-05-19 | Discharge: 2015-05-19 | Disposition: A | Discharge status: Home / Self Care | Payer: Medicare Other | Source: Ambulatory Visit | Attending: Oncology | Admitting: Oncology

## 2015-05-19 DIAGNOSIS — R1900 Intra-abdominal and pelvic swelling, mass and lump, unspecified site: Secondary | ICD-10-CM | POA: Insufficient documentation

## 2015-05-19 DIAGNOSIS — C8333 Diffuse large B-cell lymphoma, intra-abdominal lymph nodes: Secondary | ICD-10-CM | POA: Diagnosis not present

## 2015-05-19 DIAGNOSIS — C8203 Follicular lymphoma grade I, intra-abdominal lymph nodes: Secondary | ICD-10-CM

## 2015-05-19 DIAGNOSIS — R590 Localized enlarged lymph nodes: Secondary | ICD-10-CM

## 2015-05-19 LAB — CBC
HCT: 38.3 % — ABNORMAL LOW (ref 39.0–52.0)
Hemoglobin: 13.1 g/dL (ref 13.0–17.0)
MCH: 27.9 pg (ref 26.0–34.0)
MCHC: 34.2 g/dL (ref 30.0–36.0)
MCV: 81.7 fL (ref 78.0–100.0)
PLATELETS: 216 10*3/uL (ref 150–400)
RBC: 4.69 MIL/uL (ref 4.22–5.81)
RDW: 13.2 % (ref 11.5–15.5)
WBC: 4.9 10*3/uL (ref 4.0–10.5)

## 2015-05-19 LAB — PROTIME-INR
INR: 1.08 (ref 0.00–1.49)
Prothrombin Time: 14.2 seconds (ref 11.6–15.2)

## 2015-05-19 LAB — APTT: APTT: 30 s (ref 24–37)

## 2015-05-19 MED ORDER — MIDAZOLAM HCL 2 MG/2ML IJ SOLN
INTRAMUSCULAR | Status: AC | PRN
  Administered 2015-05-19: 1 mg via INTRAVENOUS

## 2015-05-19 MED ORDER — FENTANYL CITRATE (PF) 100 MCG/2ML IJ SOLN
INTRAMUSCULAR | Status: AC
  Filled 2015-05-19: qty 2

## 2015-05-19 MED ORDER — SODIUM CHLORIDE 0.9 % IV SOLN: Freq: Once | INTRAVENOUS | Status: DC

## 2015-05-19 MED ORDER — LIDOCAINE HCL 1 % IJ SOLN
INTRAMUSCULAR | Status: AC
  Filled 2015-05-19: qty 20

## 2015-05-19 MED ORDER — MIDAZOLAM HCL 2 MG/2ML IJ SOLN
INTRAMUSCULAR | Status: AC
  Filled 2015-05-19: qty 2

## 2015-05-19 MED ORDER — FENTANYL CITRATE (PF) 100 MCG/2ML IJ SOLN
INTRAMUSCULAR | Status: AC | PRN
  Administered 2015-05-19: 50 ug via INTRAVENOUS

## 2015-05-19 NOTE — Discharge Instructions (Signed)
Needle Biopsy, Care After °Refer to this sheet in the next few weeks. These instructions provide you with information about caring for yourself after your procedure. Your health care provider may also give you more specific instructions. Your treatment has been planned according to current medical practices, but problems sometimes occur. Call your health care provider if you have any problems or questions after your procedure. °WHAT TO EXPECT AFTER THE PROCEDURE °After your procedure, it is common to have soreness, bruising, or mild pain at the biopsy site. This should go away in a few days. °HOME CARE INSTRUCTIONS °· Rest as directed by your health care provider. °· Take medicines only as directed by your health care provider. °· There are many different ways to close and cover the biopsy site, including stitches (sutures), skin glue, and adhesive strips. Follow your health care provider's instructions about: °¨ Biopsy site care. °¨ Bandage (dressing) changes and removal. °¨ Biopsy site closure removal. °· Check your biopsy site every day for signs of infection. Watch for: °¨ Redness, swelling, or pain. °¨ Fluid, blood, or pus. °SEEK MEDICAL CARE IF: °· You have a fever. °· You have redness, swelling, or pain at the biopsy site that lasts longer than a few days. °· You have fluid, blood, or pus coming from the biopsy site. °· You feel nauseous. °· You vomit. °SEEK IMMEDIATE MEDICAL CARE IF: °· You have shortness of breath. °· You have trouble breathing. °· You have chest pain.   °· You feel dizzy or you faint. °· You have bleeding that does not stop with pressure or a bandage. °· You cough up blood. °· You have pain in your abdomen. °  °This information is not intended to replace advice given to you by your health care provider. Make sure you discuss any questions you have with your health care provider. °  °Document Released: 10/22/2014 Document Reviewed: 10/22/2014 °Elsevier Interactive Patient Education ©2016  [employer] ° °

## 2015-05-19 NOTE — H&P (Signed)
Chief Complaint: Patient was seen in consultation today for biopsy of retroperitoneal mass at the request of Fenton S  Referring Physician(s): Kefalas,Thomas S  History of Present Illness: Billy Gray is a 69 y.o. male with known hx of lymphoma by prior biopsy. Has undergone treatment and now has enlarging retroperitoneal mass concerning for disease recurrence/progression/transformation. Biopsy is requested. PMHx, meds, labs, imaging reviewed. Has been NPO this am No complaints   Past Medical History  Diagnosis Date  . COPD (chronic obstructive pulmonary disease) ([place])   . Hypertension   . Diabetes mellitus without complication ([place])   . Chronic kidney disease   . NHL (non-Hodgkin's lymphoma) [facility])     Past Surgical History  Procedure Laterality Date  . Replacement total knee      right  . Appendectomy      Allergies: Review of patient's allergies indicates no known allergies.  Medications: Prior to Admission medications   Medication Sig Start Date End Date Taking? Authorizing Provider  amLODipine (NORVASC) 5 MG tablet Take 1 tablet (5 mg total) by mouth daily. 01/08/15  Yes Patrici Ranks, MD  budesonide-formoterol (SYMBICORT) 160-4.5 MCG/ACT inhaler Inhale 2 puffs into the lungs 2 (two) times daily.    Yes Historical Provider, MD  cholecalciferol (VITAMIN D) 1000 UNITS tablet Take 1,000 Units by mouth daily.   Yes Historical Provider, MD  insulin glargine (LANTUS) 100 UNIT/ML injection Inject 0.35 mLs (35 Units total) into the skin at bedtime. 03/22/15  Yes Samuella Cota, MD  pravastatin (PRAVACHOL) 40 MG tablet Take 40 mg by mouth every evening.   Yes Historical Provider, MD  tamsulosin (FLOMAX) 0.4 MG CAPS capsule Take 0.4 mg by mouth at bedtime.   Yes Historical Provider, MD  tiotropium (SPIRIVA) 18 MCG inhalation capsule Place 18 mcg into inhaler and inhale daily. 2 puffs daily   Yes Historical Provider, MD  zolpidem (AMBIEN) 10 MG tablet Take 10  mg by mouth at bedtime as needed for sleep.    Yes Historical Provider, MD  acetaminophen (TYLENOL) 500 MG tablet Take 500 mg by mouth every 6 (six) hours as needed for moderate pain.    Historical Provider, MD  albuterol (PROVENTIL HFA;VENTOLIN HFA) 108 (90 BASE) MCG/ACT inhaler Inhale 2 puffs into the lungs every 6 (six) hours as needed for wheezing or shortness of breath.    Historical Provider, MD     Family History  Problem Relation Age of Onset  . Diabetes Mother   . Diabetes Father   . Cancer Brother   . Diabetes Brother     Social History   Social History  . Marital Status: Married    Spouse Name: N/A  . Number of Children: N/A  . Years of Education: N/A   Social History Main Topics  . Smoking status: Former Smoker    Quit date: 09/07/2001  . Smokeless tobacco: None  . Alcohol Use: No  . Drug Use: No  . Sexual Activity: Not Asked   Other Topics Concern  . None   Social History Narrative     Review of Systems: A 12 point ROS discussed and pertinent positives are indicated in the HPI above.  All other systems are negative.  Review of Systems  Vital Signs: BP 176/87 mmHg  Pulse 79  Temp(Src) 97.8 F (36.6 C)  Resp 20  SpO2 100%  Physical Exam  Constitutional: He is oriented to person, place, and time. He appears well-developed and well-nourished. No distress.  HENT:  Head:  Normocephalic.  Mouth/Throat: Oropharynx is clear and moist.  Neck: Normal range of motion. No JVD present. No tracheal deviation present.  Cardiovascular: Normal rate, regular rhythm and normal heart sounds.   Pulmonary/Chest: Effort normal and breath sounds normal. No respiratory distress.  Abdominal: Soft. Bowel sounds are normal. He exhibits no mass. There is no tenderness.  Neurological: He is alert and oriented to person, place, and time.  Psychiatric: He has a normal mood and affect. Judgment normal.    Mallampati Score:  MD Evaluation Airway: WNL Heart: WNL Abdomen:  WNL Chest/ Lungs: WNL ASA  Classification: 2 Mallampati/Airway Score: Two  Imaging: Ct Abdomen Pelvis Wo Contrast  05/06/2015  CLINICAL DATA:  Non-Hodgkin's lymphoma diagnosed 5 months ago. Chemotherapy ongoing. Restaging. Subsequent encounter. EXAM: CT CHEST, ABDOMEN AND PELVIS WITHOUT CONTRAST TECHNIQUE: Multidetector CT imaging of the chest, abdomen and pelvis was performed following the standard protocol without IV contrast. COMPARISON:  CT 02/10/2015 and 12/03/2014. FINDINGS: CT CHEST FINDINGS Mediastinum/Nodes: There are no enlarged mediastinal, hilar or axillary lymph nodes. The thyroid gland, trachea and esophagus demonstrate no significant findings. The heart size is normal. There is no pericardial effusion. There is diffuse atherosclerosis of the aorta, great vessels and coronary arteries. Dilatation of the ascending aorta to approximately 4.1 cm is grossly unchanged. Lungs/Pleura: There is no pleural effusion.There is emphysema with chronic scarring in the right middle and lower lobes. There is a 3 mm right upper lobe subpleural nodule on image number 16 which appears unchanged. No suspicious pulmonary findings. Musculoskeletal/Chest wall: No suspicious chest wall mass or osseous abnormality. There is a probable epidermoid/sebaceous cyst in mid back which appears unchanged. CT ABDOMEN AND PELVIS FINDINGS Hepatobiliary: As evaluated in the noncontrast state, the liver appear stable without suspicious finding. No evidence of gallstones, gallbladder wall thickening or biliary dilatation. Pancreas: Unremarkable. No pancreatic ductal dilatation or surrounding inflammatory changes. Spleen: Normal in size without focal abnormality. Adrenals/Urinary Tract: Both adrenal glands appear normal. The kidneys appear unchanged. There is cortical thinning and multiple exophytic renal cysts, largest in the lower poles. These measure up to 8.7 cm on the right (image 86) and 9.0 cm on the left (image 83). The  indeterminate intermediate density lesion in the interpolar region of the left kidney is unchanged in size, measuring 4.5 cm on image 73. No hydronephrosis. The bladder appears unremarkable. Stomach/Bowel: No evidence of bowel wall thickening, distention or surrounding inflammatory change. Duodenal diverticulum noted. Vascular/Lymphatic: The dominant retroperitoneal mass remains ill-defined, but subjectively smaller, measuring approximately 7.2 x 5.3 cm on image 78 (previously 7.4 x 5.4 cm). However, there are several enlarging lesions, including a 1.6 cm left periaortic node on image 75, a 9.6 x 5.0 cm lobulated soft tissue mass within the right posterior para renal space, lateral to the psoas muscle (image 75), and a left external iliac nodal mass measuring 4.7 x 3.3 cm on image 99. Diffuse atherosclerosis of the aorta, its branches and the iliac arteries noted. No definite signs of DVT on noncontrast imaging. Reproductive: The prostate gland and seminal vesicles appear unchanged. Other: No ascites or peritoneal nodularity identified. Stable asymmetric fat in the left inguinal canal. Musculoskeletal: No acute or significant osseous findings. Mild spondylosis appears unchanged. IMPRESSION: 1. Significant disease progression within the abdomen and pelvis with enlarging retroperitoneal masses as described. The previously demonstrated dominant right periaortic lesion centered inferior to the renal veins has slightly decreased in size. 2. No active disease identified within the chest. 3. No evidence of hydronephrosis. 4. Stable  appearance of the kidneys with multiple low-density cysts bilaterally. There is a stable indeterminate intermediate density lesion in the interpolar region of the left kidney. Electronically Signed   By: Richardean Sale M.D.   On: 05/06/2015 12:18   Ct Chest Wo Contrast  05/06/2015  CLINICAL DATA:  Non-Hodgkin's lymphoma diagnosed 5 months ago. Chemotherapy ongoing. Restaging. Subsequent  encounter. EXAM: CT CHEST, ABDOMEN AND PELVIS WITHOUT CONTRAST TECHNIQUE: Multidetector CT imaging of the chest, abdomen and pelvis was performed following the standard protocol without IV contrast. COMPARISON:  CT 02/10/2015 and 12/03/2014. FINDINGS: CT CHEST FINDINGS Mediastinum/Nodes: There are no enlarged mediastinal, hilar or axillary lymph nodes. The thyroid gland, trachea and esophagus demonstrate no significant findings. The heart size is normal. There is no pericardial effusion. There is diffuse atherosclerosis of the aorta, great vessels and coronary arteries. Dilatation of the ascending aorta to approximately 4.1 cm is grossly unchanged. Lungs/Pleura: There is no pleural effusion.There is emphysema with chronic scarring in the right middle and lower lobes. There is a 3 mm right upper lobe subpleural nodule on image number 16 which appears unchanged. No suspicious pulmonary findings. Musculoskeletal/Chest wall: No suspicious chest wall mass or osseous abnormality. There is a probable epidermoid/sebaceous cyst in mid back which appears unchanged. CT ABDOMEN AND PELVIS FINDINGS Hepatobiliary: As evaluated in the noncontrast state, the liver appear stable without suspicious finding. No evidence of gallstones, gallbladder wall thickening or biliary dilatation. Pancreas: Unremarkable. No pancreatic ductal dilatation or surrounding inflammatory changes. Spleen: Normal in size without focal abnormality. Adrenals/Urinary Tract: Both adrenal glands appear normal. The kidneys appear unchanged. There is cortical thinning and multiple exophytic renal cysts, largest in the lower poles. These measure up to 8.7 cm on the right (image 86) and 9.0 cm on the left (image 83). The indeterminate intermediate density lesion in the interpolar region of the left kidney is unchanged in size, measuring 4.5 cm on image 73. No hydronephrosis. The bladder appears unremarkable. Stomach/Bowel: No evidence of bowel wall thickening,  distention or surrounding inflammatory change. Duodenal diverticulum noted. Vascular/Lymphatic: The dominant retroperitoneal mass remains ill-defined, but subjectively smaller, measuring approximately 7.2 x 5.3 cm on image 78 (previously 7.4 x 5.4 cm). However, there are several enlarging lesions, including a 1.6 cm left periaortic node on image 75, a 9.6 x 5.0 cm lobulated soft tissue mass within the right posterior para renal space, lateral to the psoas muscle (image 75), and a left external iliac nodal mass measuring 4.7 x 3.3 cm on image 99. Diffuse atherosclerosis of the aorta, its branches and the iliac arteries noted. No definite signs of DVT on noncontrast imaging. Reproductive: The prostate gland and seminal vesicles appear unchanged. Other: No ascites or peritoneal nodularity identified. Stable asymmetric fat in the left inguinal canal. Musculoskeletal: No acute or significant osseous findings. Mild spondylosis appears unchanged. IMPRESSION: 1. Significant disease progression within the abdomen and pelvis with enlarging retroperitoneal masses as described. The previously demonstrated dominant right periaortic lesion centered inferior to the renal veins has slightly decreased in size. 2. No active disease identified within the chest. 3. No evidence of hydronephrosis. 4. Stable appearance of the kidneys with multiple low-density cysts bilaterally. There is a stable indeterminate intermediate density lesion in the interpolar region of the left kidney. Electronically Signed   By: Richardean Sale M.D.   On: 05/06/2015 12:18    Labs:  CBC:  Recent Labs  03/12/15 0930 03/20/15 0545 04/01/15 0835 05/19/15 0740  WBC 7.6 5.6 4.5 4.9  HGB 12.9* 11.9*  11.9* 13.1  HCT 39.2 37.0* 36.4* 38.3*  PLT 297 219 182 216    COAGS:  Recent Labs  12/05/14 0622  INR 1.19  APTT 31    Assessment and Plan: Hx Lymphoma Retroperitoneal mass For CT guided biopsy today Labs reviewed. Risks and Benefits  discussed with the patient including, but not limited to bleeding, infection, damage to adjacent structures or low yield requiring additional tests. All of the patient's questions were answered, patient is agreeable to proceed. Consent signed and in chart.      Thank you for this interesting consult.  I greatly enjoyed meeting [facility] and look forward to participating in their care.  A copy of this report was sent to the requesting provider on this date.  SignedAscencion Dike 05/19/2015, 8:11 AM   I spent a total of 20 minutes in face to face in clinical consultation, greater than 50% of which was counseling/coordinating care for biopsy.

## 2015-05-19 NOTE — Procedures (Signed)
R retroperitoneal Bx 18 g core times four No comp/EBL

## 2015-05-22 ENCOUNTER — Other Ambulatory Visit (HOSPITAL_COMMUNITY): Payer: Self-pay | Admitting: *Deleted

## 2015-05-22 ENCOUNTER — Other Ambulatory Visit (HOSPITAL_COMMUNITY): Payer: Self-pay | Admitting: Oncology

## 2015-05-22 DIAGNOSIS — C8203 Follicular lymphoma grade I, intra-abdominal lymph nodes: Secondary | ICD-10-CM

## 2015-05-22 DIAGNOSIS — C8330 Diffuse large B-cell lymphoma, unspecified site: Secondary | ICD-10-CM

## 2015-05-22 MED ORDER — TRAMADOL HCL 50 MG PO TABS: 50.0000 mg | ORAL_TABLET | Freq: Four times a day (QID) | ORAL | Status: DC | PRN

## 2015-05-23 ENCOUNTER — Other Ambulatory Visit (HOSPITAL_COMMUNITY): Payer: Self-pay | Admitting: *Deleted

## 2015-05-23 ENCOUNTER — Other Ambulatory Visit (HOSPITAL_COMMUNITY): Payer: Self-pay | Admitting: Oncology

## 2015-05-23 DIAGNOSIS — C8330 Diffuse large B-cell lymphoma, unspecified site: Secondary | ICD-10-CM

## 2015-05-23 MED ORDER — HYDROCODONE-ACETAMINOPHEN 10-325 MG PO TABS: ORAL_TABLET | ORAL | Status: DC

## 2015-05-23 MED ORDER — HYDROCODONE-ACETAMINOPHEN 10-325 MG PO TABS: ORAL_TABLET | ORAL | Status: AC

## 2015-05-26 MED ORDER — ONDANSETRON HCL 8 MG PO TABS: 8.0000 mg | ORAL_TABLET | Freq: Three times a day (TID) | ORAL | Status: AC | PRN

## 2015-05-26 MED ORDER — LIDOCAINE-PRILOCAINE 2.5-2.5 % EX CREA: TOPICAL_CREAM | CUTANEOUS | Status: AC

## 2015-05-26 MED ORDER — PROCHLORPERAZINE MALEATE 10 MG PO TABS: 10.0000 mg | ORAL_TABLET | Freq: Four times a day (QID) | ORAL | Status: DC | PRN

## 2015-05-26 NOTE — Progress Notes (Signed)
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 male is here for follow-up of his NHL. Restaging studies on 11/15 showed significant disease progression within the abdomen and pelvis.  He underwent a biopsy on 05/19/2015 that unfortunately shows a DLBCL. CD 20 + with Ki-67 at 70%.  He has been scheduled for BMBX, port placement and ECHO. He is here today for additional discussion on how to proceed. In addition for additional education on DLBCL. He has questions regarding "where it came from." He has had increased LBP and RLQ abdominal pain over the past few weeks. He has pain medication but uses it sparingly as it makes him feel woozy.  MEDICAL HISTORY:  Past Medical History  Diagnosis Date  . COPD (chronic obstructive pulmonary disease) ([place])   . Hypertension   . Diabetes mellitus without complication ([place])   . Chronic kidney disease   . NHL (non-Hodgkin's lymphoma) ([place])     SURGICAL HISTORY: Past Surgical History  Procedure Laterality Date  . Replacement total knee      right  . Appendectomy      SOCIAL HISTORY: Social History   Social History  . Marital Status: Married    Spouse Name: N/A  . Number  of Children: N/A  . Years of Education: N/A   Occupational History  . Not on file.   Social History Main Topics  . Smoking status: Former Smoker    Quit date: 09/07/2001  . Smokeless tobacco: Not on file  . Alcohol Use: No  . Drug Use: No  . Sexual Activity: Not on file   Other Topics Concern  . Not on file   Social History Narrative    FAMILY HISTORY: Family History  Problem Relation Age of Onset  . Diabetes Mother   . Diabetes Father   . Cancer Brother   . Diabetes Brother    indicated that his mother is deceased. He indicated that his father is deceased.   ALLERGIES:  has No Known Allergies.  MEDICATIONS:  Current Outpatient Prescriptions  Medication Sig Dispense Refill  . albuterol (PROVENTIL HFA;VENTOLIN HFA) 108 (90 BASE) MCG/ACT inhaler Inhale 2 puffs into the lungs every 6 (six) hours as needed for wheezing or shortness of breath.    Marland Kitchen amLODipine (NORVASC) 5 MG tablet Take 1 tablet (5 mg total) by mouth daily. 30 tablet 4  . budesonide-formoterol (SYMBICORT) 160-4.5 MCG/ACT inhaler Inhale 2 puffs into the lungs 2 (two) times daily.     . cholecalciferol (VITAMIN D) 1000 UNITS tablet Take 1,000 Units by mouth daily.    Marland Kitchen HYDROcodone-acetaminophen (NORCO) 10-325 MG tablet May take 1/2 tab or 1 tab every 6 hours prn pain. 60 tablet 0  . insulin glargine (LANTUS) 100 UNIT/ML injection Inject 0.35 mLs (35 Units total) into the skin at bedtime.    Marland Kitchen  lidocaine-prilocaine (EMLA) cream Apply a quarter size amount to port site 1 hour prior to chemo. Do not rub in. Cover with plastic wrap. 30 g 3  . pravastatin (PRAVACHOL) 40 MG tablet Take 40 mg by mouth every evening.    . tamsulosin (FLOMAX) 0.4 MG CAPS capsule Take 0.4 mg by mouth at bedtime.    Marland Kitchen tiotropium (SPIRIVA) 18 MCG inhalation capsule Place 18 mcg into inhaler and inhale daily. 2 puffs daily    . traMADol (ULTRAM) 50 MG tablet Take 1 tablet (50 mg total) by mouth every 6 (six) hours as needed. 60 tablet 1  .  zolpidem (AMBIEN) 10 MG tablet Take 10 mg by mouth at bedtime as needed for sleep.     Marland Kitchen acetaminophen (TYLENOL) 500 MG tablet Take 500 mg by mouth every 6 (six) hours as needed for moderate pain.    Marland Kitchen allopurinol (ZYLOPRIM) 300 MG tablet Take 150 mg by mouth daily.    . citalopram (CELEXA) 40 MG tablet Take 40 mg by mouth daily.    . Cyclophosphamide (CYTOXAN IJ) Inject as directed every 21 ( twenty-one) days. To begin 05/30/15    . DOXOrubicin HCl (ADRIAMYCIN IV) Inject into the vein every 21 ( twenty-one) days. To begin 05/30/15    . insulin lispro (HUMALOG) 100 UNIT/ML injection Inject into the skin. Check blood sugar three times a day and administer Humalog insulin as ordered: 151-200 = 2 units,   201-250 = 4 units,      251-300 = 6 units,   Greater than or equal to 301 = 8 units and call MD    . ondansetron (ZOFRAN) 8 MG tablet Take 1 tablet (8 mg total) by mouth every 8 (eight) hours as needed for nausea or vomiting. (Patient not taking: Reported on 05/27/2015) 30 tablet 2  . Pegfilgrastim (NEULASTA ONPRO Deputy) Inject into the skin every 21 ( twenty-one) days. To be applied after the completion of chemo    . predniSONE (DELTASONE) 20 MG tablet On Days 1-5 of chemo take 3 tabs (47m total). 45 tablet 1  . prochlorperazine (COMPAZINE) 10 MG tablet Take 1 tablet (10 mg total) by mouth every 6 (six) hours as needed for nausea or vomiting. (Patient not taking: Reported on 05/27/2015) 30 tablet 2  . RiTUXimab (RITUXAN IV) Inject into the vein every 21 ( twenty-one) days. To begin 05/30/15    . VINCRISTINE SULFATE IV Inject into the vein every 21 ( twenty-one) days. To begin 05/30/15     No current facility-administered medications for this visit.    Review of Systems  All other systems reviewed and are negative. 14 point ROS was done and is otherwise as detailed above or in HPI  PHYSICAL EXAMINATION: ECOG PERFORMANCE STATUS: 1 - Symptomatic but completely ambulatory  Filed Vitals:   05/27/15 0836    BP: 158/68  Pulse: 88  Temp: 98.4 F (36.9 C)  Resp: 18   Filed Weights   05/27/15 0836  Weight: 207 lb (93.895 kg)    Physical Exam  Constitutional: He is oriented to person, place, and time and well-developed, well-nourished, and in no distress. Lying in hospital bed. Talkative Wearing O2  HENT:  Head: Normocephalic and atraumatic.  Nose: Nose normal.  Mouth/Throat: Oropharynx is clear and moist. No oropharyngeal exudate.  Eyes: Conjunctivae and EOM are normal. Pupils are equal, round, and reactive to light. Right eye exhibits no discharge. Left eye exhibits no discharge. No scleral icterus.  Neck: Normal range of motion.  Neck supple. No tracheal deviation present. No thyromegaly present.  Cardiovascular: Normal rate, regular rhythm and normal heart sounds.  Exam reveals no gallop and no friction rub.   No murmur heard. Pulmonary/Chest: Effort normal and breath sounds normal. He has no wheezes. He has no rales.  Abdominal: Soft. Bowel sounds are normal. He exhibits no distension and no mass. There is no tenderness. There is no rebound and no guarding.  obese  Musculoskeletal: Normal range of motion. He exhibits no edema.  Lymphadenopathy:    He has no cervical adenopathy.  Neurological: He is alert and oriented to person, place, and time. He has normal reflexes. No cranial nerve deficit. Gait normal. Coordination normal.  Skin: Skin is warm and dry. No rash noted.  Psychiatric: Mood, memory, affect and judgment normal.  Nursing note and vitals reviewed.    LABORATORY DATA:  I have reviewed the data as listed Lab Results  Component Value Date   WBC 4.9 05/19/2015   HGB 13.1 05/19/2015   HCT 38.3* 05/19/2015   MCV 81.7 05/19/2015   PLT 216 05/19/2015   CMP     Component Value Date/Time   NA 137 04/01/2015 0835   NA 140 01/02/2015 0959   K 4.0 04/01/2015 0835   K 4.3 01/02/2015 0959   CL 98* 04/01/2015 0835   CO2 33* 04/01/2015 0835   CO2 38* 01/02/2015 0959    GLUCOSE 110* 04/01/2015 0835   GLUCOSE 219* 01/02/2015 0959   BUN 17 04/01/2015 0835   BUN 11.6 01/02/2015 0959   CREATININE 1.78* 04/01/2015 0835   CREATININE 2.5* 01/02/2015 0959   CALCIUM 8.0* 04/01/2015 0835   CALCIUM 10.1 01/02/2015 0959   CALCIUM 15.0* 12/03/2014 2104   PROT 5.3* 04/01/2015 0835   PROT 6.0* 01/02/2015 0959   ALBUMIN 2.7* 04/01/2015 0835   ALBUMIN 2.9* 01/02/2015 0959   AST 21 04/01/2015 0835   AST 11 01/02/2015 0959   ALT 24 04/01/2015 0835   ALT 9 01/02/2015 0959   ALKPHOS 58 04/01/2015 0835   ALKPHOS 104 01/02/2015 0959   BILITOT 0.4 04/01/2015 0835   BILITOT 0.28 01/02/2015 0959   GFRNONAA 37* 04/01/2015 0835   GFRAA 43* 04/01/2015 0835   PATHOLOGY:      RADIOLOGY: I have personally reviewed the radiological images as listed and agreed with the findings in the report.  CLINICAL DATA: Right retroperitoneal mass  EXAM: CT-GUIDED BIOPSY OF A RETROPERITONEAL MASS. CORE.  MEDICATIONS AND MEDICAL HISTORY: Versed 1 mg, Fentanyl 100 mcg.  Additional Medications: None.  ANESTHESIA/SEDATION: Moderate sedation time: 11 minutes  PROCEDURE: The procedure, risks, benefits, and alternatives were explained to the patient. Questions regarding the procedure were encouraged and answered. The patient understands and consents to the procedure.  The back was prepped with ChloraPrep in a sterile fashion, and a sterile drape was applied covering the operative field. A sterile gown and sterile gloves were used for the procedure.  Under CT guidance, a(n) 17 gauge guide needle was advanced into the right retroperitoneal lesion. Subsequently four 18 gauge core biopsies were obtained and placed in saline. The guide needle was removed. Final imaging was performed.  Patient tolerated the procedure well without complication. Vital sign monitoring by nursing staff during the procedure will continue as patient is in the special procedures unit for post  procedure observation.  FINDINGS: The images document guide needle placement within the right retroperitoneal mass. Post biopsy images demonstrate no hemorrhage.  COMPLICATIONS: None  IMPRESSION: Successful CT-guided core biopsy of  a right retroperitoneal mass.   Electronically Signed  By: Marybelle Killings M.D.  On: 05/19/2015 10:50   ASSESSMENT & PLAN:  Stage IIX Non-Hodgkin's lymphoma, low-grade B cell lymphoma, CD20 and CD10 positive, on a core biopsy of a retroperitoneal mass 12/05/2014 Hypercalcemia of malignancy-resolved, calcium of >15 mg/dl on 12/03/2014 ARF secondary to above Vitamin D 103 pg/ml on 12/04/2014 Pamidronate on 12/20/2014 Hyperglycemia secondary to prednisone/ known history diabetes Klebsiella Bacteremia CT imaging on 05/06/2015 with progressive disease BIOPSY on 05/19/2015 with DLBCL DLBCL Stage II EF 60-65%, no regional wall motion abnormalities, grade 1 diastolic dysfunction  We spent time today in discussion of DLBCL, specifically transformation from follicular or low grade NHL. We discussed treatment options. We discussed vincristine use and his peripheral neuropathy.  I reviewed multiple options for therapy but advised them that current thought is that RCHOP is the most proven curative therapy with dosing of adriamycin in the first 12 weeks crucial.   I discussed my concerns of therapy but also explained the nature of DLBCL. The patient desire to try curative therapy.   The decision was made based on publication at the [facility]. It is a category 1 recommendation from NCCN.  CHOP Chemotherapy plus Rituximab Compared with CHOP Alone in Elderly Patients with Diffuse Large-B-Cell Lymphoma Jannet Askew, M.D., Rexene Edison, M.D., Ph.D., Farley Ly, M.D., Crawford Givens, M.D., Knox Royalty, M.D., Ricky Ala, M.D., Doree Fudge, M.D., Eppie Gibson Richarda Blade, M.D., Simona Huh, M.D., Ph.D., Jolene Schimke, M.D., Bishop Limbo, M.D., Evelene Croon,  Evelene Croon, Ph.D., and Sallyanne Kuster, M.D. Alison Stalling J Med 2002; 346:235-242January 24, 2002DOI: [id number]  The rate of complete response was significantly higher in the group that received CHOP plus rituximab than in the group that received CHOP alone (76 percent vs. 63 percent, P=0.005). With a median follow-up of two years, event-free and overall survival times were significantly higher in the CHOP-plus-rituximab group (P<0.001 and P=0.007, respectively). The addition of rituximab to standard CHOP chemotherapy significantly reduced the risk of treatment failure and death (risk ratios, 0.58 [95 percent confidence interval, 0.44 to 0.77] and 0.64 [0.45 to 0.89], respectively). Clinically relevant toxicity was not significantly greater with CHOP plus rituximab.  We discussed some of the risks, benefits and side-effects of Rituximab,Cytoxan, Adriamycin,Vincristine and Solumedrol/Prednisone.   Some of the short term side-effects included, though not limited to, risk of fatigue, weight loss, tumor lysis syndrome, risk of allergic reactions, pancytopenia, life-threatening infections, need for transfusions of blood products, nausea, vomiting, change in bowel habits, hair loss, risk of congestive heart failure, admission to hospital for various reasons, and risks of death.   Long term side-effects are also discussed including permanent damage to nerve function, chronic fatigue, and rare secondary malignancy including bone marrow disorders.   The patient is aware that the response rates discussed earlier is not guaranteed.    After a long discussion, patient made an informed decision to proceed with the prescribed plan of care and went ahead to sign the consent form today.   His neuropathy will be closely monitored, noting that it may have to be discontinued.  BMBX and port placement on Thursday. Cycle #1 will be given on Friday.  All questions were answered. The patient knows to  call the clinic with any problems, questions or concerns.   This note was electronically signed.   Kelby Fam. Whitney Muse, MD

## 2015-05-26 NOTE — Patient Instructions (Addendum)
Billy Gray   CHEMOTHERAPY INSTRUCTIONS  Premeds: Emend - for nausea/vomiting prevention, Zofran - for nausea/vomiting prevention, Dexamethasone - steroid - being given to reduce nausea/vomiting but can also decrease the risk of you having an allergic type reaction to the chemo, Elitek - this is a kidney protectant, as the tumor cells begin to break down they leave behind uric acid, phosphorus, potassium in the blood stream and can block the kidneys. The Elitek protects the kidneys and helps get the byproduct of the tumor cells out of the body more safely.  Rituxan - Before we administer Rituxan we will give you Tylenol 650mg  and Benadryl 50mg . This reduces your risk of having an allergic reaction to the Rituxan. You will receive these medications each time prior to Rituxan. Side Effects of Rituxan: during infusion - itching, low blood pressure, low oxygen, bronchospasm, rash, trouble breathing - we need to know immediately if any of this happens. The first time you receive this drug, it takes a long time to infuse because we titrate the drug very slowly. With each Rituxan infusion, the likelihood of developing an infusion reaction decreases. You may also experience fever, chills, shaking chills, headaches, muscle aches, nausea, rash, and a low white blood cell count. We need to be sure that you are drinking plenty of fluids - preferably 64oz of decaff fluids/water daily. It is best to start drinking fluids 2 days prior to treatment and for up to 4-5 days after treatment. As your tumor breaks down, it leaves behind uric acid and the extra fluid that you drink helps to flush this out of your body. You will also be on a medication called Allopurinol while taking Rituxan which help rid your body of the uric acid. It is important that you take this medication daily as prescribed.   Cytoxan - can cause hemorrhagic cystitis (bloody urine) - this chemo irritates your bladder! We  need you drinking 64 oz of fluid (preferably water/decaff fluids) 2 days prior to chemo and for up to 4-5 days after chemo. Drink more if you can. Do not hold your urine. Urinate before you go to bed and if you wake up in the middle of the night. This can also cause nausea/vomiting and hair loss. (takes 30 minutes to infuse)  Adriamycin - bone marrow suppression, nausea, vomiting, hair loss, mouth sores, cardiotoxicity- weakening of the pumping muscle of the heart (this is why we do the 2D Echoes/MUGA scans), sensitivity to light, will turn urine red for a few voids after receiving it. After voiding red a few times, your urine should begin to go back to a normal yellow color. (takes approximately 15 minutes for nurse to push through your IV)  Vincristine - peripheral neuropathy - numbness/tingling/burning in hands/fingers/feet/toes. Let us know if this develops. Hair loss, constipation, jaw pain  Prednisone 20mg  tablet. On Days 1-5 of chemo take 3 tablets (60mg  total) with food.    POTENTIAL SIDE EFFECTS OF TREATMENT: Increased Susceptibility to Infection, Vomiting, Constipation, Hair Thinning, Changes in Character of Skin and Nails (brittleness, dryness,etc.), Pigment Changes (darkening of veins, nail beds, palms of hands, soles of feet, etc.), Bone Marrow Suppression, Complete Hair Loss, Nausea, Diarrhea, Sun Sensitivity and Mouth Sores    EDUCATIONAL MATERIALS GIVEN AND REVIEWED: Chemotherapy and You booklet Specific Instructions Sheets: Rituxan, Cytoxan, Adriamycin, Vincristine, Prednisone, Elitek, Neulasta, Emend, Zofran, Dexamethasone, EMLA cream, Zofran, Compazine   SELF CARE ACTIVITIES WHILE ON CHEMOTHERAPY: Increase your fluid intake 48 hours prior  to treatment and drink at least 2 quarts per day after treatment., No alcohol intake., No aspirin or other medications unless approved by your oncologist., Eat foods that are light and easy to digest., Eat foods at cold or room temperature.,  No fried, fatty, or spicy foods immediately before or after treatment., Have teeth cleaned professionally before starting treatment. Keep dentures and partial plates clean., Use soft toothbrush and do not use mouthwashes that contain alcohol. Biotene is a good mouthwash that is available at most pharmacies or may be ordered by calling [phone]., Use warm salt water gargles (1 teaspoon salt per 1 quart warm water) before and after meals and at bedtime. Or you may rinse with 2 tablespoons of three -percent hydrogen peroxide mixed in eight ounces of water., Always use sunscreen with SPF (Sun Protection Factor) of 30 or higher., Use your nausea medication as directed to prevent nausea., Use your stool softener or laxative as directed to prevent constipation. and Use your anti-diarrheal medication as directed to stop diarrhea.  Please wash your hands for at least 30 seconds using warm soapy water. Handwashing is the #1 way to prevent the spread of germs. Stay away from sick people or people who are getting over a cold. If you develop respiratory systems such as green/yellow mucus production or productive cough or persistent cough let us know and we will see if you need an antibiotic. It is a good idea to keep a pair of gloves on when going into grocery stores/[employer] to decrease your risk of coming into contact with germs on the carts, etc. Carry alcohol hand gel with you at all times and use it frequently if out in public. All foods need to be cooked thoroughly. No raw foods. No medium or undercooked meats, eggs. If your food is cooked medium well, it does not need to be hot pink or saturated with bloody liquid at all. Vegetables and fruits need to be washed/rinsed under the faucet with a dish detergent before being consumed. You can eat raw fruits and vegetables unless we tell you otherwise but it would be best if you cooked them or bought frozen. Do not eat off of salad bars or hot bars unless you really trust  the cleanliness of the restaurant. If you need dental work, please let Dr. Whitney Muse know before you go for your appointment so that we can coordinate the best possible time for you in regards to your chemo regimen. You need to also let your dentist know that you are actively taking chemo. We may need to do labs prior to your dental appointment. We also want your bowels moving at least every other day. If this is not happening, we need to know so that we can get you on a bowel regimen to help you go. If you are going to have sex, you must wear a condom - this is to protect your partner from potential chemotherapy exposure. You must wear a condom when having sex for 28 days after chemo completion.   MEDICATIONS: You have been given prescriptions for the following medications:  Prednisone 20mg  tablet. On Days 1-5 of chemo, take 3 tablets (60mg  total) with food.   Humalog insulin. Check blood sugars three times a day and administer Humalog insulin as ordered:  151-200 ........ 2 units 201-250......... 4 units 251-300 ........ 6 units Greater than or equal to 301 ....Marland KitchenMarland KitchenMarland Kitchen 8 units and call doctor   Zofran 8mg  tablet. Take 1 tablet every 8 hours as needed  for nausea/vomiting. (#1 nausea med to take, this can constipate)  Compazine 10mg  tablet. Take 1 tablet every 6 hours as needed for nausea/vomiting. (#2 nausea med to take, this can make you sleepy)  EMLA cream. Apply a quarter size amount to port site 1 hour prior to chemo. Do not rub in. Cover with plastic wrap.   Over-the-Counter Meds:  Miralax 1 capful in 8 oz of fluid daily. May increase to two times a day if needed. This is a stool softener. If this doesn't work proceed you can add:  Senokot S  - start with 1 tablet two times a day and increase to 4 tablets two times a day if needed. (total of 8 tablets in a 24 hour period). This is a stimulant laxative.   Call us if this does not help your bowels move.   Imodium 2mg  capsule. Take 2  capsules after the 1st loose stool and then 1 capsule every 2 hours until you go a total of 12 hours without having a loose stool. Call the [facility] if loose stools continue. If diarrhea occurs @ bedtime, take 2 capsules @ bedtime. Then take 2 capsules every 4 hours until morning. Call [facility].    SYMPTOMS TO REPORT AS SOON AS POSSIBLE AFTER TREATMENT:  FEVER GREATER THAN 100.5 F  CHILLS WITH OR WITHOUT FEVER  NAUSEA AND VOMITING THAT IS NOT CONTROLLED WITH YOUR NAUSEA MEDICATION  UNUSUAL SHORTNESS OF BREATH  UNUSUAL BRUISING OR BLEEDING  TENDERNESS IN MOUTH AND THROAT WITH OR WITHOUT PRESENCE OF ULCERS  URINARY PROBLEMS  BOWEL PROBLEMS  UNUSUAL RASH    Wear comfortable clothing and clothing appropriate for easy access to any Portacath or PICC line. Let us know if there is anything that we can do to make your therapy better!      I have been informed and understand all of the instructions given to me and have received a copy. I have been instructed to call the clinic [phone] or my family physician as soon as possible for continued medical care, if indicated. I do not have any more questions at this time but understand that I may call the [facility] or the Patient Navigator at [phone] during office hours should I have questions or need assistance in obtaining follow-up care.           Rituximab injection What is this medicine? RITUXIMAB (ri TUX i mab) is a monoclonal antibody. It is used commonly to treat non-Hodgkin lymphoma and other conditions. It is also used to treat rheumatoid arthritis (RA). In RA, this medicine slows the inflammatory process and help reduce joint pain and swelling. This medicine is often used with other cancer or arthritis medications. This medicine may be used for other purposes; ask your health care provider or pharmacist if you have questions. What should I tell my health care provider before I take this  medicine? They need to know if you have any of these conditions: -blood disorders -heart disease -history of hepatitis B -infection (especially a virus infection such as chickenpox, cold sores, or herpes) -irregular heartbeat -kidney disease -lung or breathing disease, like asthma -lupus -an unusual or allergic reaction to rituximab, mouse proteins, other medicines, foods, dyes, or preservatives -pregnant or trying to get pregnant -breast-feeding How should I use this medicine? This medicine is for infusion into a vein. It is administered in a hospital or clinic by a specially trained health care professional. A special MedGuide will be given to  you by the pharmacist with each prescription and refill. Be sure to read this information carefully each time. Talk to your pediatrician regarding the use of this medicine in children. This medicine is not approved for use in children. Overdosage: If you think you have taken too much of this medicine contact a poison control center or emergency room at once. NOTE: This medicine is only for you. Do not share this medicine with others. What if I miss a dose? It is important not to miss a dose. Call your doctor or health care professional if you are unable to keep an appointment. What may interact with this medicine? -cisplatin -medicines for blood pressure -some other medicines for arthritis -vaccines This list may not describe all possible interactions. Give your health care provider a list of all the medicines, herbs, non-prescription drugs, or dietary supplements you use. Also tell them if you smoke, drink alcohol, or use illegal drugs. Some items may interact with your medicine. What should I watch for while using this medicine? Report any side effects that you notice during your treatment right away, such as changes in your breathing, fever, chills, dizziness or lightheadedness. These effects are more common with the first dose. Visit your  prescriber or health care professional for checks on your progress. You will need to have regular blood work. Report any other side effects. The side effects of this medicine can continue after you finish your treatment. Continue your course of treatment even though you feel ill unless your doctor tells you to stop. Call your doctor or health care professional for advice if you get a fever, chills or sore throat, or other symptoms of a cold or flu. Do not treat yourself. This drug decreases your body's ability to fight infections. Try to avoid being around people who are sick. This medicine may increase your risk to bruise or bleed. Call your doctor or health care professional if you notice any unusual bleeding. Be careful brushing and flossing your teeth or using a toothpick because you may get an infection or bleed more easily. If you have any dental work done, tell your dentist you are receiving this medicine. Avoid taking products that contain aspirin, acetaminophen, ibuprofen, naproxen, or ketoprofen unless instructed by your doctor. These medicines may hide a fever. Do not become pregnant while taking this medicine. Women should inform their doctor if they wish to become pregnant or think they might be pregnant. There is a potential for serious side effects to an unborn child. Talk to your health care professional or pharmacist for more information. Do not breast-feed an infant while taking this medicine. What side effects may I notice from receiving this medicine? Side effects that you should report to your doctor or health care professional as soon as possible: -allergic reactions like skin rash, itching or hives, swelling of the face, lips, or tongue -low blood counts - this medicine may decrease the number of white blood cells, red blood cells and platelets. You may be at increased risk for infections and bleeding. -signs of infection - fever or chills, cough, sore throat, pain or difficulty  passing urine -signs of decreased platelets or bleeding - bruising, pinpoint red spots on the skin, black, tarry stools, blood in the urine -signs of decreased red blood cells - unusually weak or tired, fainting spells, lightheadedness -breathing problems -confused, not responsive -chest pain -fast, irregular heartbeat -feeling faint or lightheaded, falls -mouth sores -redness, blistering, peeling or loosening of the skin, including inside the  mouth -stomach pain -swelling of the ankles, feet, or hands -trouble passing urine or change in the amount of urine Side effects that usually do not require medical attention (report to your doctor or other health care professional if they continue or are bothersome): -anxiety -headache -loss of appetite -muscle aches -nausea -night sweats This list may not describe all possible side effects. Call your doctor for medical advice about side effects. You may report side effects to FDA at 1-800-FDA-1088. Where should I keep my medicine? This drug is given in a hospital or clinic and will not be stored at home. NOTE: This sheet is a summary. It may not cover all possible information. If you have questions about this medicine, talk to your doctor, pharmacist, or health care provider.    2016, Elsevier/Gold Standard. (2014-08-14 22:30:56) Doxorubicin injection What is this medicine? DOXORUBICIN (dox oh ROO bi sin) is a chemotherapy drug. It is used to treat many kinds of cancer like Hodgkin's disease, leukemia, non-Hodgkin's lymphoma, neuroblastoma, sarcoma, and Wilms' tumor. It is also used to treat bladder cancer, breast cancer, lung cancer, ovarian cancer, stomach cancer, and thyroid cancer. This medicine may be used for other purposes; ask your health care provider or pharmacist if you have questions. What should I tell my health care provider before I take this medicine? They need to know if you have any of these conditions: -blood  disorders -heart disease, recent heart attack -infection (especially a virus infection such as chickenpox, cold sores, or herpes) -irregular heartbeat -liver disease -recent or ongoing radiation therapy -an unusual or allergic reaction to doxorubicin, other chemotherapy agents, other medicines, foods, dyes, or preservatives -pregnant or trying to get pregnant -breast-feeding How should I use this medicine? This drug is given as an infusion into a vein. It is administered in a hospital or clinic by a specially trained health care professional. If you have pain, swelling, burning or any unusual feeling around the site of your injection, tell your health care professional right away. Talk to your pediatrician regarding the use of this medicine in children. Special care may be needed. Overdosage: If you think you have taken too much of this medicine contact a poison control center or emergency room at once. NOTE: This medicine is only for you. Do not share this medicine with others. What if I miss a dose? It is important not to miss your dose. Call your doctor or health care professional if you are unable to keep an appointment. What may interact with this medicine? Do not take this medicine with any of the following medications: -cisapride -droperidol -halofantrine -pimozide -zidovudine This medicine may also interact with the following medications: -chloroquine -chlorpromazine -clarithromycin -cyclophosphamide -cyclosporine -erythromycin -medicines for depression, anxiety, or psychotic disturbances -medicines for irregular heart beat like amiodarone, bepridil, dofetilide, encainide, flecainide, propafenone, quinidine -medicines for seizures like ethotoin, fosphenytoin, phenytoin -medicines for nausea, vomiting like dolasetron, ondansetron, palonosetron -medicines to increase blood counts like filgrastim, pegfilgrastim,  sargramostim -methadone -methotrexate -pentamidine -progesterone -vaccines -verapamil Talk to your doctor or health care professional before taking any of these medicines: -acetaminophen -aspirin -ibuprofen -ketoprofen -naproxen This list may not describe all possible interactions. Give your health care provider a list of all the medicines, herbs, non-prescription drugs, or dietary supplements you use. Also tell them if you smoke, drink alcohol, or use illegal drugs. Some items may interact with your medicine. What should I watch for while using this medicine? Your condition will be monitored carefully while you are receiving this medicine.  You will need important blood work done while you are taking this medicine. This drug may make you feel generally unwell. This is not uncommon, as chemotherapy can affect healthy cells as well as cancer cells. Report any side effects. Continue your course of treatment even though you feel ill unless your doctor tells you to stop. Your urine may turn red for a few days after your dose. This is not blood. If your urine is dark or brown, call your doctor. In some cases, you may be given additional medicines to help with side effects. Follow all directions for their use. Call your doctor or health care professional for advice if you get a fever, chills or sore throat, or other symptoms of a cold or flu. Do not treat yourself. This drug decreases your body's ability to fight infections. Try to avoid being around people who are sick. This medicine may increase your risk to bruise or bleed. Call your doctor or health care professional if you notice any unusual bleeding. Be careful brushing and flossing your teeth or using a toothpick because you may get an infection or bleed more easily. If you have any dental work done, tell your dentist you are receiving this medicine. Avoid taking products that contain aspirin, acetaminophen, ibuprofen, naproxen, or ketoprofen  unless instructed by your doctor. These medicines may hide a fever. Men and women of childbearing age should use effective birth control methods while using taking this medicine. Do not become pregnant while taking this medicine. There is a potential for serious side effects to an unborn child. Talk to your health care professional or pharmacist for more information. Do not breast-feed an infant while taking this medicine. Do not let others touch your urine or other body fluids for 5 days after each treatment with this medicine. Caregivers should wear latex gloves to avoid touching body fluids during this time. There is a maximum amount of this medicine you should receive throughout your life. The amount depends on the medical condition being treated and your overall health. Your doctor will watch how much of this medicine you receive in your lifetime. Tell your doctor if you have taken this medicine before. What side effects may I notice from receiving this medicine? Side effects that you should report to your doctor or health care professional as soon as possible: -allergic reactions like skin rash, itching or hives, swelling of the face, lips, or tongue -low blood counts - this medicine may decrease the number of white blood cells, red blood cells and platelets. You may be at increased risk for infections and bleeding. -signs of infection - fever or chills, cough, sore throat, pain or difficulty passing urine -signs of decreased platelets or bleeding - bruising, pinpoint red spots on the skin, black, tarry stools, blood in the urine -signs of decreased red blood cells - unusually weak or tired, fainting spells, lightheadedness -breathing problems -chest pain -fast, irregular heartbeat -mouth sores -nausea, vomiting -pain, swelling, redness at site where injected -pain, tingling, numbness in the hands or feet -swelling of ankles, feet, or hands -unusual bleeding or bruising Side effects that  usually do not require medical attention (report to your doctor or health care professional if they continue or are bothersome): -diarrhea -facial flushing -hair loss -loss of appetite -missed menstrual periods -nail discoloration or damage -red or watery eyes -red colored urine -stomach upset This list may not describe all possible side effects. Call your doctor for medical advice about side effects. You may report  side effects to FDA at 1-800-FDA-1088. Where should I keep my medicine? This drug is given in a hospital or clinic and will not be stored at home. NOTE: This sheet is a summary. It may not cover all possible information. If you have questions about this medicine, talk to your doctor, pharmacist, or health care provider.    2016, Elsevier/Gold Standard. (2012-10-03 09:54:34) Cyclophosphamide injection What is this medicine? CYCLOPHOSPHAMIDE (sye kloe FOSS fa mide) is a chemotherapy drug. It slows the growth of cancer cells. This medicine is used to treat many types of cancer like lymphoma, myeloma, leukemia, breast cancer, and ovarian cancer, to name a few. This medicine may be used for other purposes; ask your health care provider or pharmacist if you have questions. What should I tell my health care provider before I take this medicine? They need to know if you have any of these conditions: -blood disorders -history of other chemotherapy -infection -kidney disease -liver disease -recent or ongoing radiation therapy -tumors in the bone marrow -an unusual or allergic reaction to cyclophosphamide, other chemotherapy, other medicines, foods, dyes, or preservatives -pregnant or trying to get pregnant -breast-feeding How should I use this medicine? This drug is usually given as an injection into a vein or muscle or by infusion into a vein. It is administered in a hospital or clinic by a specially trained health care professional. Talk to your pediatrician regarding the use  of this medicine in children. Special care may be needed. Overdosage: If you think you have taken too much of this medicine contact a poison control center or emergency room at once. NOTE: This medicine is only for you. Do not share this medicine with others. What if I miss a dose? It is important not to miss your dose. Call your doctor or health care professional if you are unable to keep an appointment. What may interact with this medicine? This medicine may interact with the following medications: -amiodarone -amphotericin B -azathioprine -certain antiviral medicines for HIV or AIDS such as protease inhibitors (e.g., indinavir, ritonavir) and zidovudine -certain blood pressure medications such as benazepril, captopril, enalapril, fosinopril, lisinopril, moexipril, monopril, perindopril, quinapril, ramipril, trandolapril -certain cancer medications such as anthracyclines (e.g., daunorubicin, doxorubicin), busulfan, cytarabine, paclitaxel, pentostatin, tamoxifen, trastuzumab -certain diuretics such as chlorothiazide, chlorthalidone, hydrochlorothiazide, indapamide, metolazone -certain medicines that treat or prevent blood clots like warfarin -certain muscle relaxants such as succinylcholine -cyclosporine -etanercept -indomethacin -medicines to increase blood counts like filgrastim, pegfilgrastim, sargramostim -medicines used as general anesthesia -metronidazole -natalizumab This list may not describe all possible interactions. Give your health care provider a list of all the medicines, herbs, non-prescription drugs, or dietary supplements you use. Also tell them if you smoke, drink alcohol, or use illegal drugs. Some items may interact with your medicine. What should I watch for while using this medicine? Visit your doctor for checks on your progress. This drug may make you feel generally unwell. This is not uncommon, as chemotherapy can affect healthy cells as well as cancer cells. Report  any side effects. Continue your course of treatment even though you feel ill unless your doctor tells you to stop. Drink water or other fluids as directed. Urinate often, even at night. In some cases, you may be given additional medicines to help with side effects. Follow all directions for their use. Call your doctor or health care professional for advice if you get a fever, chills or sore throat, or other symptoms of a cold or flu. Do not treat yourself.  This drug decreases your body's ability to fight infections. Try to avoid being around people who are sick. This medicine may increase your risk to bruise or bleed. Call your doctor or health care professional if you notice any unusual bleeding. Be careful brushing and flossing your teeth or using a toothpick because you may get an infection or bleed more easily. If you have any dental work done, tell your dentist you are receiving this medicine. You may get drowsy or dizzy. Do not drive, use machinery, or do anything that needs mental alertness until you know how this medicine affects you. Do not become pregnant while taking this medicine or for 1 year after stopping it. Women should inform their doctor if they wish to become pregnant or think they might be pregnant. Men should not father a child while taking this medicine and for 4 months after stopping it. There is a potential for serious side effects to an unborn child. Talk to your health care professional or pharmacist for more information. Do not breast-feed an infant while taking this medicine. This medicine may interfere with the ability to have a child. This medicine has caused ovarian failure in some women. This medicine has caused reduced sperm counts in some men. You should talk with your doctor or health care professional if you are concerned about your fertility. If you are going to have surgery, tell your doctor or health care professional that you have taken this medicine. What side  effects may I notice from receiving this medicine? Side effects that you should report to your doctor or health care professional as soon as possible: -allergic reactions like skin rash, itching or hives, swelling of the face, lips, or tongue -low blood counts - this medicine may decrease the number of white blood cells, red blood cells and platelets. You may be at increased risk for infections and bleeding. -signs of infection - fever or chills, cough, sore throat, pain or difficulty passing urine -signs of decreased platelets or bleeding - bruising, pinpoint red spots on the skin, black, tarry stools, blood in the urine -signs of decreased red blood cells - unusually weak or tired, fainting spells, lightheadedness -breathing problems -dark urine -dizziness -palpitations -swelling of the ankles, feet, hands -trouble passing urine or change in the amount of urine -weight gain -yellowing of the eyes or skin Side effects that usually do not require medical attention (report to your doctor or health care professional if they continue or are bothersome): -changes in nail or skin color -hair loss -missed menstrual periods -mouth sores -nausea, vomiting This list may not describe all possible side effects. Call your doctor for medical advice about side effects. You may report side effects to FDA at 1-800-FDA-1088. Where should I keep my medicine? This drug is given in a hospital or clinic and will not be stored at home. NOTE: This sheet is a summary. It may not cover all possible information. If you have questions about this medicine, talk to your doctor, pharmacist, or health care provider.    2016, [employer]. (2012-04-21 16:22:58) Vincristine injection What is this medicine? VINCRISTINE (vin KRIS teen) is a chemotherapy drug. It slows the growth of cancer cells. This medicine is used to treat many types of cancer like Hodgkin's disease, leukemia, non-Hodgkin's lymphoma,  neuroblastoma (brain cancer), rhabdomyosarcoma, and Wilms' tumor. This medicine may be used for other purposes; ask your health care provider or pharmacist if you have questions. What should I tell my health care provider before  I take this medicine? They need to know if you have any of these conditions: -blood disorders -gout -infection (especially chickenpox, cold sores, or herpes) -kidney disease -liver disease -lung disease -nervous system disease like Charcot-Marie-Tooth (CMT) -recent or ongoing radiation therapy -an unusual or allergic reaction to vincristine, other chemotherapy agents, other medicines, foods, dyes, or preservatives -pregnant or trying to get pregnant -breast-feeding How should I use this medicine? This drug is given as an infusion into a vein. It is administered in a hospital or clinic by a specially trained health care professional. If you have pain, swelling, burning, or any unusual feeling around the site of your injection, tell your health care professional right away. Talk to your pediatrician regarding the use of this medicine in children. While this drug may be prescribed for selected conditions, precautions do apply. Overdosage: If you think you have taken too much of this medicine contact a poison control center or emergency room at once. NOTE: This medicine is only for you. Do not share this medicine with others. What if I miss a dose? It is important not to miss your dose. Call your doctor or health care professional if you are unable to keep an appointment. What may interact with this medicine? Do not take this medicine with any of the following medications: -itraconazole -mibefradil -voriconazole This medicine may also interact with the following medications: -cyclosporine -erythromycin -fluconazole -ketoconazole -medicines for HIV like delavirdine, efavirenz, nevirapine -medicines for seizures like ethotoin, fosphenotoin, phenytoin -medicines to  increase blood counts like filgrastim, pegfilgrastim, sargramostim -other chemotherapy drugs like cisplatin, L-asparaginase, methotrexate, mitomycin, paclitaxel -pegaspargase -vaccines -zalcitabine, ddC Talk to your doctor or health care professional before taking any of these medicines: -acetaminophen -aspirin -ibuprofen -ketoprofen -naproxen This list may not describe all possible interactions. Give your health care provider a list of all the medicines, herbs, non-prescription drugs, or dietary supplements you use. Also tell them if you smoke, drink alcohol, or use illegal drugs. Some items may interact with your medicine. What should I watch for while using this medicine? Your condition will be monitored carefully while you are receiving this medicine. You will need important blood work done while you are taking this medicine. This drug may make you feel generally unwell. This is not uncommon, as chemotherapy can affect healthy cells as well as cancer cells. Report any side effects. Continue your course of treatment even though you feel ill unless your doctor tells you to stop. In some cases, you may be given additional medicines to help with side effects. Follow all directions for their use. Call your doctor or health care professional for advice if you get a fever, chills or sore throat, or other symptoms of a cold or flu. Do not treat yourself. Avoid taking products that contain aspirin, acetaminophen, ibuprofen, naproxen, or ketoprofen unless instructed by your doctor. These medicines may hide a fever. Do not become pregnant while taking this medicine. Women should inform their doctor if they wish to become pregnant or think they might be pregnant. There is a potential for serious side effects to an unborn child. Talk to your health care professional or pharmacist for more information. Do not breast-feed an infant while taking this medicine. Men may have a lower sperm count while taking  this medicine. Talk to your doctor if you plan to father a child. What side effects may I notice from receiving this medicine? Side effects that you should report to your doctor or health care professional as soon as possible: -  allergic reactions like skin rash, itching or hives, swelling of the face, lips, or tongue -breathing problems -confusion or changes in emotions or moods -constipation -cough -mouth sores -muscle weakness -nausea and vomiting -pain, swelling, redness or irritation at the injection site -pain, tingling, numbness in the hands or feet -problems with balance, talking, walking -seizures -stomach pain -trouble passing urine or change in the amount of urine Side effects that usually do not require medical attention (report to your doctor or health care professional if they continue or are bothersome): -diarrhea -hair loss -jaw pain -loss of appetite This list may not describe all possible side effects. Call your doctor for medical advice about side effects. You may report side effects to FDA at 1-800-FDA-1088. Where should I keep my medicine? This drug is given in a hospital or clinic and will not be stored at home. NOTE: This sheet is a summary. It may not cover all possible information. If you have questions about this medicine, talk to your doctor, pharmacist, or health care provider.    2016, [employer]. (2008-03-04 17:17:13) Prednisone tablets What is this medicine? PREDNISONE (PRED ni sone) is a corticosteroid. It is commonly used to treat inflammation of the skin, joints, lungs, and other organs. Common conditions treated include asthma, allergies, and arthritis. It is also used for other conditions, such as blood disorders and diseases of the adrenal glands. This medicine may be used for other purposes; ask your health care provider or pharmacist if you have questions. What should I tell my health care provider before I take this medicine? They  need to know if you have any of these conditions: -Cushing's syndrome -diabetes -glaucoma -heart disease -high blood pressure -infection (especially a virus infection such as chickenpox, cold sores, or herpes) -kidney disease -liver disease -mental illness -myasthenia gravis -osteoporosis -seizures -stomach or intestine problems -thyroid disease -an unusual or allergic reaction to lactose, prednisone, other medicines, foods, dyes, or preservatives -pregnant or trying to get pregnant -breast-feeding How should I use this medicine? Take this medicine by mouth with a glass of water. Follow the directions on the prescription label. Take this medicine with food. If you are taking this medicine once a day, take it in the morning. Do not take more medicine than you are told to take. Do not suddenly stop taking your medicine because you may develop a severe reaction. Your doctor will tell you how much medicine to take. If your doctor wants you to stop the medicine, the dose may be slowly lowered over time to avoid any side effects. Talk to your pediatrician regarding the use of this medicine in children. Special care may be needed. Overdosage: If you think you have taken too much of this medicine contact a poison control center or emergency room at once. NOTE: This medicine is only for you. Do not share this medicine with others. What if I miss a dose? If you miss a dose, take it as soon as you can. If it is almost time for your next dose, talk to your doctor or health care professional. You may need to miss a dose or take an extra dose. Do not take double or extra doses without advice. What may interact with this medicine? Do not take this medicine with any of the following medications: -metyrapone -mifepristone This medicine may also interact with the following medications: -aminoglutethimide -amphotericin B -aspirin and aspirin-like medicines -barbiturates -certain medicines for  diabetes, like glipizide or glyburide -cholestyramine -cholinesterase inhibitors -cyclosporine -digoxin -diuretics -ephedrine -  male hormones, like estrogens and birth control pills -isoniazid -ketoconazole -NSAIDS, medicines for pain and inflammation, like ibuprofen or naproxen -phenytoin -rifampin -toxoids -vaccines -warfarin This list may not describe all possible interactions. Give your health care provider a list of all the medicines, herbs, non-prescription drugs, or dietary supplements you use. Also tell them if you smoke, drink alcohol, or use illegal drugs. Some items may interact with your medicine. What should I watch for while using this medicine? Visit your doctor or health care professional for regular checks on your progress. If you are taking this medicine over a prolonged period, carry an identification card with your name and address, the type and dose of your medicine, and your doctor's name and address. This medicine may increase your risk of getting an infection. Tell your doctor or health care professional if you are around anyone with measles or chickenpox, or if you develop sores or blisters that do not heal properly. If you are going to have surgery, tell your doctor or health care professional that you have taken this medicine within the last twelve months. Ask your doctor or health care professional about your diet. You may need to lower the amount of salt you eat. This medicine may affect blood sugar levels. If you have diabetes, check with your doctor or health care professional before you change your diet or the dose of your diabetic medicine. What side effects may I notice from receiving this medicine? Side effects that you should report to your doctor or health care professional as soon as possible: -allergic reactions like skin rash, itching or hives, swelling of the face, lips, or tongue -changes in emotions or moods -changes in vision -depressed  mood -eye pain -fever or chills, cough, sore throat, pain or difficulty passing urine -increased thirst -swelling of ankles, feet Side effects that usually do not require medical attention (report to your doctor or health care professional if they continue or are bothersome): -confusion, excitement, restlessness -headache -nausea, vomiting -skin problems, acne, thin and shiny skin -trouble sleeping -weight gain This list may not describe all possible side effects. Call your doctor for medical advice about side effects. You may report side effects to FDA at 1-800-FDA-1088. Where should I keep my medicine? Keep out of the reach of children. Store at room temperature between 15 and 30 degrees C (59 and 86 degrees F). Protect from light. Keep container tightly closed. Throw away any unused medicine after the expiration date. NOTE: This sheet is a summary. It may not cover all possible information. If you have questions about this medicine, talk to your doctor, pharmacist, or health care provider.    2016, [employer]. (2011-01-21 10:57:14) Rasburicase Injection What is this medicine? RASBURICASE (ras BURE i kase) breaks down uric acid in the blood. It is used to prevent and to treat high levels of uric acid caused by cancer treatment. This medicine may be used for other purposes; ask your health care provider or pharmacist if you have questions. What should I tell my health care provider before I take this medicine? They need to know if you have any of these conditions: -G6PD deficiency -history of anemia -history of blood transfusion -an unusual or allergic reaction to rasburicase, yeast, mannitol, other medicines, foods, dyes, or preservatives -pregnant or trying to get pregnant -breast-feeding How should I use this medicine? This medicine is for infusion into a vein. It is given by a health care professional in a hospital or clinic setting. Talk to your  pediatrician  regarding the use of this medicine in children. While this drug may be prescribed for children as young as 59 month old for selected conditions, precautions do apply. Overdosage: If you think you have taken too much of this medicine contact a poison control center or emergency room at once. NOTE: This medicine is only for you. Do not share this medicine with others. What if I miss a dose? This does not apply. What may interact with this medicine? Interactions have not been studied. Give your health care provider a list of all the medicines, herbs, non-prescription drugs, or dietary supplements you use. Also tell them if you smoke, drink alcohol, or use illegal drugs. Some items may interact with your medicine. This list may not describe all possible interactions. Give your health care provider a list of all the medicines, herbs, non-prescription drugs, or dietary supplements you use. Also tell them if you smoke, drink alcohol, or use illegal drugs. Some items may interact with your medicine. What should I watch for while using this medicine? Your condition will be monitored carefully while you are receiving this medicine. You will need to have regular blood tests during your treatment. What side effects may I notice from receiving this medicine? Side effects that you should report to your doctor or health care professional as soon as possible: -allergic reactions like skin rash, itching or hives, swelling of the face, lips, or tongue -blue color to lips or nailbeds -breathing problems -chest pain, tightness -confusion -fast, irregular heartbeat -feeling faint or lightheaded, falls -low blood pressure -seizures -unusually weak or tired -yellowing of the eyes or skin Side effects that usually do not require medical attention (report to your doctor or health care professional if they continue or are bothersome): -abdominal pain -constipation -diarrhea -fever -headache -nausea,  vomiting -swelling of the ankles, feet, hands This list may not describe all possible side effects. Call your doctor for medical advice about side effects. You may report side effects to FDA at 1-800-FDA-1088. Where should I keep my medicine? This drug is given in a hospital or clinic and will not be stored at home. NOTE: This sheet is a summary. It may not cover all possible information. If you have questions about this medicine, talk to your doctor, pharmacist, or health care provider.    2016, [employer]. (2013-11-30 13:24:23) Fosaprepitant injection What is this medicine? FOSAPREPITANT (fos ap RE pi tant) is used together with other medicines to prevent nausea and vomiting caused by cancer treatment (chemotherapy). This medicine may be used for other purposes; ask your health care provider or pharmacist if you have questions. What should I tell my health care provider before I take this medicine? They need to know if you have any of these conditions: -liver disease -an unusual or allergic reaction to fosaprepitant, aprepitant, medicines, foods, dyes, or preservatives -pregnant or trying to get pregnant -breast-feeding How should I use this medicine? This medicine is for injection into a vein. It is given by a health care professional in a hospital or clinic setting. Talk to your pediatrician regarding the use of this medicine in children. Special care may be needed. Overdosage: If you think you have taken too much of this medicine contact a poison control center or emergency room at once. NOTE: This medicine is only for you. Do not share this medicine with others. What if I miss a dose? This does not apply. What may interact with this medicine? Do not take this medicine with any  of these medicines: -cisapride -flibanserin -lomitapide -pimozide This medicine may also interact with the following medications: -diltiazem -male hormones, like estrogens or progestins and  birth control pills -medicines for fungal infections like ketoconazole and itraconazole -medicines for HIV -medicines for seizures or to control epilepsy like carbamazepine or phenytoin -medicines used for sleep or anxiety disorders like alprazolam, diazepam, or midazolam -nefazodone -paroxetine -ranolazine -rifampin -some chemotherapy medications like etoposide, ifosfamide, vinblastine, vincristine -some antibiotics like clarithromycin, erythromycin, troleandomycin -steroid medicines like dexamethasone or methylprednisolone -tolbutamide -warfarin This list may not describe all possible interactions. Give your health care provider a list of all the medicines, herbs, non-prescription drugs, or dietary supplements you use. Also tell them if you smoke, drink alcohol, or use illegal drugs. Some items may interact with your medicine. What should I watch for while using this medicine? Do not take this medicine if you already have nausea and vomiting. Ask your health care provider what to do if you already have nausea. Birth control pills and other methods of hormonal contraception (for example, IUD or patch) may not work properly while you are taking this medicine. Use an extra method of birth control during treatment and for 1 month after your last dose of fosaprepitant. This medicine should not be used continuously for a long time. Visit your doctor or health care professional for regular check-ups. This medicine may change your liver function blood test results. What side effects may I notice from receiving this medicine? Side effects that you should report to your doctor or health care professional as soon as possible: -allergic reactions like skin rash, itching or hives, swelling of the face, lips, or tongue -breathing problems -changes in heart rhythm -high or low blood pressure -pain, redness, or irritation at site where injected -rectal bleeding -serious dizziness or disorientation,  confusion -sharp or severe stomach pain -sharp pain in your leg Side effects that usually do not require medical attention (report to your doctor or health care professional if they continue or are bothersome): -constipation or diarrhea -hair loss -headache -hiccups -loss of appetite -nausea -upset stomach -tiredness This list may not describe all possible side effects. Call your doctor for medical advice about side effects. You may report side effects to FDA at 1-800-FDA-1088. Where should I keep my medicine? This drug is given in a hospital or clinic and will not be stored at home. NOTE: This sheet is a summary. It may not cover all possible information. If you have questions about this medicine, talk to your doctor, pharmacist, or health care provider.    2016, [employer]. (2014-07-24 10:45:34) Ondansetron injection What is this medicine? ONDANSETRON (on DAN se tron) is used to treat nausea and vomiting caused by chemotherapy. It is also used to prevent or treat nausea and vomiting after surgery. This medicine may be used for other purposes; ask your health care provider or pharmacist if you have questions. What should I tell my health care provider before I take this medicine? They need to know if you have any of these conditions: -heart disease -history of irregular heartbeat -liver disease -low levels of magnesium or potassium in the blood -an unusual or allergic reaction to ondansetron, granisetron, other medicines, foods, dyes, or preservatives -pregnant or trying to get pregnant -breast-feeding How should I use this medicine? This medicine is for infusion into a vein. It is given by a health care professional in a hospital or clinic setting. Talk to your pediatrician regarding the use of this medicine in children. Special  care may be needed. Overdosage: If you think you have taken too much of this medicine contact a poison control center or emergency room at  once. NOTE: This medicine is only for you. Do not share this medicine with others. What if I miss a dose? This does not apply. What may interact with this medicine? Do not take this medicine with any of the following medications: -apomorphine -certain medicines for fungal infections like fluconazole, itraconazole, ketoconazole, posaconazole, voriconazole -cisapride -dofetilide -dronedarone -pimozide -thioridazine -ziprasidone This medicine may also interact with the following medications: -carbamazepine -certain medicines for depression, anxiety, or psychotic disturbances -fentanyl -linezolid -MAOIs like Carbex, Eldepryl, Marplan, Nardil, and Parnate -methylene blue (injected into a vein) -other medicines that prolong the QT interval (cause an abnormal heart rhythm) -phenytoin -rifampicin -tramadol This list may not describe all possible interactions. Give your health care provider a list of all the medicines, herbs, non-prescription drugs, or dietary supplements you use. Also tell them if you smoke, drink alcohol, or use illegal drugs. Some items may interact with your medicine. What should I watch for while using this medicine? Your condition will be monitored carefully while you are receiving this medicine. What side effects may I notice from receiving this medicine? Side effects that you should report to your doctor or health care professional as soon as possible: -allergic reactions like skin rash, itching or hives, swelling of the face, lips, or tongue -breathing problems -confusion -dizziness -fast or irregular heartbeat -feeling faint or lightheaded, falls -fever and chills -loss of balance or coordination -seizures -sweating -swelling of the hands and feet -tightness in the chest -tremors -unusually weak or tired Side effects that usually do not require medical attention (report to your doctor or health care professional if they continue or are  bothersome): -constipation or diarrhea -headache This list may not describe all possible side effects. Call your doctor for medical advice about side effects. You may report side effects to FDA at 1-800-FDA-1088. Where should I keep my medicine? This drug is given in a hospital or clinic and will not be stored at home. NOTE: This sheet is a summary. It may not cover all possible information. If you have questions about this medicine, talk to your doctor, pharmacist, or health care provider.    2016, [employer]. (2013-03-14 16:18:28) Dexamethasone injection What is this medicine? DEXAMETHASONE (dex a METH a sone) is a corticosteroid. It is used to treat inflammation of the skin, joints, lungs, and other organs. Common conditions treated include asthma, allergies, and arthritis. It is also used for other conditions, like blood disorders and diseases of the adrenal glands. This medicine may be used for other purposes; ask your health care provider or pharmacist if you have questions. What should I tell my health care provider before I take this medicine? They need to know if you have any of these conditions: -blood clotting problems -Cushing's syndrome -diabetes -glaucoma -heart problems or disease -high blood pressure -infection like herpes, measles, tuberculosis, or chickenpox -kidney disease -liver disease -mental problems -myasthenia gravis -osteoporosis -previous heart attack -seizures -stomach, ulcer or intestine disease including colitis and diverticulitis -thyroid problem -an unusual or allergic reaction to dexamethasone, corticosteroids, other medicines, lactose, foods, dyes, or preservatives -pregnant or trying to get pregnant -breast-feeding How should I use this medicine? This medicine is for injection into a muscle, joint, lesion, soft tissue, or vein. It is given by a health care professional in a hospital or clinic setting. Talk to your pediatrician  regarding the  use of this medicine in children. Special care may be needed. Overdosage: If you think you have taken too much of this medicine contact a poison control center or emergency room at once. NOTE: This medicine is only for you. Do not share this medicine with others. What if I miss a dose? This may not apply. If you are having a series of injections over a prolonged period, try not to miss an appointment. Call your doctor or health care professional to reschedule if you are unable to keep an appointment. What may interact with this medicine? Do not take this medicine with any of the following medications: -mifepristone, RU-486 -vaccines This medicine may also interact with the following medications: -amphotericin B -antibiotics like clarithromycin, erythromycin, and troleandomycin -aspirin and aspirin-like drugs -barbiturates like phenobarbital -carbamazepine -cholestyramine -cholinesterase inhibitors like donepezil, galantamine, rivastigmine, and tacrine -cyclosporine -digoxin -diuretics -ephedrine -male hormones, like estrogens or progestins and birth control pills -indinavir -isoniazid -ketoconazole -medicines for diabetes -medicines that improve muscle tone or strength for conditions like myasthenia gravis -NSAIDs, medicines for pain and inflammation, like ibuprofen or naproxen -phenytoin -rifampin -thalidomide -warfarin This list may not describe all possible interactions. Give your health care provider a list of all the medicines, herbs, non-prescription drugs, or dietary supplements you use. Also tell them if you smoke, drink alcohol, or use illegal drugs. Some items may interact with your medicine. What should I watch for while using this medicine? Your condition will be monitored carefully while you are receiving this medicine. If you are taking this medicine for a long time, carry an identification card with your name and address, the type and dose of your  medicine, and your doctor's name and address. This medicine may increase your risk of getting an infection. Stay away from people who are sick. Tell your doctor or health care professional if you are around anyone with measles or chickenpox. Talk to your health care provider before you get any vaccines that you take this medicine. If you are going to have surgery, tell your doctor or health care professional that you have taken this medicine within the last twelve months. Ask your doctor or health care professional about your diet. You may need to lower the amount of salt you eat. The medicine can increase your blood sugar. If you are a diabetic check with your doctor if you need help adjusting the dose of your diabetic medicine. What side effects may I notice from receiving this medicine? Side effects that you should report to your doctor or health care professional as soon as possible: -allergic reactions like skin rash, itching or hives, swelling of the face, lips, or tongue -black or tarry stools -change in the amount of urine -changes in vision -confusion, excitement, restlessness, a false sense of well-being -fever, sore throat, sneezing, cough, or other signs of infection, wounds that will not heal -hallucinations -increased thirst -mental depression, mood swings, mistaken feelings of self importance or of being mistreated -pain in hips, back, ribs, arms, shoulders, or legs -pain, redness, or irritation at the injection site -redness, blistering, peeling or loosening of the skin, including inside the mouth -rounding out of face -swelling of feet or lower legs -unusual bleeding or bruising -unusual tired or weak -wounds that do not heal Side effects that usually do not require medical attention (report to your doctor or health care professional if they continue or are bothersome): -diarrhea or constipation -change in taste -headache -nausea, vomiting -skin problems, acne, thin and  shiny skin -  touble sleeping -unusual growth of hair on the face or body -weight gain This list may not describe all possible side effects. Call your doctor for medical advice about side effects. You may report side effects to FDA at 1-800-FDA-1088. Where should I keep my medicine? This drug is given in a hospital or clinic and will not be stored at home. NOTE: This sheet is a summary. It may not cover all possible information. If you have questions about this medicine, talk to your doctor, pharmacist, or health care provider.    2016, [employer]. (2007-09-28 14:04:12) Pegfilgrastim injection What is this medicine? PEGFILGRASTIM (PEG fil gra stim) is a long-acting granulocyte colony-stimulating factor that stimulates the growth of neutrophils, a type of white blood cell important in the body's fight against infection. It is used to reduce the incidence of fever and infection in patients with certain types of cancer who are receiving chemotherapy that affects the bone marrow, and to increase survival after being exposed to high doses of radiation. This medicine may be used for other purposes; ask your health care provider or pharmacist if you have questions. What should I tell my health care provider before I take this medicine? They need to know if you have any of these conditions: -kidney disease -latex allergy -ongoing radiation therapy -sickle cell disease -skin reactions to acrylic adhesives (On-Body Injector only) -an unusual or allergic reaction to pegfilgrastim, filgrastim, other medicines, foods, dyes, or preservatives -pregnant or trying to get pregnant -breast-feeding How should I use this medicine? This medicine is for injection under the skin. If you get this medicine at home, you will be taught how to prepare and give the pre-filled syringe or how to use the On-body Injector. Refer to the patient Instructions for Use for detailed instructions. Use exactly as  directed. Take your medicine at regular intervals. Do not take your medicine more often than directed. It is important that you put your used needles and syringes in a special sharps container. Do not put them in a trash can. If you do not have a sharps container, call your pharmacist or healthcare provider to get one. Talk to your pediatrician regarding the use of this medicine in children. While this drug may be prescribed for selected conditions, precautions do apply. Overdosage: If you think you have taken too much of this medicine contact a poison control center or emergency room at once. NOTE: This medicine is only for you. Do not share this medicine with others. What if I miss a dose? It is important not to miss your dose. Call your doctor or health care professional if you miss your dose. If you miss a dose due to an On-body Injector failure or leakage, a new dose should be administered as soon as possible using a single prefilled syringe for manual use. What may interact with this medicine? Interactions have not been studied. Give your health care provider a list of all the medicines, herbs, non-prescription drugs, or dietary supplements you use. Also tell them if you smoke, drink alcohol, or use illegal drugs. Some items may interact with your medicine. This list may not describe all possible interactions. Give your health care provider a list of all the medicines, herbs, non-prescription drugs, or dietary supplements you use. Also tell them if you smoke, drink alcohol, or use illegal drugs. Some items may interact with your medicine. What should I watch for while using this medicine? You may need blood work done while you are taking this medicine.  If you are going to need a MRI, CT scan, or other procedure, tell your doctor that you are using this medicine (On-Body Injector only). What side effects may I notice from receiving this medicine? Side effects that you should report to your doctor  or health care professional as soon as possible: -allergic reactions like skin rash, itching or hives, swelling of the face, lips, or tongue -dizziness -fever -pain, redness, or irritation at site where injected -pinpoint red spots on the skin -red or dark-brown urine -shortness of breath or breathing problems -stomach or side pain, or pain at the shoulder -swelling -tiredness -trouble passing urine or change in the amount of urine Side effects that usually do not require medical attention (report to your doctor or health care professional if they continue or are bothersome): -bone pain -muscle pain This list may not describe all possible side effects. Call your doctor for medical advice about side effects. You may report side effects to FDA at 1-800-FDA-1088. Where should I keep my medicine? Keep out of the reach of children. Store pre-filled syringes in a refrigerator between 2 and 8 degrees C (36 and 46 degrees F). Do not freeze. Keep in carton to protect from light. Throw away this medicine if it is left out of the refrigerator for more than 48 hours. Throw away any unused medicine after the expiration date. NOTE: This sheet is a summary. It may not cover all possible information. If you have questions about this medicine, talk to your doctor, pharmacist, or health care provider.    2016, [employer]. (2014-06-27 14:30:14)

## 2015-05-27 ENCOUNTER — Other Ambulatory Visit (HOSPITAL_COMMUNITY): Payer: Self-pay | Admitting: Oncology

## 2015-05-27 ENCOUNTER — Other Ambulatory Visit: Payer: Self-pay | Admitting: Radiology

## 2015-05-27 ENCOUNTER — Encounter (HOSPITAL_COMMUNITY): Payer: Medicare Other

## 2015-05-27 ENCOUNTER — Encounter (HOSPITAL_COMMUNITY): Payer: Medicare Other | Attending: Hematology & Oncology | Admitting: Hematology & Oncology

## 2015-05-27 ENCOUNTER — Ambulatory Visit (HOSPITAL_COMMUNITY)
Admission: RE | Admit: 2015-05-27 | Discharge: 2015-05-27 | Disposition: A | Discharge status: Home / Self Care | Payer: Medicare Other | Source: Ambulatory Visit | Attending: Hematology & Oncology | Admitting: Hematology & Oncology

## 2015-05-27 ENCOUNTER — Encounter (HOSPITAL_COMMUNITY): Payer: Self-pay | Admitting: Hematology & Oncology

## 2015-05-27 VITALS — BP 158/68 | HR 88 | Temp 98.4°F | Resp 18 | Wt 207.0 lb

## 2015-05-27 DIAGNOSIS — C8203 Follicular lymphoma grade I, intra-abdominal lymph nodes: Secondary | ICD-10-CM

## 2015-05-27 DIAGNOSIS — C8330 Diffuse large B-cell lymphoma, unspecified site: Secondary | ICD-10-CM | POA: Diagnosis not present

## 2015-05-27 DIAGNOSIS — N179 Acute kidney failure, unspecified: Secondary | ICD-10-CM | POA: Diagnosis not present

## 2015-05-27 DIAGNOSIS — C8290 Follicular lymphoma, unspecified, unspecified site: Secondary | ICD-10-CM | POA: Insufficient documentation

## 2015-05-27 DIAGNOSIS — J449 Chronic obstructive pulmonary disease, unspecified: Secondary | ICD-10-CM

## 2015-05-27 DIAGNOSIS — E1140 Type 2 diabetes mellitus with diabetic neuropathy, unspecified: Secondary | ICD-10-CM

## 2015-05-27 DIAGNOSIS — I10 Essential (primary) hypertension: Secondary | ICD-10-CM | POA: Diagnosis not present

## 2015-05-27 DIAGNOSIS — Z08 Encounter for follow-up examination after completed treatment for malignant neoplasm: Secondary | ICD-10-CM | POA: Diagnosis not present

## 2015-05-27 DIAGNOSIS — Z09 Encounter for follow-up examination after completed treatment for conditions other than malignant neoplasm: Secondary | ICD-10-CM

## 2015-05-27 DIAGNOSIS — E1122 Type 2 diabetes mellitus with diabetic chronic kidney disease: Secondary | ICD-10-CM | POA: Insufficient documentation

## 2015-05-27 DIAGNOSIS — B961 Klebsiella pneumoniae [K. pneumoniae] as the cause of diseases classified elsewhere: Secondary | ICD-10-CM

## 2015-05-27 DIAGNOSIS — E119 Type 2 diabetes mellitus without complications: Secondary | ICD-10-CM

## 2015-05-27 DIAGNOSIS — R600 Localized edema: Secondary | ICD-10-CM | POA: Diagnosis not present

## 2015-05-27 DIAGNOSIS — N183 Chronic kidney disease, stage 3 (moderate): Secondary | ICD-10-CM | POA: Diagnosis not present

## 2015-05-27 DIAGNOSIS — C8590 Non-Hodgkin lymphoma, unspecified, unspecified site: Secondary | ICD-10-CM | POA: Insufficient documentation

## 2015-05-27 DIAGNOSIS — G629 Polyneuropathy, unspecified: Secondary | ICD-10-CM

## 2015-05-27 DIAGNOSIS — R590 Localized enlarged lymph nodes: Secondary | ICD-10-CM | POA: Insufficient documentation

## 2015-05-27 DIAGNOSIS — N189 Chronic kidney disease, unspecified: Secondary | ICD-10-CM | POA: Insufficient documentation

## 2015-05-27 DIAGNOSIS — Z9221 Personal history of antineoplastic chemotherapy: Secondary | ICD-10-CM | POA: Insufficient documentation

## 2015-05-27 MED ORDER — PREDNISONE 20 MG PO TABS: ORAL_TABLET | ORAL | Status: AC

## 2015-05-27 NOTE — Patient Instructions (Addendum)
[facility] Discharge Instructions  RECOMMENDATIONS MADE BY THE CONSULTANT AND ANY TEST RESULTS WILL BE SENT TO YOUR REFERRING PHYSICIAN.   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 We scan after two cycles. You will loose your hair Hildred Alamin will do your chemo teaching on your new drugs Please call the clinic if you have any questions or concerns    Thank you for choosing [facility] to provide your oncology and hematology care.  To afford each patient quality time with our provider, please arrive at least 15 minutes before your scheduled appointment time.    You need to re-schedule your appointment should you arrive 10 or more minutes late.  We strive to give you quality time with our providers, and arriving late affects you and other patients whose appointments are after yours.  Also, if you no show three or more times for appointments you may be dismissed from the clinic at the providers discretion.     Again, thank you for choosing [facility].  Our hope is that these requests will decrease the amount of time that you wait before being seen by our physicians.       _____________________________________________________________  Should you have questions after your visit to [facility], please contact our office at [phone] between the hours of 8:30 a.m. and 4:30 p.m.  Voicemails left after 4:30 p.m. will not be returned until the following business day.  For prescription refill requests, have your pharmacy contact our office.     Rituximab injection What is this medicine? RITUXIMAB (ri TUX i mab) is a monoclonal antibody. It is used commonly to treat non-Hodgkin lymphoma and other conditions. It is also used to treat rheumatoid arthritis (RA).  In RA, this medicine slows the inflammatory process and help reduce joint pain and swelling. This medicine is often used with other cancer or arthritis medications. This medicine may be used for other purposes; ask your health care provider or pharmacist if you have questions. What should I tell my health care provider before I take this medicine? They need to know if you have any of these conditions: -blood disorders -heart disease -history of hepatitis B -infection (especially a virus infection such as chickenpox, cold sores, or herpes) -irregular heartbeat -kidney disease -lung or breathing disease, like asthma -lupus -an unusual or allergic reaction to rituximab, mouse proteins, other medicines, foods, dyes, or preservatives -pregnant or trying to get pregnant -breast-feeding How should I use this medicine? This medicine is for infusion into a vein. It is administered in a hospital or clinic by a specially trained health care professional. A special MedGuide will be given to you by the pharmacist with each prescription and refill. Be sure to read this information carefully each time. Talk to your pediatrician regarding the use of this medicine in children. This medicine is not approved for use in children. Overdosage: If you think you have taken too much of this medicine contact a poison control center or emergency room at once. NOTE: This medicine is only for you. Do not share this medicine with others. What if I miss a dose? It is important not to miss a dose. Call your doctor or health care professional if you are unable to  keep an appointment. What may interact with this medicine? -cisplatin -medicines for blood pressure -some other medicines for arthritis -vaccines This list may not describe all possible interactions. Give your health care provider a list of all the medicines, herbs, non-prescription drugs, or dietary supplements you use. Also tell them if you smoke, drink  alcohol, or use illegal drugs. Some items may interact with your medicine. What should I watch for while using this medicine? Report any side effects that you notice during your treatment right away, such as changes in your breathing, fever, chills, dizziness or lightheadedness. These effects are more common with the first dose. Visit your prescriber or health care professional for checks on your progress. You will need to have regular blood work. Report any other side effects. The side effects of this medicine can continue after you finish your treatment. Continue your course of treatment even though you feel ill unless your doctor tells you to stop. Call your doctor or health care professional for advice if you get a fever, chills or sore throat, or other symptoms of a cold or flu. Do not treat yourself. This drug decreases your body's ability to fight infections. Try to avoid being around people who are sick. This medicine may increase your risk to bruise or bleed. Call your doctor or health care professional if you notice any unusual bleeding. Be careful brushing and flossing your teeth or using a toothpick because you may get an infection or bleed more easily. If you have any dental work done, tell your dentist you are receiving this medicine. Avoid taking products that contain aspirin, acetaminophen, ibuprofen, naproxen, or ketoprofen unless instructed by your doctor. These medicines may hide a fever. Do not become pregnant while taking this medicine. Women should inform their doctor if they wish to become pregnant or think they might be pregnant. There is a potential for serious side effects to an unborn child. Talk to your health care professional or pharmacist for more information. Do not breast-feed an infant while taking this medicine. What side effects may I notice from receiving this medicine? Side effects that you should report to your doctor or health care professional as soon as  possible: -allergic reactions like skin rash, itching or hives, swelling of the face, lips, or tongue -low blood counts - this medicine may decrease the number of white blood cells, red blood cells and platelets. You may be at increased risk for infections and bleeding. -signs of infection - fever or chills, cough, sore throat, pain or difficulty passing urine -signs of decreased platelets or bleeding - bruising, pinpoint red spots on the skin, black, tarry stools, blood in the urine -signs of decreased red blood cells - unusually weak or tired, fainting spells, lightheadedness -breathing problems -confused, not responsive -chest pain -fast, irregular heartbeat -feeling faint or lightheaded, falls -mouth sores -redness, blistering, peeling or loosening of the skin, including inside the mouth -stomach pain -swelling of the ankles, feet, or hands -trouble passing urine or change in the amount of urine Side effects that usually do not require medical attention (report to your doctor or other health care professional if they continue or are bothersome): -anxiety -headache -loss of appetite -muscle aches -nausea -night sweats This list may not describe all possible side effects. Call your doctor for medical advice about side effects. You may report side effects to FDA at 1-800-FDA-1088. Where should I keep my medicine? This drug is given in a hospital or clinic and will not be stored  at home. NOTE: This sheet is a summary. It may not cover all possible information. If you have questions about this medicine, talk to your doctor, pharmacist, or health care provider.    2016, Elsevier/Gold Standard. (2014-08-14 22:30:56)   Vincristine injection What is this medicine? VINCRISTINE (vin KRIS teen) is a chemotherapy drug. It slows the growth of cancer cells. This medicine is used to treat many types of cancer like Hodgkin's disease, leukemia, non-Hodgkin's lymphoma, neuroblastoma (brain  cancer), rhabdomyosarcoma, and Wilms' tumor. This medicine may be used for other purposes; ask your health care provider or pharmacist if you have questions. What should I tell my health care provider before I take this medicine? They need to know if you have any of these conditions: -blood disorders -gout -infection (especially chickenpox, cold sores, or herpes) -kidney disease -liver disease -lung disease -nervous system disease like Charcot-Marie-Tooth (CMT) -recent or ongoing radiation therapy -an unusual or allergic reaction to vincristine, other chemotherapy agents, other medicines, foods, dyes, or preservatives -pregnant or trying to get pregnant -breast-feeding How should I use this medicine? This drug is given as an infusion into a vein. It is administered in a hospital or clinic by a specially trained health care professional. If you have pain, swelling, burning, or any unusual feeling around the site of your injection, tell your health care professional right away. Talk to your pediatrician regarding the use of this medicine in children. While this drug may be prescribed for selected conditions, precautions do apply. Overdosage: If you think you have taken too much of this medicine contact a poison control center or emergency room at once. NOTE: This medicine is only for you. Do not share this medicine with others. What if I miss a dose? It is important not to miss your dose. Call your doctor or health care professional if you are unable to keep an appointment. What may interact with this medicine? Do not take this medicine with any of the following medications: -itraconazole -mibefradil -voriconazole This medicine may also interact with the following medications: -cyclosporine -erythromycin -fluconazole -ketoconazole -medicines for HIV like delavirdine, efavirenz, nevirapine -medicines for seizures like ethotoin, fosphenotoin, phenytoin -medicines to increase blood  counts like filgrastim, pegfilgrastim, sargramostim -other chemotherapy drugs like cisplatin, L-asparaginase, methotrexate, mitomycin, paclitaxel -pegaspargase -vaccines -zalcitabine, ddC Talk to your doctor or health care professional before taking any of these medicines: -acetaminophen -aspirin -ibuprofen -ketoprofen -naproxen This list may not describe all possible interactions. Give your health care provider a list of all the medicines, herbs, non-prescription drugs, or dietary supplements you use. Also tell them if you smoke, drink alcohol, or use illegal drugs. Some items may interact with your medicine. What should I watch for while using this medicine? Your condition will be monitored carefully while you are receiving this medicine. You will need important blood work done while you are taking this medicine. This drug may make you feel generally unwell. This is not uncommon, as chemotherapy can affect healthy cells as well as cancer cells. Report any side effects. Continue your course of treatment even though you feel ill unless your doctor tells you to stop. In some cases, you may be given additional medicines to help with side effects. Follow all directions for their use. Call your doctor or health care professional for advice if you get a fever, chills or sore throat, or other symptoms of a cold or flu. Do not treat yourself. Avoid taking products that contain aspirin, acetaminophen, ibuprofen, naproxen, or ketoprofen unless instructed by your  doctor. These medicines may hide a fever. Do not become pregnant while taking this medicine. Women should inform their doctor if they wish to become pregnant or think they might be pregnant. There is a potential for serious side effects to an unborn child. Talk to your health care professional or pharmacist for more information. Do not breast-feed an infant while taking this medicine. Men may have a lower sperm count while taking this medicine.  Talk to your doctor if you plan to father a child. What side effects may I notice from receiving this medicine? Side effects that you should report to your doctor or health care professional as soon as possible: -allergic reactions like skin rash, itching or hives, swelling of the face, lips, or tongue -breathing problems -confusion or changes in emotions or moods -constipation -cough -mouth sores -muscle weakness -nausea and vomiting -pain, swelling, redness or irritation at the injection site -pain, tingling, numbness in the hands or feet -problems with balance, talking, walking -seizures -stomach pain -trouble passing urine or change in the amount of urine Side effects that usually do not require medical attention (report to your doctor or health care professional if they continue or are bothersome): -diarrhea -hair loss -jaw pain -loss of appetite This list may not describe all possible side effects. Call your doctor for medical advice about side effects. You may report side effects to FDA at 1-800-FDA-1088. Where should I keep my medicine? This drug is given in a hospital or clinic and will not be stored at home. NOTE: This sheet is a summary. It may not cover all possible information. If you have questions about this medicine, talk to your doctor, pharmacist, or health care provider.    2016, Elsevier/Gold Standard. (2008-03-04 17:17:13)   Doxorubicin injection What is this medicine? DOXORUBICIN (dox oh ROO bi sin) is a chemotherapy drug. It is used to treat many kinds of cancer like Hodgkin's disease, leukemia, non-Hodgkin's lymphoma, neuroblastoma, sarcoma, and Wilms' tumor. It is also used to treat bladder cancer, breast cancer, lung cancer, ovarian cancer, stomach cancer, and thyroid cancer. This medicine may be used for other purposes; ask your health care provider or pharmacist if you have questions. What should I tell my health care provider before I take this  medicine? They need to know if you have any of these conditions: -blood disorders -heart disease, recent heart attack -infection (especially a virus infection such as chickenpox, cold sores, or herpes) -irregular heartbeat -liver disease -recent or ongoing radiation therapy -an unusual or allergic reaction to doxorubicin, other chemotherapy agents, other medicines, foods, dyes, or preservatives -pregnant or trying to get pregnant -breast-feeding How should I use this medicine? This drug is given as an infusion into a vein. It is administered in a hospital or clinic by a specially trained health care professional. If you have pain, swelling, burning or any unusual feeling around the site of your injection, tell your health care professional right away. Talk to your pediatrician regarding the use of this medicine in children. Special care may be needed. Overdosage: If you think you have taken too much of this medicine contact a poison control center or emergency room at once. NOTE: This medicine is only for you. Do not share this medicine with others. What if I miss a dose? It is important not to miss your dose. Call your doctor or health care professional if you are unable to keep an appointment. What may interact with this medicine? Do not take this medicine with any  of the following medications: -cisapride -droperidol -halofantrine -pimozide -zidovudine This medicine may also interact with the following medications: -chloroquine -chlorpromazine -clarithromycin -cyclophosphamide -cyclosporine -erythromycin -medicines for depression, anxiety, or psychotic disturbances -medicines for irregular heart beat like amiodarone, bepridil, dofetilide, encainide, flecainide, propafenone, quinidine -medicines for seizures like ethotoin, fosphenytoin, phenytoin -medicines for nausea, vomiting like dolasetron, ondansetron, palonosetron -medicines to increase blood counts like filgrastim,  pegfilgrastim, sargramostim -methadone -methotrexate -pentamidine -progesterone -vaccines -verapamil Talk to your doctor or health care professional before taking any of these medicines: -acetaminophen -aspirin -ibuprofen -ketoprofen -naproxen This list may not describe all possible interactions. Give your health care provider a list of all the medicines, herbs, non-prescription drugs, or dietary supplements you use. Also tell them if you smoke, drink alcohol, or use illegal drugs. Some items may interact with your medicine. What should I watch for while using this medicine? Your condition will be monitored carefully while you are receiving this medicine. You will need important blood work done while you are taking this medicine. This drug may make you feel generally unwell. This is not uncommon, as chemotherapy can affect healthy cells as well as cancer cells. Report any side effects. Continue your course of treatment even though you feel ill unless your doctor tells you to stop. Your urine may turn red for a few days after your dose. This is not blood. If your urine is dark or brown, call your doctor. In some cases, you may be given additional medicines to help with side effects. Follow all directions for their use. Call your doctor or health care professional for advice if you get a fever, chills or sore throat, or other symptoms of a cold or flu. Do not treat yourself. This drug decreases your body's ability to fight infections. Try to avoid being around people who are sick. This medicine may increase your risk to bruise or bleed. Call your doctor or health care professional if you notice any unusual bleeding. Be careful brushing and flossing your teeth or using a toothpick because you may get an infection or bleed more easily. If you have any dental work done, tell your dentist you are receiving this medicine. Avoid taking products that contain aspirin, acetaminophen, ibuprofen, naproxen,  or ketoprofen unless instructed by your doctor. These medicines may hide a fever. Men and women of childbearing age should use effective birth control methods while using taking this medicine. Do not become pregnant while taking this medicine. There is a potential for serious side effects to an unborn child. Talk to your health care professional or pharmacist for more information. Do not breast-feed an infant while taking this medicine. Do not let others touch your urine or other body fluids for 5 days after each treatment with this medicine. Caregivers should wear latex gloves to avoid touching body fluids during this time. There is a maximum amount of this medicine you should receive throughout your life. The amount depends on the medical condition being treated and your overall health. Your doctor will watch how much of this medicine you receive in your lifetime. Tell your doctor if you have taken this medicine before. What side effects may I notice from receiving this medicine? Side effects that you should report to your doctor or health care professional as soon as possible: -allergic reactions like skin rash, itching or hives, swelling of the face, lips, or tongue -low blood counts - this medicine may decrease the number of white blood cells, red blood cells and platelets. You may be at  increased risk for infections and bleeding. -signs of infection - fever or chills, cough, sore throat, pain or difficulty passing urine -signs of decreased platelets or bleeding - bruising, pinpoint red spots on the skin, black, tarry stools, blood in the urine -signs of decreased red blood cells - unusually weak or tired, fainting spells, lightheadedness -breathing problems -chest pain -fast, irregular heartbeat -mouth sores -nausea, vomiting -pain, swelling, redness at site where injected -pain, tingling, numbness in the hands or feet -swelling of ankles, feet, or hands -unusual bleeding or bruising Side  effects that usually do not require medical attention (report to your doctor or health care professional if they continue or are bothersome): -diarrhea -facial flushing -hair loss -loss of appetite -missed menstrual periods -nail discoloration or damage -red or watery eyes -red colored urine -stomach upset This list may not describe all possible side effects. Call your doctor for medical advice about side effects. You may report side effects to FDA at 1-800-FDA-1088. Where should I keep my medicine? This drug is given in a hospital or clinic and will not be stored at home. NOTE: This sheet is a summary. It may not cover all possible information. If you have questions about this medicine, talk to your doctor, pharmacist, or health care provider.    2016, Elsevier/Gold Standard. (2012-10-03 09:54:34)   Cyclophosphamide injection What is this medicine? CYCLOPHOSPHAMIDE (sye kloe FOSS fa mide) is a chemotherapy drug. It slows the growth of cancer cells. This medicine is used to treat many types of cancer like lymphoma, myeloma, leukemia, breast cancer, and ovarian cancer, to name a few. This medicine may be used for other purposes; ask your health care provider or pharmacist if you have questions. What should I tell my health care provider before I take this medicine? They need to know if you have any of these conditions: -blood disorders -history of other chemotherapy -infection -kidney disease -liver disease -recent or ongoing radiation therapy -tumors in the bone marrow -an unusual or allergic reaction to cyclophosphamide, other chemotherapy, other medicines, foods, dyes, or preservatives -pregnant or trying to get pregnant -breast-feeding How should I use this medicine? This drug is usually given as an injection into a vein or muscle or by infusion into a vein. It is administered in a hospital or clinic by a specially trained health care professional. Talk to your pediatrician  regarding the use of this medicine in children. Special care may be needed. Overdosage: If you think you have taken too much of this medicine contact a poison control center or emergency room at once. NOTE: This medicine is only for you. Do not share this medicine with others. What if I miss a dose? It is important not to miss your dose. Call your doctor or health care professional if you are unable to keep an appointment. What may interact with this medicine? This medicine may interact with the following medications: -amiodarone -amphotericin B -azathioprine -certain antiviral medicines for HIV or AIDS such as protease inhibitors (e.g., indinavir, ritonavir) and zidovudine -certain blood pressure medications such as benazepril, captopril, enalapril, fosinopril, lisinopril, moexipril, monopril, perindopril, quinapril, ramipril, trandolapril -certain cancer medications such as anthracyclines (e.g., daunorubicin, doxorubicin), busulfan, cytarabine, paclitaxel, pentostatin, tamoxifen, trastuzumab -certain diuretics such as chlorothiazide, chlorthalidone, hydrochlorothiazide, indapamide, metolazone -certain medicines that treat or prevent blood clots like warfarin -certain muscle relaxants such as succinylcholine -cyclosporine -etanercept -indomethacin -medicines to increase blood counts like filgrastim, pegfilgrastim, sargramostim -medicines used as general anesthesia -metronidazole -natalizumab This list may not describe all possible  interactions. Give your health care provider a list of all the medicines, herbs, non-prescription drugs, or dietary supplements you use. Also tell them if you smoke, drink alcohol, or use illegal drugs. Some items may interact with your medicine. What should I watch for while using this medicine? Visit your doctor for checks on your progress. This drug may make you feel generally unwell. This is not uncommon, as chemotherapy can affect healthy cells as well as  cancer cells. Report any side effects. Continue your course of treatment even though you feel ill unless your doctor tells you to stop. Drink water or other fluids as directed. Urinate often, even at night. In some cases, you may be given additional medicines to help with side effects. Follow all directions for their use. Call your doctor or health care professional for advice if you get a fever, chills or sore throat, or other symptoms of a cold or flu. Do not treat yourself. This drug decreases your body's ability to fight infections. Try to avoid being around people who are sick. This medicine may increase your risk to bruise or bleed. Call your doctor or health care professional if you notice any unusual bleeding. Be careful brushing and flossing your teeth or using a toothpick because you may get an infection or bleed more easily. If you have any dental work done, tell your dentist you are receiving this medicine. You may get drowsy or dizzy. Do not drive, use machinery, or do anything that needs mental alertness until you know how this medicine affects you. Do not become pregnant while taking this medicine or for 1 year after stopping it. Women should inform their doctor if they wish to become pregnant or think they might be pregnant. Men should not father a child while taking this medicine and for 4 months after stopping it. There is a potential for serious side effects to an unborn child. Talk to your health care professional or pharmacist for more information. Do not breast-feed an infant while taking this medicine. This medicine may interfere with the ability to have a child. This medicine has caused ovarian failure in some women. This medicine has caused reduced sperm counts in some men. You should talk with your doctor or health care professional if you are concerned about your fertility. If you are going to have surgery, tell your doctor or health care professional that you have taken this  medicine. What side effects may I notice from receiving this medicine? Side effects that you should report to your doctor or health care professional as soon as possible: -allergic reactions like skin rash, itching or hives, swelling of the face, lips, or tongue -low blood counts - this medicine may decrease the number of white blood cells, red blood cells and platelets. You may be at increased risk for infections and bleeding. -signs of infection - fever or chills, cough, sore throat, pain or difficulty passing urine -signs of decreased platelets or bleeding - bruising, pinpoint red spots on the skin, black, tarry stools, blood in the urine -signs of decreased red blood cells - unusually weak or tired, fainting spells, lightheadedness -breathing problems -dark urine -dizziness -palpitations -swelling of the ankles, feet, hands -trouble passing urine or change in the amount of urine -weight gain -yellowing of the eyes or skin Side effects that usually do not require medical attention (report to your doctor or health care professional if they continue or are bothersome): -changes in nail or skin color -hair loss -missed menstrual periods -  mouth sores -nausea, vomiting This list may not describe all possible side effects. Call your doctor for medical advice about side effects. You may report side effects to FDA at 1-800-FDA-1088. Where should I keep my medicine? This drug is given in a hospital or clinic and will not be stored at home. NOTE: This sheet is a summary. It may not cover all possible information. If you have questions about this medicine, talk to your doctor, pharmacist, or health care provider.    2016, Elsevier/Gold Standard. (2012-04-21 16:22:58)   Prednisone tablets What is this medicine? PREDNISONE (PRED ni sone) is a corticosteroid. It is commonly used to treat inflammation of the skin, joints, lungs, and other organs. Common conditions treated include asthma,  allergies, and arthritis. It is also used for other conditions, such as blood disorders and diseases of the adrenal glands. This medicine may be used for other purposes; ask your health care provider or pharmacist if you have questions. What should I tell my health care provider before I take this medicine? They need to know if you have any of these conditions: -Cushing's syndrome -diabetes -glaucoma -heart disease -high blood pressure -infection (especially a virus infection such as chickenpox, cold sores, or herpes) -kidney disease -liver disease -mental illness -myasthenia gravis -osteoporosis -seizures -stomach or intestine problems -thyroid disease -an unusual or allergic reaction to lactose, prednisone, other medicines, foods, dyes, or preservatives -pregnant or trying to get pregnant -breast-feeding How should I use this medicine? Take this medicine by mouth with a glass of water. Follow the directions on the prescription label. Take this medicine with food. If you are taking this medicine once a day, take it in the morning. Do not take more medicine than you are told to take. Do not suddenly stop taking your medicine because you may develop a severe reaction. Your doctor will tell you how much medicine to take. If your doctor wants you to stop the medicine, the dose may be slowly lowered over time to avoid any side effects. Talk to your pediatrician regarding the use of this medicine in children. Special care may be needed. Overdosage: If you think you have taken too much of this medicine contact a poison control center or emergency room at once. NOTE: This medicine is only for you. Do not share this medicine with others. What if I miss a dose? If you miss a dose, take it as soon as you can. If it is almost time for your next dose, talk to your doctor or health care professional. You may need to miss a dose or take an extra dose. Do not take double or extra doses without  advice. What may interact with this medicine? Do not take this medicine with any of the following medications: -metyrapone -mifepristone This medicine may also interact with the following medications: -aminoglutethimide -amphotericin B -aspirin and aspirin-like medicines -barbiturates -certain medicines for diabetes, like glipizide or glyburide -cholestyramine -cholinesterase inhibitors -cyclosporine -digoxin -diuretics -ephedrine -male hormones, like estrogens and birth control pills -isoniazid -ketoconazole -NSAIDS, medicines for pain and inflammation, like ibuprofen or naproxen -phenytoin -rifampin -toxoids -vaccines -warfarin This list may not describe all possible interactions. Give your health care provider a list of all the medicines, herbs, non-prescription drugs, or dietary supplements you use. Also tell them if you smoke, drink alcohol, or use illegal drugs. Some items may interact with your medicine. What should I watch for while using this medicine? Visit your doctor or health care professional for regular checks on your progress.  If you are taking this medicine over a prolonged period, carry an identification card with your name and address, the type and dose of your medicine, and your doctor's name and address. This medicine may increase your risk of getting an infection. Tell your doctor or health care professional if you are around anyone with measles or chickenpox, or if you develop sores or blisters that do not heal properly. If you are going to have surgery, tell your doctor or health care professional that you have taken this medicine within the last twelve months. Ask your doctor or health care professional about your diet. You may need to lower the amount of salt you eat. This medicine may affect blood sugar levels. If you have diabetes, check with your doctor or health care professional before you change your diet or the dose of your diabetic medicine. What  side effects may I notice from receiving this medicine? Side effects that you should report to your doctor or health care professional as soon as possible: -allergic reactions like skin rash, itching or hives, swelling of the face, lips, or tongue -changes in emotions or moods -changes in vision -depressed mood -eye pain -fever or chills, cough, sore throat, pain or difficulty passing urine -increased thirst -swelling of ankles, feet Side effects that usually do not require medical attention (report to your doctor or health care professional if they continue or are bothersome): -confusion, excitement, restlessness -headache -nausea, vomiting -skin problems, acne, thin and shiny skin -trouble sleeping -weight gain This list may not describe all possible side effects. Call your doctor for medical advice about side effects. You may report side effects to FDA at 1-800-FDA-1088. Where should I keep my medicine? Keep out of the reach of children. Store at room temperature between 15 and 30 degrees C (59 and 86 degrees F). Protect from light. Keep container tightly closed. Throw away any unused medicine after the expiration date. NOTE: This sheet is a summary. It may not cover all possible information. If you have questions about this medicine, talk to your doctor, pharmacist, or health care provider.    2016, Elsevier/Gold Standard. (2011-01-21 10:57:14)

## 2015-05-27 NOTE — Progress Notes (Signed)
Chemo teaching instructions given and partially reviewed with patient. 2D echo performed today. Meds called into pharmacy. Pt to continue on Allopurinol 150mg  daily. Pt to pick up a refill of his Humalog priced @ $45 and Prednisone. Chemo teaching to be thoroughly reviewed with patient on Friday when he is here getting chemo. Port placement and biopsy to be performed on Thursday 05/29/15. Distress screening to be done on Friday also with chemo.

## 2015-05-28 ENCOUNTER — Other Ambulatory Visit: Payer: Self-pay | Admitting: Radiology

## 2015-05-29 ENCOUNTER — Encounter (HOSPITAL_COMMUNITY): Payer: Self-pay

## 2015-05-29 ENCOUNTER — Ambulatory Visit (HOSPITAL_COMMUNITY)
Admission: RE | Admit: 2015-05-29 | Discharge: 2015-05-29 | Disposition: A | Discharge status: Home / Self Care | Payer: Medicare Other | Source: Ambulatory Visit | Attending: Hematology & Oncology | Admitting: Hematology & Oncology

## 2015-05-29 ENCOUNTER — Ambulatory Visit (HOSPITAL_COMMUNITY)
Admission: RE | Admit: 2015-05-29 | Discharge: 2015-05-29 | Disposition: A | Discharge status: Home / Self Care | Payer: Medicare Other | Source: Ambulatory Visit | Attending: Oncology | Admitting: Oncology

## 2015-05-29 ENCOUNTER — Other Ambulatory Visit (HOSPITAL_COMMUNITY): Payer: Self-pay | Admitting: Hematology & Oncology

## 2015-05-29 DIAGNOSIS — Z794 Long term (current) use of insulin: Secondary | ICD-10-CM | POA: Diagnosis not present

## 2015-05-29 DIAGNOSIS — C8203 Follicular lymphoma grade I, intra-abdominal lymph nodes: Secondary | ICD-10-CM

## 2015-05-29 DIAGNOSIS — Z79899 Other long term (current) drug therapy: Secondary | ICD-10-CM | POA: Diagnosis not present

## 2015-05-29 DIAGNOSIS — G4733 Obstructive sleep apnea (adult) (pediatric): Secondary | ICD-10-CM | POA: Insufficient documentation

## 2015-05-29 DIAGNOSIS — D649 Anemia, unspecified: Secondary | ICD-10-CM | POA: Insufficient documentation

## 2015-05-29 DIAGNOSIS — C8330 Diffuse large B-cell lymphoma, unspecified site: Secondary | ICD-10-CM

## 2015-05-29 DIAGNOSIS — J449 Chronic obstructive pulmonary disease, unspecified: Secondary | ICD-10-CM | POA: Insufficient documentation

## 2015-05-29 DIAGNOSIS — Z9981 Dependence on supplemental oxygen: Secondary | ICD-10-CM | POA: Diagnosis not present

## 2015-05-29 HISTORY — DX: Non-Hodgkin lymphoma, unspecified, unspecified site: C85.90

## 2015-05-29 LAB — BASIC METABOLIC PANEL
ANION GAP: 10 (ref 5–15)
BUN: 14 mg/dL (ref 6–20)
CALCIUM: 9.3 mg/dL (ref 8.9–10.3)
CO2: 30 mmol/L (ref 22–32)
Chloride: 96 mmol/L — ABNORMAL LOW (ref 101–111)
Creatinine, Ser: 1.83 mg/dL — ABNORMAL HIGH (ref 0.61–1.24)
GFR calc Af Amer: 42 mL/min — ABNORMAL LOW (ref >60)
GFR calc non Af Amer: 36 mL/min — ABNORMAL LOW (ref >60)
GLUCOSE: 190 mg/dL — AB (ref 65–99)
Potassium: 4 mmol/L (ref 3.5–5.1)
Sodium: 136 mmol/L (ref 135–145)

## 2015-05-29 LAB — CBC
HEMATOCRIT: 37 % — AB (ref 39.0–52.0)
Hemoglobin: 12.6 g/dL — ABNORMAL LOW (ref 13.0–17.0)
MCH: 27.8 pg (ref 26.0–34.0)
MCHC: 34.1 g/dL (ref 30.0–36.0)
MCV: 81.7 fL (ref 78.0–100.0)
PLATELETS: 219 10*3/uL (ref 150–400)
RBC: 4.53 MIL/uL (ref 4.22–5.81)
RDW: 13.1 % (ref 11.5–15.5)
WBC: 6.4 10*3/uL (ref 4.0–10.5)

## 2015-05-29 LAB — BONE MARROW EXAM

## 2015-05-29 LAB — PROTIME-INR
INR: 1.07 (ref 0.00–1.49)
Prothrombin Time: 14.1 seconds (ref 11.6–15.2)

## 2015-05-29 LAB — APTT: APTT: 32 s (ref 24–37)

## 2015-05-29 LAB — GLUCOSE, CAPILLARY: Glucose-Capillary: 178 mg/dL — ABNORMAL HIGH (ref 65–99)

## 2015-05-29 MED ORDER — HEPARIN SOD (PORK) LOCK FLUSH 100 UNIT/ML IV SOLN
INTRAVENOUS | Status: AC | PRN
  Administered 2015-05-29: 500 [IU]

## 2015-05-29 MED ORDER — CEFAZOLIN SODIUM-DEXTROSE 2-3 GM-% IV SOLR
INTRAVENOUS | Status: AC
  Filled 2015-05-29: qty 50

## 2015-05-29 MED ORDER — HEPARIN SOD (PORK) LOCK FLUSH 100 UNIT/ML IV SOLN
INTRAVENOUS | Status: AC
  Filled 2015-05-29: qty 5

## 2015-05-29 MED ORDER — FENTANYL CITRATE (PF) 100 MCG/2ML IJ SOLN
INTRAMUSCULAR | Status: AC
  Filled 2015-05-29: qty 4

## 2015-05-29 MED ORDER — MIDAZOLAM HCL 2 MG/2ML IJ SOLN
INTRAMUSCULAR | Status: AC | PRN
  Administered 2015-05-29: 1 mg via INTRAVENOUS

## 2015-05-29 MED ORDER — FENTANYL CITRATE (PF) 100 MCG/2ML IJ SOLN
INTRAMUSCULAR | Status: AC | PRN
  Administered 2015-05-29: 25 ug via INTRAVENOUS

## 2015-05-29 MED ORDER — CEFAZOLIN SODIUM-DEXTROSE 2-3 GM-% IV SOLR
2.0000 g | INTRAVENOUS | Status: AC
  Administered 2015-05-29: 2 g via INTRAVENOUS

## 2015-05-29 MED ORDER — LIDOCAINE-EPINEPHRINE 2 %-1:100000 IJ SOLN
INTRAMUSCULAR | Status: AC
  Filled 2015-05-29: qty 1

## 2015-05-29 MED ORDER — MIDAZOLAM HCL 2 MG/2ML IJ SOLN
INTRAMUSCULAR | Status: AC
  Filled 2015-05-29: qty 6

## 2015-05-29 MED ORDER — SODIUM CHLORIDE 0.9 % IV SOLN
Freq: Once | INTRAVENOUS | Status: AC
  Administered 2015-05-29: 07:00:00 via INTRAVENOUS

## 2015-05-29 MED ORDER — MIDAZOLAM HCL 2 MG/2ML IJ SOLN
INTRAMUSCULAR | Status: AC | PRN
  Administered 2015-05-29: 0.5 mg via INTRAVENOUS

## 2015-05-29 NOTE — Discharge Instructions (Signed)
Moderate Conscious Sedation, Adult, Care After Refer to this sheet in the next few weeks. These instructions provide you with information on caring for yourself after your procedure. Your health care provider may also give you more specific instructions. Your treatment has been planned according to current medical practices, but problems sometimes occur. Call your health care provider if you have any problems or questions after your procedure. WHAT TO EXPECT AFTER THE PROCEDURE  After your procedure:  You may feel sleepy, clumsy, and have poor balance for several hours.  Vomiting may occur if you eat too soon after the procedure. HOME CARE INSTRUCTIONS  Do not participate in any activities where you could become injured for at least 24 hours. Do not:  Drive.  Swim.  Ride a bicycle.  Operate heavy machinery.  Cook.  Use power tools.  Climb ladders.  Work from a high place.  Do not make important decisions or sign legal documents until you are improved.  If you vomit, drink water, juice, or soup when you can drink without vomiting. Make sure you have little or no nausea before eating solid foods.  Only take over-the-counter or prescription medicines for pain, discomfort, or fever as directed by your health care provider.  Make sure you and your family fully understand everything about the medicines given to you, including what side effects may occur.  You should not drink alcohol, take sleeping pills, or take medicines that cause drowsiness for at least 24 hours.  If you smoke, do not smoke without supervision.  If you are feeling better, you may resume normal activities 24 hours after you were sedated.  Keep all appointments with your health care provider. SEEK MEDICAL CARE IF:  Your skin is pale or bluish in color.  You continue to feel nauseous or vomit.  Your pain is getting worse and is not helped by medicine.  You have bleeding or swelling.  You are still  sleepy or feeling clumsy after 24 hours. SEEK IMMEDIATE MEDICAL CARE IF:  You develop a rash.  You have difficulty breathing.  You develop any type of allergic problem.  You have a fever. MAKE SURE YOU:  Understand these instructions.  Will watch your condition.  Will get help right away if you are not doing well or get worse.   This information is not intended to replace advice given to you by your health care provider. Make sure you discuss any questions you have with your health care provider.   Document Released: 03/28/2013 Document Revised: 06/28/2014 Document Reviewed: 03/28/2013 Elsevier Interactive Patient Education 2016 [place] Insertion, Care After Refer to this sheet in the next few weeks. These instructions provide you with information on caring for yourself after your procedure. Your health care provider may also give you more specific instructions. Your treatment has been planned according to current medical practices, but problems sometimes occur. Call your health care provider if you have any problems or questions after your procedure. WHAT TO EXPECT AFTER THE PROCEDURE After your procedure, it is typical to have the following:  Discomfort at the port insertion site. Ice packs to the area will help. Bruising on the skin over the port. This will subside in 3-4 days. HOME CARE INSTRUCTIONS After your port is placed, you will get a manufacturer's information card. The card has information about your port. Keep this card with you at all times.  Know what kind of port you have. There are many types of ports  available.  Wear a medical alert bracelet in case of an emergency. This can help alert health care workers that you have a port.  The port can stay in for as long as your health care provider believes it is necessary.  A home health care nurse may give medicines and take care of the port.  You or a family member can get special training  and directions for giving medicine and taking care of the port at home.  SEEK MEDICAL CARE IF:  Your port does not flush or you are unable to get a blood return.  You have a fever or chills. SEEK IMMEDIATE MEDICAL CARE IF: You have new fluid or pus coming from your incision.  You notice a bad smell coming from your incision site.  You have swelling, pain, or more redness at the incision or port site.  You have chest pain or shortness of breath.   This information is not intended to replace advice given to you by your health care provider. Make sure you discuss any questions you have with your health care provider.   Document Released: 03/28/2013 Document Revised: 06/12/2013 Document Reviewed: 03/28/2013 Elsevier Interactive Patient Education 2016 [place] An implanted port is a type of central line that is placed under the skin. Central lines are used to provide IV access when treatment or nutrition needs to be given through a person's veins. Implanted ports are used for long-term IV access. An implanted port may be placed because:  You need IV medicine that would be irritating to the small veins in your hands or arms.  You need long-term IV medicines, such as antibiotics.  You need IV nutrition for a long period.  You need frequent blood draws for lab tests.  You need dialysis.  Implanted ports are usually placed in the chest area, but they can also be placed in the upper arm, the abdomen, or the leg. An implanted port has two main parts:  Reservoir. The reservoir is round and will appear as a small, raised area under your skin. The reservoir is the part where a needle is inserted to give medicines or draw blood.  Catheter. The catheter is a thin, flexible tube that extends from the reservoir. The catheter is placed into a large vein. Medicine that is inserted into the reservoir goes into the catheter and then into the vein.  HOW WILL I CARE FOR MY  INCISION SITE? Do not get the incision site wet. Bathe or shower as directed by your health care provider.  HOW IS MY PORT ACCESSED? Special steps must be taken to access the port:  Before the port is accessed, a numbing cream can be placed on the skin. This helps numb the skin over the port site.  Your health care provider uses a sterile technique to access the port. Your health care provider must put on a mask and sterile gloves. The skin over your port is cleaned carefully with an antiseptic and allowed to dry. The port is gently pinched between sterile gloves, and a needle is inserted into the port. Only "non-coring" port needles should be used to access the port. Once the port is accessed, a blood return should be checked. This helps ensure that the port is in the vein and is not clogged.  If your port needs to remain accessed for a constant infusion, a clear (transparent) bandage will be placed over the needle site. The bandage and needle will need to  be changed every week, or as directed by your health care provider.  Keep the bandage covering the needle clean and dry. Do not get it wet. Follow your health care provider's instructions on how to take a shower or bath while the port is accessed.  If your port does not need to stay accessed, no bandage is needed over the port.  WHAT IS FLUSHING? Flushing helps keep the port from getting clogged. Follow your health care provider's instructions on how and when to flush the port. Ports are usually flushed with saline solution or a medicine called heparin. The need for flushing will depend on how the port is used.  If the port is used for intermittent medicines or blood draws, the port will need to be flushed:  After medicines have been given.  After blood has been drawn.  As part of routine maintenance.  If a constant infusion is running, the port may not need to be flushed.  HOW LONG WILL MY PORT STAY IMPLANTED? The port can stay in  for as long as your health care provider thinks it is needed. When it is time for the port to come out, surgery will be done to remove it. The procedure is similar to the one performed when the port was put in.  WHEN SHOULD I SEEK IMMEDIATE MEDICAL CARE? When you have an implanted port, you should seek immediate medical care if:  You notice a bad smell coming from the incision site.  You have swelling, redness, or drainage at the incision site.  You have more swelling or pain at the port site or the surrounding area.  You have a fever that is not controlled with medicine.   This information is not intended to replace advice given to you by your health care provider. Make sure you discuss any questions you have with your health care provider.   Document Released: 06/07/2005 Document Revised: 03/28/2013 Document Reviewed: 02/12/2013 Elsevier Interactive Patient Education 2016 [place]. Bone Marrow Aspiration and Bone Marrow Biopsy, Care After Refer to this sheet in the next few weeks. These instructions provide you with information about caring for yourself after your procedure. Your health care provider may also give you more specific instructions. Your treatment has been planned according to current medical practices, but problems sometimes occur. Call your health care provider if you have any problems or questions after your procedure. WHAT TO EXPECT AFTER THE PROCEDURE After your procedure, it is common to have:  Soreness or tenderness around the puncture site.  Bruising. HOME CARE INSTRUCTIONS  Take medicines only as directed by your health care provider.  Follow your health care provider's instructions about:  Puncture site care.  Bandage (dressing) changes and removal.  Bathe and shower as directed by your health care provider.  Check your puncture site every day for signs of infection. Watch for:  Redness, swelling, or pain.  Fluid, blood, or pus.  Return to your  normal activities as directed by your health care provider.  Keep all follow-up visits as directed by your health care provider. This is important. SEEK MEDICAL CARE IF:  You have a fever.  You have uncontrollable bleeding.  You have redness, swelling, or pain at the site of your puncture.  You have fluid, blood, or pus coming from your puncture site.   This information is not intended to replace advice given to you by your health care provider. Make sure you discuss any questions you have with your health care provider.  Document Released: 12/25/2004 Document Revised: 10/22/2014 Document Reviewed: 05/29/2014 Elsevier Interactive Patient Education 2016 [place]. Bone Marrow Aspiration and Bone Marrow Biopsy Bone marrow aspiration and bone marrow biopsy are procedures that are done to diagnose blood disorders. You may also have one of these procedures to help diagnose infections or some types of cancer. Bone marrow is the soft tissue that is inside your bones. Blood cells are produced in bone marrow. For bone marrow aspiration, a sample of tissue in liquid form is removed from inside your bone. For a bone marrow biopsy, a small core of bone marrow tissue is removed. Then these samples are examined under a microscope or tested in a lab. You may need these procedures if you have an abnormal complete blood count (CBC). The aspiration or biopsy sample is usually taken from the top of your hip bone. Sometimes, an aspiration sample is taken from your chest bone (sternum). LET [facility] CARE PROVIDER KNOW ABOUT:  Any allergies you have.  All medicines you are taking, including vitamins, herbs, eye drops, creams, and over-the-counter medicines.  Previous problems you or members of your family have had with the use of anesthetics.  Any blood disorders you have.  Previous surgeries you have had.  Any medical conditions you may have.  Whether you are pregnant or you think that you may  be pregnant. RISKS AND COMPLICATIONS Generally, this is a safe procedure. However, problems may occur, including:  Infection.  Bleeding. BEFORE THE PROCEDURE  Ask your health care provider about:  Changing or stopping your regular medicines. This is especially important if you are taking diabetes medicines or blood thinners.  Taking medicines such as aspirin and ibuprofen. These medicines can thin your blood. Do not take these medicines before your procedure if your health care provider instructs you not to.  Plan to have someone take you home after the procedure.  If you go home right after the procedure, plan to have someone with you for 24 hours. PROCEDURE   An IV tube may be inserted into one of your veins.  The injection site will be cleaned with a germ-killing solution (antiseptic).  You will be given one or more of the following:  A medicine that helps you relax (sedative).  A medicine that numbs the area (local anesthetic).  The bone marrow sample will be removed as follows:  For an aspiration, a hollow needle will be inserted through your skin and into your bone. Bone marrow fluid will be drawn up into a syringe.  For a biopsy, your health care provider will use a hollow needle to remove a core of tissue from your bone marrow.  The needle will be removed.  A bandage (dressing) will be placed over the insertion site and taped in place. The procedure may vary among health care providers and hospitals. AFTER THE PROCEDURE  Your blood pressure, heart rate, breathing rate, and blood oxygen level will be monitored often until the medicines you were given have worn off.  Return to your normal activities as directed by your health care provider.   This information is not intended to replace advice given to you by your health care provider. Make sure you discuss any questions you have with your health care provider.   Document Released: 06/10/2004 Document Revised:  10/22/2014 Document Reviewed: 05/29/2014 Elsevier Interactive Patient Education [employer].

## 2015-05-29 NOTE — Procedures (Signed)
R IJ Port cathter placement with US and fluoroscopy No complication No blood loss. See complete dictation in [facility].  

## 2015-05-29 NOTE — Procedures (Signed)
CT-guided  R iliac bone marrow aspiration and core biopsy No complication No blood loss. See complete dictation in [facility] PACS  

## 2015-05-29 NOTE — Discharge Instructions (Signed)

## 2015-05-29 NOTE — H&P (Signed)
HPI: Patient with stage IIX Non-Hodgkin's lymphoma, low grade B cell lymphoma who has been seen by Dr. Whitney Muse on 05/26/15 and scheduled today for image guided bone marrow biopsy and port a catheter placement.  The patient has had a H&P performed within the last 30 days, all history, medications, and exam have been reviewed. The patient denies any interval changes since the H&P.  Medications: Prior to Admission medications   Medication Sig Start Date End Date Taking? Authorizing Provider  acetaminophen (TYLENOL) 500 MG tablet Take 500 mg by mouth every 6 (six) hours as needed for moderate pain.    Historical Provider, MD  albuterol (PROVENTIL HFA;VENTOLIN HFA) 108 (90 BASE) MCG/ACT inhaler Inhale 2 puffs into the lungs every 6 (six) hours as needed for wheezing or shortness of breath.    Historical Provider, MD  allopurinol (ZYLOPRIM) 300 MG tablet Take 150 mg by mouth daily.    Historical Provider, MD  amLODipine (NORVASC) 5 MG tablet Take 1 tablet (5 mg total) by mouth daily. 01/08/15   Patrici Ranks, MD  budesonide-formoterol (SYMBICORT) 160-4.5 MCG/ACT inhaler Inhale 2 puffs into the lungs 2 (two) times daily.     Historical Provider, MD  cholecalciferol (VITAMIN D) 1000 UNITS tablet Take 1,000 Units by mouth daily.    Historical Provider, MD  citalopram (CELEXA) 40 MG tablet Take 40 mg by mouth daily.    Historical Provider, MD  Cyclophosphamide (CYTOXAN IJ) Inject as directed every 21 ( twenty-one) days. To begin 05/30/15    Historical Provider, MD  DOXOrubicin HCl (ADRIAMYCIN IV) Inject into the vein every 21 ( twenty-one) days. To begin 05/30/15    Historical Provider, MD  HYDROcodone-acetaminophen [facility]) 10-325 MG tablet May take 1/2 tab or 1 tab every 6 hours prn pain. 05/23/15   Baird Cancer, PA-C  insulin glargine (LANTUS) 100 UNIT/ML injection Inject 0.35 mLs (35 Units total) into the skin at bedtime. 03/22/15   Samuella Cota, MD  insulin lispro (HUMALOG) 100 UNIT/ML  injection Inject into the skin. Check blood sugar three times a day and administer Humalog insulin as ordered: 151-200 = 2 units,   201-250 = 4 units,      251-300 = 6 units,   Greater than or equal to 301 = 8 units and call MD    Historical Provider, MD  lidocaine-prilocaine (EMLA) cream Apply a quarter size amount to port site 1 hour prior to chemo. Do not rub in. Cover with plastic wrap. 05/26/15   Patrici Ranks, MD  ondansetron (ZOFRAN) 8 MG tablet Take 1 tablet (8 mg total) by mouth every 8 (eight) hours as needed for nausea or vomiting. Patient not taking: Reported on 05/27/2015 05/26/15   Patrici Ranks, MD  OXYGEN Inhale 3 L into the lungs continuous.    Historical Provider, MD  Pegfilgrastim (NEULASTA ONPRO [place]) Inject into the skin every 21 ( twenty-one) days. To be applied after the completion of chemo    Historical Provider, MD  pravastatin (PRAVACHOL) 40 MG tablet Take 40 mg by mouth every evening.    Historical Provider, MD  predniSONE (DELTASONE) 20 MG tablet On Days 1-5 of chemo take 3 tabs (82m total). 05/27/15   SPatrici Ranks MD  prochlorperazine (COMPAZINE) 10 MG tablet Take 1 tablet (10 mg total) by mouth every 6 (six) hours as needed for nausea or vomiting. Patient not taking: Reported on 05/27/2015 05/26/15   SPatrici Ranks MD  RiTUXimab (RITUXAN IV) Inject into the vein  every 21 ( twenty-one) days. To begin 05/30/15    Historical Provider, MD  tamsulosin (FLOMAX) 0.4 MG CAPS capsule Take 0.4 mg by mouth at bedtime.    Historical Provider, MD  tiotropium (SPIRIVA) 18 MCG inhalation capsule Place 18 mcg into inhaler and inhale daily. 2 puffs daily    Historical Provider, MD  traMADol (ULTRAM) 50 MG tablet Take 1 tablet (50 mg total) by mouth every 6 (six) hours as needed. 05/22/15   Patrici Ranks, MD  VINCRISTINE SULFATE IV Inject into the vein every 21 ( twenty-one) days. To begin 05/30/15    Historical Provider, MD  zolpidem (AMBIEN) 10 MG tablet Take 10 mg by mouth  at bedtime as needed for sleep.     Historical Provider, MD     Vital Signs: T: 98.4 F, BP: 139/64 mmHg, HR: 79 bpm, O2: 98% 3L  Physical Exam  Constitutional: He is oriented to person, place, and time. No distress.  HENT:  Head: Normocephalic and atraumatic.  Cardiovascular: Normal rate and regular rhythm.  Exam reveals no gallop and no friction rub.   No murmur heard. Pulmonary/Chest: Effort normal and breath sounds normal. He has no wheezes. He has no rales.  Abdominal: Soft. Bowel sounds are normal. He exhibits no distension. There is no tenderness.  Neurological: He is alert and oriented to person, place, and time.  Skin: Skin is warm and dry. He is not diaphoretic.    Mallampati Score:  MD Evaluation Airway: WNL Heart: WNL Abdomen: WNL Chest/ Lungs: WNL ASA  Classification: 3 Mallampati/Airway Score: Two  Labs:  CBC:  Recent Labs  03/20/15 0545 04/01/15 0835 05/19/15 0740 05/29/15 0730  WBC 5.6 4.5 4.9 6.4  HGB 11.9* 11.9* 13.1 12.6*  HCT 37.0* 36.4* 38.3* 37.0*  PLT 219 182 216 219    COAGS:  Recent Labs  12/05/14 0622 05/19/15 0740 05/29/15 0730  INR 1.19 1.08 1.07  APTT 31 30 32    BMP:  Recent Labs  03/20/15 0545 03/22/15 0608 04/01/15 0835 05/29/15 0730  NA 138 136 137 136  K 4.2 4.5 4.0 4.0  CL 95* 93* 98* 96*  CO2 36* 37* 33* 30  GLUCOSE 168* 173* 110* 190*  BUN 32* 40* 17 14  CALCIUM 8.9 8.2* 8.0* 9.3  CREATININE 1.63* 1.48* 1.78* 1.83*  GFRNONAA 42* 47* 37* 36*  GFRAA 48* 54* 43* 42*    LIVER FUNCTION TESTS:  Recent Labs  02/12/15 0830 03/12/15 0930 03/22/15 0608 04/01/15 0835  BILITOT 0.4 0.3 0.4 0.4  AST 14* 17 18 21   ALT 11* 13* 14* 24  ALKPHOS 57 72 46 58  PROT 6.1* 6.0* 5.7* 5.3*  ALBUMIN 3.1* 3.1* 2.7* 2.7*    Assessment/Plan:  Stage IIX Non-Hodgkin's lymphoma, low grade B cell lymphoma Seen by Dr. Whitney Muse on 05/26/15 Scheduled today for image guided bone marrow biopsy and port a catheter placement with  sedation COPD on chronic o2 3L OSA, uses CPAP The patient has been NPO, no blood thinners taken, labs and vitals have been reviewed. Risks and Benefits discussed with the patient including, but not limited to bleeding, infection, pneumothorax, or fibrin sheath development and need for additional procedures. All of the patient's questions were answered, patient is agreeable to proceed. Consent signed and in chart.    SignedHedy Jacob 05/29/2015, 8:38 AM

## 2015-05-30 ENCOUNTER — Encounter (HOSPITAL_BASED_OUTPATIENT_CLINIC_OR_DEPARTMENT_OTHER): Payer: Medicare Other

## 2015-05-30 VITALS — BP 138/72 | HR 68 | Temp 97.9°F | Resp 18 | Wt 204.4 lb

## 2015-05-30 DIAGNOSIS — Z5111 Encounter for antineoplastic chemotherapy: Secondary | ICD-10-CM | POA: Diagnosis not present

## 2015-05-30 DIAGNOSIS — Z5112 Encounter for antineoplastic immunotherapy: Secondary | ICD-10-CM

## 2015-05-30 DIAGNOSIS — R590 Localized enlarged lymph nodes: Secondary | ICD-10-CM | POA: Diagnosis present

## 2015-05-30 DIAGNOSIS — C8290 Follicular lymphoma, unspecified, unspecified site: Secondary | ICD-10-CM | POA: Diagnosis present

## 2015-05-30 DIAGNOSIS — C8590 Non-Hodgkin lymphoma, unspecified, unspecified site: Secondary | ICD-10-CM | POA: Diagnosis not present

## 2015-05-30 DIAGNOSIS — N189 Chronic kidney disease, unspecified: Secondary | ICD-10-CM | POA: Diagnosis present

## 2015-05-30 DIAGNOSIS — C8330 Diffuse large B-cell lymphoma, unspecified site: Secondary | ICD-10-CM

## 2015-05-30 DIAGNOSIS — E1122 Type 2 diabetes mellitus with diabetic chronic kidney disease: Secondary | ICD-10-CM | POA: Diagnosis present

## 2015-05-30 LAB — CBC WITH DIFFERENTIAL/PLATELET
BASOS ABS: 0 10*3/uL (ref 0.0–0.1)
Basophils Relative: 0 %
Eosinophils Absolute: 0.6 10*3/uL (ref 0.0–0.7)
Eosinophils Relative: 8 %
HEMATOCRIT: 33.6 % — AB (ref 39.0–52.0)
Hemoglobin: 11.5 g/dL — ABNORMAL LOW (ref 13.0–17.0)
LYMPHS PCT: 14 %
Lymphs Abs: 1 10*3/uL (ref 0.7–4.0)
MCH: 27.8 pg (ref 26.0–34.0)
MCHC: 34.2 g/dL (ref 30.0–36.0)
MCV: 81.2 fL (ref 78.0–100.0)
MONO ABS: 0.7 10*3/uL (ref 0.1–1.0)
MONOS PCT: 10 %
NEUTROS ABS: 5.1 10*3/uL (ref 1.7–7.7)
Neutrophils Relative %: 68 %
Platelets: 175 10*3/uL (ref 150–400)
RBC: 4.14 MIL/uL — ABNORMAL LOW (ref 4.22–5.81)
RDW: 13.1 % (ref 11.5–15.5)
WBC: 7.5 10*3/uL (ref 4.0–10.5)

## 2015-05-30 LAB — LACTATE DEHYDROGENASE: LDH: 252 U/L — ABNORMAL HIGH (ref 98–192)

## 2015-05-30 MED ORDER — SODIUM CHLORIDE 0.9 % IV SOLN
Freq: Once | INTRAVENOUS | Status: AC
  Administered 2015-05-30: 09:00:00 via INTRAVENOUS

## 2015-05-30 MED ORDER — HEPARIN SOD (PORK) LOCK FLUSH 100 UNIT/ML IV SOLN
500.0000 [IU] | Freq: Once | INTRAVENOUS | Status: AC | PRN
  Administered 2015-05-30: 500 [IU]

## 2015-05-30 MED ORDER — SODIUM CHLORIDE 0.9 % IJ SOLN
10.0000 mL | INTRAMUSCULAR | Status: DC | PRN
  Administered 2015-05-30: 10 mL
  Filled 2015-05-30: qty 10

## 2015-05-30 MED ORDER — DOXORUBICIN HCL CHEMO IV INJECTION 2 MG/ML
50.0000 mg/m2 | Freq: Once | INTRAVENOUS | Status: AC
Start: 2015-05-30 — End: 2015-05-30
  Administered 2015-05-30: 106 mg via INTRAVENOUS
  Filled 2015-05-30: qty 53

## 2015-05-30 MED ORDER — SODIUM CHLORIDE 0.9 % IV SOLN
150.0000 mg | Freq: Once | INTRAVENOUS | Status: AC
  Administered 2015-05-30: 150 mg via INTRAVENOUS
  Filled 2015-05-30: qty 5

## 2015-05-30 MED ORDER — VINCRISTINE SULFATE CHEMO INJECTION 1 MG/ML
1.0000 mg | Freq: Once | INTRAVENOUS | Status: AC
  Administered 2015-05-30: 1 mg via INTRAVENOUS
  Filled 2015-05-30: qty 1

## 2015-05-30 MED ORDER — ACETAMINOPHEN 325 MG PO TABS
650.0000 mg | ORAL_TABLET | Freq: Once | ORAL | Status: AC
  Administered 2015-05-30: 650 mg via ORAL
  Filled 2015-05-30: qty 2

## 2015-05-30 MED ORDER — DIPHENHYDRAMINE HCL 25 MG PO CAPS
50.0000 mg | ORAL_CAPSULE | Freq: Once | ORAL | Status: AC
  Administered 2015-05-30: 50 mg via ORAL
  Filled 2015-05-30: qty 2

## 2015-05-30 MED ORDER — PEGFILGRASTIM 6 MG/0.6ML ~~LOC~~ PSKT
6.0000 mg | PREFILLED_SYRINGE | Freq: Once | SUBCUTANEOUS | Status: AC
Start: 2015-05-30 — End: 2015-05-30
  Administered 2015-05-30: 6 mg via SUBCUTANEOUS
  Filled 2015-05-30: qty 0.6

## 2015-05-30 MED ORDER — SODIUM CHLORIDE 0.9 % IV SOLN
Freq: Once | INTRAVENOUS | Status: AC
  Administered 2015-05-30: 10:00:00 via INTRAVENOUS
  Filled 2015-05-30: qty 8

## 2015-05-30 MED ORDER — SODIUM CHLORIDE 0.9 % IV SOLN
400.0000 mg/m2 | Freq: Once | INTRAVENOUS | Status: AC
  Administered 2015-05-30: 840 mg via INTRAVENOUS
  Filled 2015-05-30: qty 42

## 2015-05-30 MED ORDER — SODIUM CHLORIDE 0.9 % IV SOLN
375.0000 mg/m2 | Freq: Once | INTRAVENOUS | Status: AC
  Administered 2015-05-30: 800 mg via INTRAVENOUS
  Filled 2015-05-30: qty 80

## 2015-05-30 MED ORDER — HEPARIN SOD (PORK) LOCK FLUSH 100 UNIT/ML IV SOLN
INTRAVENOUS | Status: AC
  Filled 2015-05-30: qty 5

## 2015-05-30 MED ORDER — SODIUM CHLORIDE 0.9 % IV SOLN
6.0000 mg | Freq: Once | INTRAVENOUS | Status: AC
  Administered 2015-05-30: 6 mg via INTRAVENOUS
  Filled 2015-05-30: qty 4

## 2015-05-30 NOTE — Patient Instructions (Addendum)
[facility] Discharge Instructions for Patients Receiving Chemotherapy  Today you received the following chemotherapy agents Adriamycin, Vincristine, cytoxan and Rituxan. Neulasta OnPro body injector placed on upper arm. The device will deliver the medication between 615 pm and 715 pm tomorrow Saturday Dec 10th. You may safely remove injector after 730 pm or when indicator window display reads empty.  To help prevent nausea and vomiting after your treatment, we encourage you to take your nausea medication as instructed.  If you develop nausea and vomiting that is not controlled by your nausea medication, call the clinic. If it is after clinic hours your family physician or the after hours number for the clinic or go to the Emergency Department.  BELOW ARE SYMPTOMS THAT SHOULD BE REPORTED IMMEDIATELY:  *FEVER GREATER THAN 101.0 F  *CHILLS WITH OR WITHOUT FEVER  NAUSEA AND VOMITING THAT IS NOT CONTROLLED WITH YOUR NAUSEA MEDICATION  *UNUSUAL SHORTNESS OF BREATH  *UNUSUAL BRUISING OR BLEEDING  TENDERNESS IN MOUTH AND THROAT WITH OR WITHOUT PRESENCE OF ULCERS  *URINARY PROBLEMS  *BOWEL PROBLEMS  UNUSUAL RASH Items with * indicate a potential emergency and should be followed up as soon as possible.  Return as scheduled.  I have been informed and understand all the instructions given to me. I know to contact the clinic, my physician, or go to the Emergency Department if any problems should occur. I do not have any questions at this time, but understand that I may call the clinic during office hours or the Patient Navigator at [phone] should I have any questions or need assistance in obtaining follow up care.    __________________________________________  _____________  __________ Signature of Patient or Authorized Representative            Date                   Time    __________________________________________ Nurse's Signature

## 2015-05-30 NOTE — Progress Notes (Signed)
Tolerated chemo well. Ambulatory on discharge home with wife. Marland KitchenMarlou Starks arrived today for [facility] neulasta on body injector. See MAR for administration details. Injector in place and engaged with green light indicator on flashing. Tolerated application with out problems.

## 2015-05-30 NOTE — Progress Notes (Signed)
Chemo teaching completed and consent signed for RCHOP. Pt took this morning's dose of Prednisone. Pt has 2 chemo alert cards. Distress screening done.

## 2015-05-31 LAB — BETA 2 MICROGLOBULIN, SERUM: Beta-2 Microglobulin: 4.7 mg/L — ABNORMAL HIGH (ref 0.6–2.4)

## 2015-06-02 ENCOUNTER — Telehealth (HOSPITAL_COMMUNITY): Payer: Self-pay | Admitting: *Deleted

## 2015-06-02 NOTE — Progress Notes (Signed)
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 male is here for follow-up of his NHL. Restaging studies on 11/15 showed significant disease progression within the abdomen and pelvis.  He underwent a biopsy on 05/19/2015 that unfortunately shows a DLBCL. CD 20 + with Ki-67 at 70%.  He has completed cycle #1 of RCHOP. Vincristine was capped at 15m given pre-existing neuropathy. He notes he feels absolutely fine today. His highest blood sugar on prednisone was close to 300. It was 157 this am. He denies nausea, vomiting, fatigue, fever or chills. He is here today with his wife and grandchildren.   MEDICAL HISTORY:  Past Medical History  Diagnosis Date  . Hypertension   . Diabetes mellitus without complication ([place]   . Chronic kidney disease   . NHL (non-Hodgkin's lymphoma) ([facility]   . Lymphoma ([facility]     B cell lymphoma  . 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    SURGICAL HISTORY: Past Surgical History  Procedure Laterality Date  . Replacement total knee      right  . Appendectomy      SOCIAL HISTORY: Social History   Social History  . Marital  Status: Married    Spouse Name: N/A  . Number of Children: N/A  . Years of Education: N/A   Occupational History  . Not on file.   Social History Main Topics  . Smoking status: Former Smoker    Quit date: 09/07/2001  . Smokeless tobacco: Not on file  . Alcohol Use: No  . Drug Use: No  . Sexual Activity: Not on file   Other Topics Concern  . Not on file   Social History Narrative    FAMILY HISTORY: Family History  Problem Relation Age of Onset  . Diabetes Mother   . Diabetes Father   . Cancer Brother   . Diabetes Brother    indicated that his mother is deceased. He indicated that his father is deceased.   ALLERGIES:  has No Known Allergies.  MEDICATIONS:  Current Outpatient Prescriptions  Medication Sig Dispense Refill  . albuterol (PROVENTIL HFA;VENTOLIN HFA) 108 (90 BASE) MCG/ACT inhaler Inhale 2 puffs into the lungs every 6 (six) hours as needed for wheezing or shortness of breath.    . allopurinol (ZYLOPRIM) 300 MG tablet Take 150 mg by mouth daily.    .Marland KitchenamLODipine (NORVASC) 5 MG tablet Take 1 tablet (5 mg total) by mouth daily. 30 tablet 4  . budesonide-formoterol (SYMBICORT) 160-4.5 MCG/ACT inhaler Inhale 2 puffs into the lungs 2 (two) times daily.     . cholecalciferol (VITAMIN D) 1000 UNITS tablet Take 1,000 Units by mouth daily.    . Cyclophosphamide (CYTOXAN IJ) Inject as directed every 21 ( twenty-one) days. To begin 05/30/15    .  DOXOrubicin HCl (ADRIAMYCIN IV) Inject into the vein every 21 ( twenty-one) days. To begin 05/30/15    . insulin glargine (LANTUS) 100 UNIT/ML injection Inject 0.35 mLs (35 Units total) into the skin at bedtime.    . insulin lispro (HUMALOG) 100 UNIT/ML injection Inject into the skin. Check blood sugar three times a day and administer Humalog insulin as ordered: 151-200 = 2 units,   201-250 = 4 units,      251-300 = 6 units,   Greater than or equal to 301 = 8 units and call MD    . lidocaine-prilocaine (EMLA) cream Apply a quarter size  amount to port site 1 hour prior to chemo. Do not rub in. Cover with plastic wrap. 30 g 3  . OXYGEN Inhale 3 L into the lungs continuous.    . Pegfilgrastim (NEULASTA ONPRO [place]) Inject into the skin every 21 ( twenty-one) days. To be applied after the completion of chemo    . pravastatin (PRAVACHOL) 40 MG tablet Take 40 mg by mouth every evening.    . predniSONE (DELTASONE) 20 MG tablet On Days 1-5 of chemo take 3 tabs (68m total). 45 tablet 1  . RiTUXimab (RITUXAN IV) Inject into the vein every 21 ( twenty-one) days. To begin 05/30/15    . tiotropium (SPIRIVA) 18 MCG inhalation capsule Place 18 mcg into inhaler and inhale daily. 2 puffs daily    . VINCRISTINE SULFATE IV Inject into the vein every 21 ( twenty-one) days. To begin 05/30/15    . zolpidem (AMBIEN) 10 MG tablet Take 10 mg by mouth at bedtime as needed for sleep.     .Marland Kitchenacetaminophen (TYLENOL) 500 MG tablet Take 500 mg by mouth every 6 (six) hours as needed for moderate pain.    . citalopram (CELEXA) 40 MG tablet Take 40 mg by mouth daily.    .Marland KitchenHYDROcodone-acetaminophen (NORCO) 10-325 MG tablet May take 1/2 tab or 1 tab every 6 hours prn pain. (Patient not taking: Reported on 06/03/2015) 60 tablet 0  . ondansetron (ZOFRAN) 8 MG tablet Take 1 tablet (8 mg total) by mouth every 8 (eight) hours as needed for nausea or vomiting. (Patient not taking: Reported on 05/27/2015) 30 tablet 2  . prochlorperazine (COMPAZINE) 10 MG tablet Take 1 tablet (10 mg total) by mouth every 6 (six) hours as needed for nausea or vomiting. (Patient not taking: Reported on 05/27/2015) 30 tablet 2  . tamsulosin (FLOMAX) 0.4 MG CAPS capsule Take 0.4 mg by mouth at bedtime.    . traMADol (ULTRAM) 50 MG tablet Take 1 tablet (50 mg total) by mouth every 6 (six) hours as needed. (Patient not taking: Reported on 06/03/2015) 60 tablet 1   No current facility-administered medications for this visit.    Review of Systems  All other systems reviewed and are negative. 14 point  ROS was done and is otherwise as detailed above or in HPI  PHYSICAL EXAMINATION: ECOG PERFORMANCE STATUS: 1 - Symptomatic but completely ambulatory  Filed Vitals:   06/03/15 0815  BP: 126/53  Pulse: 87  Temp: 98.2 F (36.8 C)  Resp: 18   Filed Weights   06/03/15 0815  Weight: 215 lb 3.2 oz (97.614 kg)    Physical Exam  Constitutional: He is oriented to person, place, and time and well-developed, well-nourished, and in no distress. Onto exam table without difficulty. Talkative Wearing O2  HENT:  Head: Normocephalic and atraumatic.  Nose: Nose normal.  Mouth/Throat: Oropharynx is clear and moist. No  oropharyngeal exudate.  Eyes: Conjunctivae and EOM are normal. Pupils are equal, round, and reactive to light. Right eye exhibits no discharge. Left eye exhibits no discharge. No scleral icterus.  Neck: Normal range of motion. Neck supple. No tracheal deviation present. No thyromegaly present.  Cardiovascular: Normal rate, regular rhythm and normal heart sounds.  Exam reveals no gallop and no friction rub.   No murmur heard. Pulmonary/Chest: Effort normal and breath sounds normal. He has no wheezes. He has no rales.  Abdominal: Soft. Bowel sounds are normal. He exhibits no distension and no mass. There is no tenderness. There is no rebound and no guarding.  obese  Musculoskeletal: Normal range of motion. He exhibits no edema.  Lymphadenopathy:    He has no cervical adenopathy.  Neurological: He is alert and oriented to person, place, and time. He has normal reflexes. No cranial nerve deficit. Gait normal. Coordination normal.  Skin: Skin is warm and dry. No rash noted.  Psychiatric: Mood, memory, affect and judgment normal.  Nursing note and vitals reviewed.    LABORATORY DATA:  I have reviewed the data as listed Lab Results  Component Value Date   WBC 7.5 05/30/2015   HGB 11.5* 05/30/2015   HCT 33.6* 05/30/2015   MCV 81.2 05/30/2015   PLT 175 05/30/2015   CMP       Component Value Date/Time   NA 136 05/29/2015 0730   NA 140 01/02/2015 0959   K 4.0 05/29/2015 0730   K 4.3 01/02/2015 0959   CL 96* 05/29/2015 0730   CO2 30 05/29/2015 0730   CO2 38* 01/02/2015 0959   GLUCOSE 190* 05/29/2015 0730   GLUCOSE 219* 01/02/2015 0959   BUN 14 05/29/2015 0730   BUN 11.6 01/02/2015 0959   CREATININE 1.83* 05/29/2015 0730   CREATININE 2.5* 01/02/2015 0959   CALCIUM 9.3 05/29/2015 0730   CALCIUM 10.1 01/02/2015 0959   CALCIUM 15.0* 12/03/2014 2104   PROT 5.3* 04/01/2015 0835   PROT 6.0* 01/02/2015 0959   ALBUMIN 2.7* 04/01/2015 0835   ALBUMIN 2.9* 01/02/2015 0959   AST 21 04/01/2015 0835   AST 11 01/02/2015 0959   ALT 24 04/01/2015 0835   ALT 9 01/02/2015 0959   ALKPHOS 58 04/01/2015 0835   ALKPHOS 104 01/02/2015 0959   BILITOT 0.4 04/01/2015 0835   BILITOT 0.28 01/02/2015 0959   GFRNONAA 36* 05/29/2015 0730   GFRAA 42* 05/29/2015 0730   PATHOLOGY:      RADIOLOGY: I have personally reviewed the radiological images as listed and agreed with the findings in the report.  CLINICAL DATA: Diffuse large B-cell lymphoma  EXAM: CT GUIDED DEEP ILIAC BONE ASPIRATION AND CORE BIOPSY  TECHNIQUE: The procedure, risks (including but not limited to bleeding, infection, organ damage ), benefits, and alternatives were explained to the patient. Questions regarding the procedure were encouraged and answered. The patient understands and consents to the procedure. Patient was placed supine on the CT gantry and limited axial scans through the pelvis were obtained. Appropriate skin entry site was identified. Skin site was marked, prepped with Betadine, draped in usual sterile fashion, and infiltrated locally with 1% lidocaine.  Intravenous Fentanyl and Versed were administered as conscious sedation during continuous cardiorespiratory monitoring by the radiology RN, with a total moderate sedation time of less than 30 minutes.  Under CT fluoroscopic  guidance an 11-gauge Cook trocar bone needle was advanced into the right iliac bone just lateral to the sacroiliac joint. Once needle tip position was confirmed, coaxial  core and aspiration samples were obtained. The final sample was obtained using the guiding needle itself, which was then removed. Post procedure scans show no hematoma or fracture. Patient tolerated procedure well.  COMPLICATIONS: COMPLICATIONS none  IMPRESSION: 1. Technically successful CT guided right iliac bone core and aspiration biopsy.   Electronically Signed  By: Lucrezia Europe M.D.  On: 05/29/2015 14:24  CLINICAL DATA: Right retroperitoneal mass  EXAM: CT-GUIDED BIOPSY OF A RETROPERITONEAL MASS. CORE.  MEDICATIONS AND MEDICAL HISTORY: Versed 1 mg, Fentanyl 100 mcg.  Additional Medications: None.  ANESTHESIA/SEDATION: Moderate sedation time: 11 minutes  PROCEDURE: The procedure, risks, benefits, and alternatives were explained to the patient. Questions regarding the procedure were encouraged and answered. The patient understands and consents to the procedure.  The back was prepped with ChloraPrep in a sterile fashion, and a sterile drape was applied covering the operative field. A sterile gown and sterile gloves were used for the procedure.  Under CT guidance, a(n) 17 gauge guide needle was advanced into the right retroperitoneal lesion. Subsequently four 18 gauge core biopsies were obtained and placed in saline. The guide needle was removed. Final imaging was performed.  Patient tolerated the procedure well without complication. Vital sign monitoring by nursing staff during the procedure will continue as patient is in the special procedures unit for post procedure observation.  FINDINGS: The images document guide needle placement within the right retroperitoneal mass. Post biopsy images demonstrate no hemorrhage.  COMPLICATIONS: None  IMPRESSION: Successful CT-guided  core biopsy of a right retroperitoneal mass.   Electronically Signed  By: Marybelle Killings M.D.  On: 05/19/2015 10:50   ASSESSMENT & PLAN:  Stage IIX Non-Hodgkin's lymphoma, low-grade B cell lymphoma, CD20 and CD10 positive, on a core biopsy of a retroperitoneal mass 12/05/2014 Hypercalcemia of malignancy-resolved, calcium of >15 mg/dl on 12/03/2014 ARF secondary to above Vitamin D 103 pg/ml on 12/04/2014 Pamidronate on 12/20/2014 Hyperglycemia secondary to prednisone/ known history diabetes Klebsiella Bacteremia CT imaging on 05/06/2015 with progressive disease BIOPSY on 05/19/2015 with DLBCL DLBCL Stage II EF 60-65%, no regional wall motion abnormalities, grade 1 diastolic dysfunction RCHOP BMBX negative  He has done well so far without complaints.  He and his wife were notifed again to call us with any problems or concerns.  I again reviewed the importance of coming to the ED with fever or chills. The plan will be for f/u in 2 weeks prior to cycle #2.   We will obtain an ECHO after 2 cycles. We will obtain repeat imaging after 3. His pain is resolved. I am hopeful he will tolerate therapy well and we can achieve a remission.  All questions were answered. The patient knows to call the clinic with any problems, questions or concerns.   This note was electronically signed.    Kelby Fam. Whitney Muse, MD

## 2015-06-02 NOTE — Telephone Encounter (Signed)
24h follow up: Pain in lower abdomen/back is gone. Patient is happy about that. No side effects/complaints verbalized. No discolored urine. Blood sugars at highest were 300+. This morning blood sugar was in 190s. Patient doing good post chemo. Patient has 1 more day of taking Prednisone. Patient due to see Dr. Whitney Muse tomorrow morning @ 8:10.

## 2015-06-03 ENCOUNTER — Encounter: Payer: Self-pay | Admitting: *Deleted

## 2015-06-03 ENCOUNTER — Encounter (HOSPITAL_BASED_OUTPATIENT_CLINIC_OR_DEPARTMENT_OTHER): Payer: Medicare Other | Admitting: Hematology & Oncology

## 2015-06-03 ENCOUNTER — Inpatient Hospital Stay (HOSPITAL_COMMUNITY): Payer: Medicare Other

## 2015-06-03 ENCOUNTER — Encounter (HOSPITAL_COMMUNITY): Payer: Self-pay | Admitting: Hematology & Oncology

## 2015-06-03 VITALS — BP 126/53 | HR 87 | Temp 98.2°F | Resp 18 | Wt 215.2 lb

## 2015-06-03 DIAGNOSIS — C8330 Diffuse large B-cell lymphoma, unspecified site: Secondary | ICD-10-CM

## 2015-06-03 NOTE — Patient Instructions (Signed)
[place] at [facility] Discharge Instructions  RECOMMENDATIONS MADE BY THE CONSULTANT AND ANY TEST RESULTS WILL BE SENT TO YOUR REFERRING PHYSICIAN.  Exam completed by Dr Whitney Muse today Please call us if you have any problems Follow up as scheduled Chemotherapy in [redacted] weeks along with a doctors appt  Thank you for choosing [place] at [facility] to provide your oncology and hematology care.  To afford each patient quality time with our provider, please arrive at least 15 minutes before your scheduled appointment time.    You need to re-schedule your appointment should you arrive 10 or more minutes late.  We strive to give you quality time with our providers, and arriving late affects you and other patients whose appointments are after yours.  Also, if you no show three or more times for appointments you may be dismissed from the clinic at the providers discretion.     Again, thank you for choosing [facility].  Our hope is that these requests will decrease the amount of time that you wait before being seen by our physicians.       _____________________________________________________________  Should you have questions after your visit to [facility], please contact our office at [phone] between the hours of 8:30 a.m. and 4:30 p.m.  Voicemails left after 4:30 p.m. will not be returned until the following business day.  For prescription refill requests, have your pharmacy contact our office.

## 2015-06-03 NOTE — Progress Notes (Signed)
[facility] Psychosocial Distress Screening Clinical Social Work  Clinical Social Work was referred by distress screening protocol.  The patient scored a 5 on the Psychosocial Distress Thermometer which indicates moderate distress. Clinical Social Worker phoned pt and reviewed chart to assess for distress and other psychosocial needs. Pt denies current depression. He reports to feel good currently and states he "only took one of those pills". He shared it made him feel worse, thus he decided to stop. He reports he has no emotional concerns and feels he is coping well currently. Pt was educated about CSW services and availability for additional support. He agrees to reach out as needed and appreciated contact today.  ONCBCN DISTRESS SCREENING 05/30/2015  Screening Type Initial Screening  Distress experienced in past week (1-10) 5  Emotional problem type Depression  Physical Problem type Pain;Breathing    Clinical Social Worker follow up needed: No.  If yes, follow up plan: Billy Gray, [place] Tuesdays   [phone]

## 2015-06-06 LAB — CHROMOSOME ANALYSIS, BONE MARROW

## 2015-06-06 LAB — TISSUE HYBRIDIZATION (BONE MARROW)-NCBH

## 2015-06-13 ENCOUNTER — Other Ambulatory Visit (HOSPITAL_COMMUNITY): Payer: Self-pay | Admitting: Oncology

## 2015-06-13 DIAGNOSIS — I10 Essential (primary) hypertension: Secondary | ICD-10-CM

## 2015-06-13 MED ORDER — AMLODIPINE BESYLATE 5 MG PO TABS: 5.0000 mg | ORAL_TABLET | Freq: Every day | ORAL | Status: AC

## 2015-06-20 ENCOUNTER — Encounter (HOSPITAL_BASED_OUTPATIENT_CLINIC_OR_DEPARTMENT_OTHER): Payer: Medicare Other | Admitting: Hematology & Oncology

## 2015-06-20 ENCOUNTER — Encounter (HOSPITAL_COMMUNITY): Payer: Self-pay | Admitting: Hematology & Oncology

## 2015-06-20 ENCOUNTER — Encounter (HOSPITAL_BASED_OUTPATIENT_CLINIC_OR_DEPARTMENT_OTHER): Payer: Medicare Other

## 2015-06-20 VITALS — Wt 205.9 lb

## 2015-06-20 VITALS — BP 138/82 | HR 66 | Temp 97.6°F | Resp 20

## 2015-06-20 DIAGNOSIS — J449 Chronic obstructive pulmonary disease, unspecified: Secondary | ICD-10-CM

## 2015-06-20 DIAGNOSIS — E1122 Type 2 diabetes mellitus with diabetic chronic kidney disease: Secondary | ICD-10-CM

## 2015-06-20 DIAGNOSIS — Z5112 Encounter for antineoplastic immunotherapy: Secondary | ICD-10-CM

## 2015-06-20 DIAGNOSIS — C8330 Diffuse large B-cell lymphoma, unspecified site: Secondary | ICD-10-CM

## 2015-06-20 DIAGNOSIS — Z5111 Encounter for antineoplastic chemotherapy: Secondary | ICD-10-CM | POA: Diagnosis not present

## 2015-06-20 DIAGNOSIS — G629 Polyneuropathy, unspecified: Secondary | ICD-10-CM | POA: Diagnosis not present

## 2015-06-20 DIAGNOSIS — C8590 Non-Hodgkin lymphoma, unspecified, unspecified site: Secondary | ICD-10-CM | POA: Diagnosis not present

## 2015-06-20 DIAGNOSIS — E1140 Type 2 diabetes mellitus with diabetic neuropathy, unspecified: Secondary | ICD-10-CM

## 2015-06-20 DIAGNOSIS — N183 Chronic kidney disease, stage 3 (moderate): Secondary | ICD-10-CM

## 2015-06-20 LAB — CBC WITH DIFFERENTIAL/PLATELET
BASOS ABS: 0.1 10*3/uL (ref 0.0–0.1)
Basophils Relative: 2 %
EOS PCT: 1 %
Eosinophils Absolute: 0.1 10*3/uL (ref 0.0–0.7)
HEMATOCRIT: 31.9 % — AB (ref 39.0–52.0)
Hemoglobin: 10.8 g/dL — ABNORMAL LOW (ref 13.0–17.0)
LYMPHS ABS: 0.9 10*3/uL (ref 0.7–4.0)
LYMPHS PCT: 18 %
MCH: 27.4 pg (ref 26.0–34.0)
MCHC: 33.9 g/dL (ref 30.0–36.0)
MCV: 81 fL (ref 78.0–100.0)
MONO ABS: 0.7 10*3/uL (ref 0.1–1.0)
Monocytes Relative: 13 %
NEUTROS ABS: 3.5 10*3/uL (ref 1.7–7.7)
Neutrophils Relative %: 66 %
Platelets: 267 10*3/uL (ref 150–400)
RBC: 3.94 MIL/uL — ABNORMAL LOW (ref 4.22–5.81)
RDW: 13.5 % (ref 11.5–15.5)
WBC: 5.2 10*3/uL (ref 4.0–10.5)

## 2015-06-20 LAB — COMPREHENSIVE METABOLIC PANEL
ALT: 12 U/L — AB (ref 17–63)
AST: 17 U/L (ref 15–41)
Albumin: 2.3 g/dL — ABNORMAL LOW (ref 3.5–5.0)
Alkaline Phosphatase: 59 U/L (ref 38–126)
Anion gap: 6 (ref 5–15)
BILIRUBIN TOTAL: 0.3 mg/dL (ref 0.3–1.2)
BUN: 12 mg/dL (ref 6–20)
CO2: 30 mmol/L (ref 22–32)
CREATININE: 1.62 mg/dL — AB (ref 0.61–1.24)
Calcium: 8.6 mg/dL — ABNORMAL LOW (ref 8.9–10.3)
Chloride: 103 mmol/L (ref 101–111)
GFR, EST AFRICAN AMERICAN: 49 mL/min — AB (ref >60)
GFR, EST NON AFRICAN AMERICAN: 42 mL/min — AB (ref >60)
Glucose, Bld: 113 mg/dL — ABNORMAL HIGH (ref 65–99)
POTASSIUM: 3.9 mmol/L (ref 3.5–5.1)
Sodium: 139 mmol/L (ref 135–145)
TOTAL PROTEIN: 4.8 g/dL — AB (ref 6.5–8.1)

## 2015-06-20 LAB — LACTATE DEHYDROGENASE: LDH: 232 U/L — AB (ref 98–192)

## 2015-06-20 MED ORDER — ACETAMINOPHEN 325 MG PO TABS
ORAL_TABLET | ORAL | Status: AC
  Filled 2015-06-20: qty 2

## 2015-06-20 MED ORDER — DOXORUBICIN HCL CHEMO IV INJECTION 2 MG/ML
50.0000 mg/m2 | Freq: Once | INTRAVENOUS | Status: AC
  Administered 2015-06-20: 106 mg via INTRAVENOUS
  Filled 2015-06-20: qty 53

## 2015-06-20 MED ORDER — SODIUM CHLORIDE 0.9 % IV SOLN
Freq: Once | INTRAVENOUS | Status: AC
  Administered 2015-06-20: 10:00:00 via INTRAVENOUS

## 2015-06-20 MED ORDER — DIPHENHYDRAMINE HCL 25 MG PO CAPS
50.0000 mg | ORAL_CAPSULE | Freq: Once | ORAL | Status: AC
  Administered 2015-06-20: 50 mg via ORAL

## 2015-06-20 MED ORDER — SODIUM CHLORIDE 0.9 % IJ SOLN
10.0000 mL | INTRAMUSCULAR | Status: DC | PRN
  Administered 2015-06-20: 10 mL
  Filled 2015-06-20: qty 10

## 2015-06-20 MED ORDER — SODIUM CHLORIDE 0.9 % IV SOLN
Freq: Once | INTRAVENOUS | Status: AC
  Administered 2015-06-20: 11:00:00 via INTRAVENOUS
  Filled 2015-06-20: qty 8

## 2015-06-20 MED ORDER — SODIUM CHLORIDE 0.9 % IV SOLN
400.0000 mg/m2 | Freq: Once | INTRAVENOUS | Status: AC
  Administered 2015-06-20: 840 mg via INTRAVENOUS
  Filled 2015-06-20: qty 42

## 2015-06-20 MED ORDER — PEGFILGRASTIM 6 MG/0.6ML ~~LOC~~ PSKT
6.0000 mg | PREFILLED_SYRINGE | Freq: Once | SUBCUTANEOUS | Status: AC
  Administered 2015-06-20: 6 mg via SUBCUTANEOUS

## 2015-06-20 MED ORDER — DIPHENHYDRAMINE HCL 25 MG PO CAPS
ORAL_CAPSULE | ORAL | Status: AC
  Filled 2015-06-20: qty 2

## 2015-06-20 MED ORDER — SODIUM CHLORIDE 0.9 % IV SOLN
150.0000 mg | Freq: Once | INTRAVENOUS | Status: AC
  Administered 2015-06-20: 150 mg via INTRAVENOUS
  Filled 2015-06-20: qty 5

## 2015-06-20 MED ORDER — VINCRISTINE SULFATE CHEMO INJECTION 1 MG/ML
1.0000 mg | Freq: Once | INTRAVENOUS | Status: AC
  Administered 2015-06-20: 1 mg via INTRAVENOUS
  Filled 2015-06-20: qty 1

## 2015-06-20 MED ORDER — HEPARIN SOD (PORK) LOCK FLUSH 100 UNIT/ML IV SOLN
500.0000 [IU] | Freq: Once | INTRAVENOUS | Status: AC | PRN
  Administered 2015-06-20: 500 [IU]
  Filled 2015-06-20: qty 5

## 2015-06-20 MED ORDER — SODIUM CHLORIDE 0.9 % IV SOLN
375.0000 mg/m2 | Freq: Once | INTRAVENOUS | Status: AC
  Administered 2015-06-20: 800 mg via INTRAVENOUS
  Filled 2015-06-20: qty 80

## 2015-06-20 MED ORDER — ACETAMINOPHEN 325 MG PO TABS
650.0000 mg | ORAL_TABLET | Freq: Once | ORAL | Status: AC
  Administered 2015-06-20: 650 mg via ORAL

## 2015-06-20 MED ORDER — SODIUM CHLORIDE 0.9 % IV SOLN: Freq: Once | INTRAVENOUS | Status: DC

## 2015-06-20 NOTE — Progress Notes (Signed)
Tolerated chemo well. Marland KitchenMarlou Starks arrived today for [facility] neulasta on body injector. See MAR for administration details. Injector in place and engaged with green light indicator on flashing. Tolerated application with out problems. Patient stable on discharge home with family.

## 2015-06-20 NOTE — Patient Instructions (Addendum)
[place] at [facility] Discharge Instructions  RECOMMENDATIONS MADE BY THE CONSULTANT AND ANY TEST RESULTS WILL BE SENT TO YOUR REFERRING PHYSICIAN.   Exam completed by Dr Whitney Muse today Chemotherapy today Return for chemotherapy in 3 weeks If imodium doesn't help your diarrhea next time please call the clinic Call if you have nausea or vomiting that you cant stop If you get any mouth sores that dont heal please let us know Your bone marrow was normal Scans after the 3rd treatment Look at your heart before the 3rd treatment (07/08/2015) Return to see the doctor in 3 weeks  Please call the clinic if you have any questions or concerns      Thank you for choosing [place] at [facility] to provide your oncology and hematology care.  To afford each patient quality time with our provider, please arrive at least 15 minutes before your scheduled appointment time.    You need to re-schedule your appointment should you arrive 10 or more minutes late.  We strive to give you quality time with our providers, and arriving late affects you and other patients whose appointments are after yours.  Also, if you no show three or more times for appointments you may be dismissed from the clinic at the providers discretion.     Again, thank you for choosing [facility].  Our hope is that these requests will decrease the amount of time that you wait before being seen by our physicians.       _____________________________________________________________  Should you have questions after your visit to [facility], please contact our office at [phone] between the hours of 8:30 a.m. and 4:30 p.m.  Voicemails left after 4:30 p.m. will not be returned until the following business day.  For prescription refill requests, have your pharmacy contact our office.

## 2015-06-20 NOTE — Patient Instructions (Signed)
[facility] Discharge Instructions for Patients Receiving Chemotherapy  Today you received the following chemotherapy agents Doxorubicin,Vincristine,Cytoxan and Rituxan. Neulasta OnBody injector placed on left upper arm. Device will dispense medication between 630 pm and 730 pm tomorrow Saturday Dec 31st. You may safely remove device after 730 pm tomorrow.  To help prevent nausea and vomiting after your treatment, we encourage you to take your nausea medication as instructed. If you develop nausea and vomiting that is not controlled by your nausea medication, call the clinic. If it is after clinic hours your family physician or the after hours number for the clinic or go to the Emergency Department. BELOW ARE SYMPTOMS THAT SHOULD BE REPORTED IMMEDIATELY:  *FEVER GREATER THAN 101.0 F  *CHILLS WITH OR WITHOUT FEVER  NAUSEA AND VOMITING THAT IS NOT CONTROLLED WITH YOUR NAUSEA MEDICATION  *UNUSUAL SHORTNESS OF BREATH  *UNUSUAL BRUISING OR BLEEDING  TENDERNESS IN MOUTH AND THROAT WITH OR WITHOUT PRESENCE OF ULCERS  *URINARY PROBLEMS  *BOWEL PROBLEMS  UNUSUAL RASH Items with * indicate a potential emergency and should be followed up as soon as possible.  Return as scheduled.  I have been informed and understand all the instructions given to me. I know to contact the clinic, my physician, or go to the Emergency Department if any problems should occur. I do not have any questions at this time, but understand that I may call the clinic during office hours or the Patient Navigator at [phone] should I have any questions or need assistance in obtaining follow up care.    __________________________________________  _____________  __________ Signature of Patient or Authorized Representative            Date                   Time    __________________________________________ Nurse's Signature

## 2015-06-20 NOTE — Progress Notes (Signed)
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 male is here for follow-up of his NHL. Restaging studies on 11/15 showed significant disease progression within the abdomen and pelvis.  He underwent a biopsy on 05/19/2015 that unfortunately shows a DLBCL. CD 20 + with Ki-67 at 70%.  Mr. Hintz is with his wife and here for cycle #2 of RCHOP.  His previous back and side pain have resolved. His appetite is good. Denies chest pain. He is not concerned about his hair loss. He notes his mood is all right. His wife reports that he is weak but continues to go to church. He does not go out any more than that. He often wears pajamas in the house and watches television most of the day. Denies nausea or vomiting. Denies mouth sores. His wife notes that he had a sore throat attributed to drinking sparkling water. Denies sore throat today. His wife reports that he has decreased his eating ever since stopping the prednisone.  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 This is chronic and he does not feel it is worse. He is unable to sleep at night, noting that his feet feel frozen and tingly. He  continues to take half a pill of Gabapentin at a time. He recently took two half pills about 3 hours apart last night. His wife believes this foot discomfort to be about the same. Denies falls or stumbling. He feels as though his balance is off due to the neuropathy but he has never fallen. He reports that the neuropathy is worse when he lays down.   Reports recent instance of diarrhea for one day alleviated by imodium, though when he stopped the imodium it did come back. Having diarrhea about two or three times that one day. He experienced some diarrhea last night which resolved without imodium. Denies any diarrhea today. The last time he had to take imodium was this past Sunday, December 25th. The most imodium he has had to take was for a duration of two days.  The patient recently visited the [place] this past Tuesday, December 27th for primary care. He has an echocardiogram scheduled for 07/08/15. He will receive his next chemotherapy cycle on 07/11/15.   MEDICAL HISTORY:  Past Medical History  Diagnosis Date  . Hypertension   . Diabetes mellitus without complication ([place])   . Chronic kidney disease   . NHL (non-Hodgkin's lymphoma) ([place])   . Lymphoma ([place])     B cell lymphoma  . 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    SURGICAL HISTORY: Past Surgical History  Procedure Laterality Date  . Replacement total knee      right  . Appendectomy      SOCIAL HISTORY: Social History   Social History  .  Marital Status: Married    Spouse Name: N/A  . Number of Children: N/A  . Years of Education: N/A   Occupational History  . Not on file.   Social History Main Topics  . Smoking status: Former Smoker    Quit date: 09/07/2001  . Smokeless tobacco: Not on file  . Alcohol Use: No  . Drug Use: No  . Sexual Activity: Not on file   Other Topics Concern  . Not on file   Social History Narrative    FAMILY HISTORY: Family History  Problem Relation Age of  Onset  . Diabetes Mother   . Diabetes Father   . Cancer Brother   . Diabetes Brother    indicated that his mother is deceased. He indicated that his father is deceased.   ALLERGIES:  has No Known Allergies.  MEDICATIONS:  Current Outpatient Prescriptions  Medication Sig Dispense Refill  . acetaminophen (TYLENOL) 500 MG tablet Take 500 mg by mouth every 6 (six) hours as needed for moderate pain.    Marland Kitchen albuterol (PROVENTIL HFA;VENTOLIN HFA) 108 (90 BASE) MCG/ACT inhaler Inhale 2 puffs into the lungs every 6 (six) hours as needed for wheezing or shortness of breath.    . allopurinol (ZYLOPRIM) 300 MG tablet Take 150 mg by mouth daily.    Marland Kitchen amLODipine (NORVASC) 5 MG tablet Take 1 tablet (5 mg total) by mouth daily. 30 tablet 4  . budesonide-formoterol (SYMBICORT) 160-4.5 MCG/ACT inhaler Inhale 2 puffs into the lungs 2 (two) times daily.     . cholecalciferol (VITAMIN D) 1000 UNITS tablet Take 1,000 Units by mouth daily.    . citalopram (CELEXA) 40 MG tablet Take 40 mg by mouth daily.    . Cyclophosphamide (CYTOXAN IJ) Inject as directed every 21 ( twenty-one) days. To begin 05/30/15    . DOXOrubicin HCl (ADRIAMYCIN IV) Inject into the vein every 21 ( twenty-one) days. To begin 05/30/15    . GABAPENTIN, ONCE-DAILY, PO Take 800 mg by mouth at bedtime.    . insulin glargine (LANTUS) 100 UNIT/ML injection Inject 0.35 mLs (35 Units total) into the skin at bedtime.    . insulin lispro (HUMALOG) 100 UNIT/ML injection Inject into the skin. Check blood sugar three times a day and administer Humalog insulin as ordered: 151-200 = 2 units,   201-250 = 4 units,      251-300 = 6 units,   Greater than or equal to 301 = 8 units and call MD    . lidocaine-prilocaine (EMLA) cream Apply a quarter size amount to port site 1 hour prior to chemo. Do not rub in. Cover with plastic wrap. 30 g 3  . ondansetron (ZOFRAN) 8 MG tablet Take 1 tablet (8 mg total) by mouth every 8 (eight) hours as needed for nausea or vomiting.  30 tablet 2  . OXYGEN Inhale 3 L into the lungs continuous.    . Pegfilgrastim (NEULASTA ONPRO [place]) Inject into the skin every 21 ( twenty-one) days. To be applied after the completion of chemo    . pravastatin (PRAVACHOL) 40 MG tablet Take 40 mg by mouth every evening.    . predniSONE (DELTASONE) 20 MG tablet On Days 1-5 of chemo take 3 tabs (71m total). 45 tablet 1  . RiTUXimab (RITUXAN IV) Inject into the vein every 21 ( twenty-one) days. To begin 05/30/15    . tamsulosin (FLOMAX) 0.4 MG CAPS capsule Take 0.4 mg by mouth at bedtime.    .Marland Kitchentiotropium (SPIRIVA) 18 MCG  inhalation capsule Place 18 mcg into inhaler and inhale daily. 2 puffs daily    . VINCRISTINE SULFATE IV Inject into the vein every 21 ( twenty-one) days. To begin 05/30/15    . zolpidem (AMBIEN) 10 MG tablet Take 10 mg by mouth at bedtime as needed for sleep.     Marland Kitchen HYDROcodone-acetaminophen (NORCO) 10-325 MG tablet May take 1/2 tab or 1 tab every 6 hours prn pain. (Patient not taking: Reported on 06/03/2015) 60 tablet 0  . prochlorperazine (COMPAZINE) 10 MG tablet Take 1 tablet (10 mg total) by mouth every 6 (six) hours as needed for nausea or vomiting. (Patient not taking: Reported on 05/27/2015) 30 tablet 2  . traMADol (ULTRAM) 50 MG tablet Take 1 tablet (50 mg total) by mouth every 6 (six) hours as needed. (Patient not taking: Reported on 06/03/2015) 60 tablet 1   No current facility-administered medications for this visit.   Facility-Administered Medications Ordered in Other Visits  Medication Dose Route Frequency Provider Last Rate Last Dose  . 0.9 %  sodium chloride infusion   Intravenous Once Patrici Ranks, MD      . acetaminophen (TYLENOL) tablet 650 mg  650 mg Oral Once Patrici Ranks, MD      . cyclophosphamide (CYTOXAN) 840 mg in sodium chloride 0.9 % 250 mL chemo infusion  400 mg/m2 (Treatment Plan Actual) Intravenous Once Patrici Ranks, MD      . diphenhydrAMINE (BENADRYL) capsule 50 mg  50 mg Oral Once  Patrici Ranks, MD      . DOXOrubicin (ADRIAMYCIN) chemo injection 106 mg  50 mg/m2 (Treatment Plan Actual) Intravenous Once Patrici Ranks, MD      . fosaprepitant (EMEND) 150 mg in sodium chloride 0.9 % 145 mL IVPB  150 mg Intravenous Once Patrici Ranks, MD      . heparin lock flush 100 unit/mL  500 Units Intracatheter Once PRN Patrici Ranks, MD      . ondansetron (ZOFRAN) 16 mg, dexamethasone (DECADRON) 12 mg in sodium chloride 0.9 % 50 mL IVPB   Intravenous Once Patrici Ranks, MD      . pegfilgrastim (NEULASTA ONPRO KIT) injection 6 mg  6 mg Subcutaneous Once Patrici Ranks, MD      . riTUXimab (RITUXAN) 800 mg in sodium chloride 0.9 % 250 mL (2.4242 mg/mL) chemo infusion  375 mg/m2 (Treatment Plan Actual) Intravenous Once Patrici Ranks, MD      . sodium chloride 0.9 % injection 10 mL  10 mL Intracatheter PRN Patrici Ranks, MD      . vinCRIStine (ONCOVIN) 1 mg in sodium chloride 0.9 % 50 mL chemo infusion  1 mg Intravenous Once Patrici Ranks, MD        Review of Systems  Positive for malaise/fatigue. Positive for neuropathy.    Involves hands and feet, affects his ability to sleep. Feet feel "frozen and tingly". Positive for diarrhea.    Lasting one day with 2-3 bowel movements, managed with imodium. Last instance of diarrhea occurred last night, resolved on its own. 14 point ROS was done and is otherwise as detailed above or in HPI  PHYSICAL EXAMINATION: ECOG PERFORMANCE STATUS: 1 - Symptomatic but completely ambulatory  There were no vitals filed for this visit. Filed Weights   06/20/15 0840  Weight: 205 lb 14.4 oz (93.396 kg)    Physical Exam  Constitutional: He is oriented to person, place, and time and well-developed, well-nourished, and in no  distress. Talkative. Sitting in chemotherapy treatment bed. HENT:  Head: Normocephalic and atraumatic.  Nose: Nose normal.  Mouth/Throat: Oropharynx is clear and moist. No oropharyngeal exudate.  Eyes:  Conjunctivae and EOM are normal. Pupils are equal, round, and reactive to light. Right eye exhibits no discharge. Left eye exhibits no discharge. No scleral icterus.  Neck: Normal range of motion. Neck supple. No tracheal deviation present. No thyromegaly present.  Cardiovascular: Normal rate, regular rhythm and normal heart sounds.  Exam reveals no gallop and no friction rub.   No murmur heard. Pulmonary/Chest: Effort normal and breath sounds normal. He has no wheezes. He has no rales.  Abdominal: Soft. Bowel sounds are normal. He exhibits no distension and no mass. There is no tenderness. There is no rebound and no guarding.  Obese.   Musculoskeletal: Normal range of motion. He exhibits no edema.  Lymphadenopathy:    He has no cervical adenopathy.  Neurological: He is alert and oriented to person, place, and time. He has normal reflexes. No cranial nerve deficit. Gait normal. Coordination normal.  Skin: Skin is warm and dry. No rash noted.  Psychiatric: Mood, memory, affect and judgment normal.  Nursing note and vitals reviewed.    LABORATORY DATA:  I have reviewed the data as listed Lab Results  Component Value Date   WBC 5.2 06/20/2015   HGB 10.8* 06/20/2015   HCT 31.9* 06/20/2015   MCV 81.0 06/20/2015   PLT 267 06/20/2015   CMP     Component Value Date/Time   NA 139 06/20/2015 0850   NA 140 01/02/2015 0959   K 3.9 06/20/2015 0850   K 4.3 01/02/2015 0959   CL 103 06/20/2015 0850   CO2 30 06/20/2015 0850   CO2 38* 01/02/2015 0959   GLUCOSE 113* 06/20/2015 0850   GLUCOSE 219* 01/02/2015 0959   BUN 12 06/20/2015 0850   BUN 11.6 01/02/2015 0959   CREATININE 1.62* 06/20/2015 0850   CREATININE 2.5* 01/02/2015 0959   CALCIUM 8.6* 06/20/2015 0850   CALCIUM 10.1 01/02/2015 0959   CALCIUM 15.0* 12/03/2014 2104   PROT 4.8* 06/20/2015 0850   PROT 6.0* 01/02/2015 0959   ALBUMIN 2.3* 06/20/2015 0850   ALBUMIN 2.9* 01/02/2015 0959   AST 17 06/20/2015 0850   AST 11 01/02/2015  0959   ALT 12* 06/20/2015 0850   ALT 9 01/02/2015 0959   ALKPHOS 59 06/20/2015 0850   ALKPHOS 104 01/02/2015 0959   BILITOT 0.3 06/20/2015 0850   BILITOT 0.28 01/02/2015 0959   GFRNONAA 42* 06/20/2015 0850   GFRAA 49* 06/20/2015 0850   PATHOLOGY:      RADIOLOGY: I have personally reviewed the radiological images as listed and agreed with the findings in the report.  CLINICAL DATA: Diffuse large B-cell lymphoma  EXAM: CT GUIDED DEEP ILIAC BONE ASPIRATION AND CORE BIOPSY  TECHNIQUE: The procedure, risks (including but not limited to bleeding, infection, organ damage ), benefits, and alternatives were explained to the patient. Questions regarding the procedure were encouraged and answered. The patient understands and consents to the procedure. Patient was placed supine on the CT gantry and limited axial scans through the pelvis were obtained. Appropriate skin entry site was identified. Skin site was marked, prepped with Betadine, draped in usual sterile fashion, and infiltrated locally with 1% lidocaine.  Intravenous Fentanyl and Versed were administered as conscious sedation during continuous cardiorespiratory monitoring by the radiology RN, with a total moderate sedation time of less than 30 minutes.  Under CT fluoroscopic guidance an  11-gauge Cook trocar bone needle was advanced into the right iliac bone just lateral to the sacroiliac joint. Once needle tip position was confirmed, coaxial core and aspiration samples were obtained. The final sample was obtained using the guiding needle itself, which was then removed. Post procedure scans show no hematoma or fracture. Patient tolerated procedure well.  COMPLICATIONS: COMPLICATIONS none  IMPRESSION: 1. Technically successful CT guided right iliac bone core and aspiration biopsy.   Electronically Signed  By: Lucrezia Europe M.D.  On: 05/29/2015 14:24  CLINICAL DATA: Right retroperitoneal  mass  EXAM: CT-GUIDED BIOPSY OF A RETROPERITONEAL MASS. CORE.  MEDICATIONS AND MEDICAL HISTORY: Versed 1 mg, Fentanyl 100 mcg.  Additional Medications: None.  ANESTHESIA/SEDATION: Moderate sedation time: 11 minutes  PROCEDURE: The procedure, risks, benefits, and alternatives were explained to the patient. Questions regarding the procedure were encouraged and answered. The patient understands and consents to the procedure.  The back was prepped with ChloraPrep in a sterile fashion, and a sterile drape was applied covering the operative field. A sterile gown and sterile gloves were used for the procedure.  Under CT guidance, a(n) 17 gauge guide needle was advanced into the right retroperitoneal lesion. Subsequently four 18 gauge core biopsies were obtained and placed in saline. The guide needle was removed. Final imaging was performed.  Patient tolerated the procedure well without complication. Vital sign monitoring by nursing staff during the procedure will continue as patient is in the special procedures unit for post procedure observation.  FINDINGS: The images document guide needle placement within the right retroperitoneal mass. Post biopsy images demonstrate no hemorrhage.  COMPLICATIONS: None  IMPRESSION: Successful CT-guided core biopsy of a right retroperitoneal mass.   Electronically Signed  By: Marybelle Killings M.D.  On: 05/19/2015 10:50   ASSESSMENT & PLAN:  Stage IIX Non-Hodgkin's lymphoma, low-grade B cell lymphoma, CD20 and CD10 positive, on a core biopsy of a retroperitoneal mass 12/05/2014 Hypercalcemia of malignancy-resolved, calcium of >15 mg/dl on 12/03/2014 ARF secondary to above Vitamin D 103 pg/ml on 12/04/2014 Pamidronate on 12/20/2014 Hyperglycemia secondary to prednisone/ known history diabetes Klebsiella Bacteremia CT imaging on 05/06/2015 with progressive disease BIOPSY on 05/19/2015 with DLBCL DLBCL Stage II EF 60-65%, no  regional wall motion abnormalities, grade 1 diastolic dysfunction RCHOP BMBX negative   Mr. Somoza will receive cycle #2 of RCHOP today. He will undergo another ECHO before his cycle #3. After cycle #3 we will order re-imaging studies.   The patient understands to contact us if he begins to have diarrhea that is not managed with imodium, if he begins to experience uncontrolled nausea and vomiting, or if his neuropathy worsens. Advised the patient that he can increase his gabapentin intake to help alleviate current neuropathy symptoms.  He has an echocardiogram scheduled for 07/08/15. He will receive his next chemotherapy cycle on 07/11/15, we will seem him again when he returns for his cycle #3 of RCHOP.  All questions were answered. The patient knows to call the clinic with any problems, questions or concerns.   This document serves as a record of services personally performed by Ancil Linsey, MD. It was created on her behalf by Arlyce Harman, a trained medical scribe. The creation of this record is based on the scribe's personal observations and the provider's statements to them. This document has been checked and approved by the attending provider.  I have reviewed the above documentation for accuracy and completeness, and I agree with the above.  This note was electronically signed.  Kelby Fam. Whitney Muse, MD

## 2015-06-26 ENCOUNTER — Encounter (HOSPITAL_COMMUNITY): Payer: Self-pay

## 2015-06-28 ENCOUNTER — Inpatient Hospital Stay (HOSPITAL_COMMUNITY)
Admission: EM | Admit: 2015-06-28 | Discharge: 2015-07-01 | DRG: 808 | Disposition: A | Discharge status: Home / Self Care | Payer: Medicare Other | Attending: Internal Medicine | Admitting: Internal Medicine

## 2015-06-28 ENCOUNTER — Encounter (HOSPITAL_COMMUNITY): Payer: Self-pay

## 2015-06-28 ENCOUNTER — Emergency Department (HOSPITAL_COMMUNITY): Payer: Medicare Other

## 2015-06-28 DIAGNOSIS — J449 Chronic obstructive pulmonary disease, unspecified: Secondary | ICD-10-CM | POA: Diagnosis present

## 2015-06-28 DIAGNOSIS — J9611 Chronic respiratory failure with hypoxia: Secondary | ICD-10-CM | POA: Diagnosis present

## 2015-06-28 DIAGNOSIS — D61818 Other pancytopenia: Secondary | ICD-10-CM

## 2015-06-28 DIAGNOSIS — Z833 Family history of diabetes mellitus: Secondary | ICD-10-CM | POA: Diagnosis not present

## 2015-06-28 DIAGNOSIS — Z7952 Long term (current) use of systemic steroids: Secondary | ICD-10-CM

## 2015-06-28 DIAGNOSIS — I959 Hypotension, unspecified: Secondary | ICD-10-CM | POA: Diagnosis not present

## 2015-06-28 DIAGNOSIS — Z6831 Body mass index (BMI) 31.0-31.9, adult: Secondary | ICD-10-CM | POA: Diagnosis not present

## 2015-06-28 DIAGNOSIS — C8330 Diffuse large B-cell lymphoma, unspecified site: Secondary | ICD-10-CM

## 2015-06-28 DIAGNOSIS — I129 Hypertensive chronic kidney disease with stage 1 through stage 4 chronic kidney disease, or unspecified chronic kidney disease: Secondary | ICD-10-CM | POA: Diagnosis present

## 2015-06-28 DIAGNOSIS — D72819 Decreased white blood cell count, unspecified: Secondary | ICD-10-CM

## 2015-06-28 DIAGNOSIS — R05 Cough: Secondary | ICD-10-CM

## 2015-06-28 DIAGNOSIS — T451X5A Adverse effect of antineoplastic and immunosuppressive drugs, initial encounter: Secondary | ICD-10-CM | POA: Diagnosis present

## 2015-06-28 DIAGNOSIS — E162 Hypoglycemia, unspecified: Secondary | ICD-10-CM

## 2015-06-28 DIAGNOSIS — I493 Ventricular premature depolarization: Secondary | ICD-10-CM | POA: Diagnosis present

## 2015-06-28 DIAGNOSIS — Z87891 Personal history of nicotine dependence: Secondary | ICD-10-CM

## 2015-06-28 DIAGNOSIS — E1121 Type 2 diabetes mellitus with diabetic nephropathy: Secondary | ICD-10-CM | POA: Diagnosis present

## 2015-06-28 DIAGNOSIS — N179 Acute kidney failure, unspecified: Secondary | ICD-10-CM | POA: Diagnosis present

## 2015-06-28 DIAGNOSIS — D61810 Antineoplastic chemotherapy induced pancytopenia: Secondary | ICD-10-CM | POA: Diagnosis present

## 2015-06-28 DIAGNOSIS — R7881 Bacteremia: Secondary | ICD-10-CM

## 2015-06-28 DIAGNOSIS — N183 Chronic kidney disease, stage 3 (moderate): Secondary | ICD-10-CM

## 2015-06-28 DIAGNOSIS — N184 Chronic kidney disease, stage 4 (severe): Secondary | ICD-10-CM | POA: Diagnosis present

## 2015-06-28 DIAGNOSIS — R6510 Systemic inflammatory response syndrome (SIRS) of non-infectious origin without acute organ dysfunction: Secondary | ICD-10-CM | POA: Diagnosis present

## 2015-06-28 DIAGNOSIS — Z794 Long term (current) use of insulin: Secondary | ICD-10-CM | POA: Diagnosis not present

## 2015-06-28 DIAGNOSIS — Z9221 Personal history of antineoplastic chemotherapy: Secondary | ICD-10-CM | POA: Diagnosis not present

## 2015-06-28 DIAGNOSIS — R059 Cough, unspecified: Secondary | ICD-10-CM

## 2015-06-28 DIAGNOSIS — E1122 Type 2 diabetes mellitus with diabetic chronic kidney disease: Secondary | ICD-10-CM | POA: Diagnosis present

## 2015-06-28 DIAGNOSIS — Z809 Family history of malignant neoplasm, unspecified: Secondary | ICD-10-CM

## 2015-06-28 DIAGNOSIS — D709 Neutropenia, unspecified: Secondary | ICD-10-CM | POA: Diagnosis present

## 2015-06-28 DIAGNOSIS — T383X5A Adverse effect of insulin and oral hypoglycemic [antidiabetic] drugs, initial encounter: Secondary | ICD-10-CM | POA: Diagnosis present

## 2015-06-28 DIAGNOSIS — E11649 Type 2 diabetes mellitus with hypoglycemia without coma: Secondary | ICD-10-CM | POA: Diagnosis present

## 2015-06-28 DIAGNOSIS — Z79891 Long term (current) use of opiate analgesic: Secondary | ICD-10-CM | POA: Diagnosis not present

## 2015-06-28 DIAGNOSIS — N189 Chronic kidney disease, unspecified: Secondary | ICD-10-CM

## 2015-06-28 DIAGNOSIS — E860 Dehydration: Secondary | ICD-10-CM | POA: Diagnosis present

## 2015-06-28 DIAGNOSIS — R5081 Fever presenting with conditions classified elsewhere: Secondary | ICD-10-CM | POA: Diagnosis present

## 2015-06-28 DIAGNOSIS — R509 Fever, unspecified: Secondary | ICD-10-CM | POA: Diagnosis present

## 2015-06-28 DIAGNOSIS — J189 Pneumonia, unspecified organism: Secondary | ICD-10-CM | POA: Diagnosis present

## 2015-06-28 DIAGNOSIS — E160 Drug-induced hypoglycemia without coma: Secondary | ICD-10-CM

## 2015-06-28 DIAGNOSIS — Z7951 Long term (current) use of inhaled steroids: Secondary | ICD-10-CM

## 2015-06-28 DIAGNOSIS — I10 Essential (primary) hypertension: Secondary | ICD-10-CM

## 2015-06-28 DIAGNOSIS — Z9981 Dependence on supplemental oxygen: Secondary | ICD-10-CM

## 2015-06-28 DIAGNOSIS — Z79899 Other long term (current) drug therapy: Secondary | ICD-10-CM | POA: Diagnosis not present

## 2015-06-28 DIAGNOSIS — R609 Edema, unspecified: Secondary | ICD-10-CM

## 2015-06-28 DIAGNOSIS — C8333 Diffuse large B-cell lymphoma, intra-abdominal lymph nodes: Secondary | ICD-10-CM | POA: Diagnosis not present

## 2015-06-28 DIAGNOSIS — R590 Localized enlarged lymph nodes: Secondary | ICD-10-CM

## 2015-06-28 DIAGNOSIS — J441 Chronic obstructive pulmonary disease with (acute) exacerbation: Secondary | ICD-10-CM

## 2015-06-28 LAB — CBC WITH DIFFERENTIAL/PLATELET
BASOS ABS: 0 10*3/uL (ref 0.0–0.1)
Basophils Relative: 0 %
EOS ABS: 0 10*3/uL (ref 0.0–0.7)
Eosinophils Relative: 2 %
HCT: 25.5 % — ABNORMAL LOW (ref 39.0–52.0)
Hemoglobin: 8.7 g/dL — ABNORMAL LOW (ref 13.0–17.0)
LYMPHS PCT: 60 %
Lymphs Abs: 1.3 10*3/uL (ref 0.7–4.0)
MCH: 27.5 pg (ref 26.0–34.0)
MCHC: 34.1 g/dL (ref 30.0–36.0)
MCV: 80.7 fL (ref 78.0–100.0)
MONO ABS: 0.3 10*3/uL (ref 0.1–1.0)
Monocytes Relative: 16 %
NEUTROS PCT: 22 %
Neutro Abs: 0.5 10*3/uL — ABNORMAL LOW (ref 1.7–7.7)
PLATELETS: 42 10*3/uL — AB (ref 150–400)
RBC: 3.16 MIL/uL — AB (ref 4.22–5.81)
RDW: 13.8 % (ref 11.5–15.5)
WBC: 2.1 10*3/uL — AB (ref 4.0–10.5)

## 2015-06-28 LAB — COMPREHENSIVE METABOLIC PANEL
ALT: 15 U/L — ABNORMAL LOW (ref 17–63)
AST: 13 U/L — ABNORMAL LOW (ref 15–41)
Albumin: 2.3 g/dL — ABNORMAL LOW (ref 3.5–5.0)
Alkaline Phosphatase: 70 U/L (ref 38–126)
Anion gap: 5 (ref 5–15)
BUN: 31 mg/dL — ABNORMAL HIGH (ref 6–20)
CHLORIDE: 97 mmol/L — AB (ref 101–111)
CO2: 31 mmol/L (ref 22–32)
Calcium: 8.5 mg/dL — ABNORMAL LOW (ref 8.9–10.3)
Creatinine, Ser: 1.9 mg/dL — ABNORMAL HIGH (ref 0.61–1.24)
GFR, EST AFRICAN AMERICAN: 40 mL/min — AB (ref >60)
GFR, EST NON AFRICAN AMERICAN: 35 mL/min — AB (ref >60)
Glucose, Bld: 47 mg/dL — ABNORMAL LOW (ref 65–99)
POTASSIUM: 4.5 mmol/L (ref 3.5–5.1)
SODIUM: 133 mmol/L — AB (ref 135–145)
Total Bilirubin: 0.8 mg/dL (ref 0.3–1.2)
Total Protein: 4.7 g/dL — ABNORMAL LOW (ref 6.5–8.1)

## 2015-06-28 LAB — URINE MICROSCOPIC-ADD ON

## 2015-06-28 LAB — GLUCOSE, CAPILLARY: Glucose-Capillary: 111 mg/dL — ABNORMAL HIGH (ref 65–99)

## 2015-06-28 LAB — CBG MONITORING, ED: GLUCOSE-CAPILLARY: 109 mg/dL — AB (ref 65–99)

## 2015-06-28 LAB — URINALYSIS, ROUTINE W REFLEX MICROSCOPIC
BILIRUBIN URINE: NEGATIVE
Glucose, UA: NEGATIVE mg/dL
KETONES UR: NEGATIVE mg/dL
Leukocytes, UA: NEGATIVE
Nitrite: NEGATIVE
PH: 6 (ref 5.0–8.0)
Protein, ur: 100 mg/dL — AB
SPECIFIC GRAVITY, URINE: 1.02 (ref 1.005–1.030)

## 2015-06-28 LAB — I-STAT CG4 LACTIC ACID, ED
LACTIC ACID, VENOUS: 0.82 mmol/L (ref 0.5–2.0)
LACTIC ACID, VENOUS: 0.89 mmol/L (ref 0.5–2.0)

## 2015-06-28 MED ORDER — SODIUM CHLORIDE 0.9 % IV BOLUS (SEPSIS)
1000.0000 mL | INTRAVENOUS | Status: AC
  Administered 2015-06-28 (×3): 1000 mL via INTRAVENOUS

## 2015-06-28 MED ORDER — DEXTROSE 5 % IV SOLN
INTRAVENOUS | Status: AC
  Filled 2015-06-28: qty 2

## 2015-06-28 MED ORDER — ACETAMINOPHEN 325 MG PO TABS: 650.0000 mg | ORAL_TABLET | Freq: Four times a day (QID) | ORAL | Status: DC | PRN

## 2015-06-28 MED ORDER — SODIUM CHLORIDE 0.9 % IV SOLN
8.0000 mg | Freq: Three times a day (TID) | INTRAVENOUS | Status: DC | PRN
  Filled 2015-06-28: qty 4

## 2015-06-28 MED ORDER — SODIUM CHLORIDE 0.9 % IV SOLN
INTRAVENOUS | Status: AC
Start: 2015-06-28 — End: 2015-06-29

## 2015-06-28 MED ORDER — VANCOMYCIN HCL IN DEXTROSE 750-5 MG/150ML-% IV SOLN
750.0000 mg | Freq: Two times a day (BID) | INTRAVENOUS | Status: DC
Start: 2015-06-29 — End: 2015-06-29
  Administered 2015-06-29: 750 mg via INTRAVENOUS
  Filled 2015-06-28 (×4): qty 150

## 2015-06-28 MED ORDER — ALBUTEROL SULFATE (2.5 MG/3ML) 0.083% IN NEBU
3.0000 mL | INHALATION_SOLUTION | Freq: Four times a day (QID) | RESPIRATORY_TRACT | Status: DC | PRN
  Administered 2015-06-28 – 2015-06-30 (×5): 3 mL via RESPIRATORY_TRACT
  Filled 2015-06-28 (×5): qty 3

## 2015-06-28 MED ORDER — ALLOPURINOL 300 MG PO TABS
150.0000 mg | ORAL_TABLET | Freq: Every day | ORAL | Status: DC
  Administered 2015-06-29 – 2015-07-01 (×3): 150 mg via ORAL
  Filled 2015-06-28 (×3): qty 1

## 2015-06-28 MED ORDER — SODIUM CHLORIDE 0.9 % IJ SOLN
3.0000 mL | Freq: Two times a day (BID) | INTRAMUSCULAR | Status: DC
  Administered 2015-06-28 – 2015-06-30 (×3): 3 mL via INTRAVENOUS

## 2015-06-28 MED ORDER — ALUM & MAG HYDROXIDE-SIMETH 200-200-20 MG/5ML PO SUSP
30.0000 mL | Freq: Four times a day (QID) | ORAL | Status: DC | PRN
  Administered 2015-06-28 – 2015-06-29 (×2): 30 mL via ORAL
  Filled 2015-06-28 (×2): qty 30

## 2015-06-28 MED ORDER — INSULIN ASPART 100 UNIT/ML ~~LOC~~ SOLN
0.0000 [IU] | Freq: Three times a day (TID) | SUBCUTANEOUS | Status: DC
  Administered 2015-06-29: 3 [IU] via SUBCUTANEOUS

## 2015-06-28 MED ORDER — GABAPENTIN 300 MG PO CAPS
ORAL_CAPSULE | ORAL | Status: AC
  Filled 2015-06-28: qty 1

## 2015-06-28 MED ORDER — VANCOMYCIN HCL 10 G IV SOLR
1750.0000 mg | Freq: Once | INTRAVENOUS | Status: AC
  Administered 2015-06-28: 1750 mg via INTRAVENOUS
  Filled 2015-06-28: qty 1750

## 2015-06-28 MED ORDER — GABAPENTIN 800 MG PO TABS
400.0000 mg | ORAL_TABLET | Freq: Two times a day (BID) | ORAL | Status: DC
  Administered 2015-06-28: 400 mg via ORAL
  Filled 2015-06-28 (×4): qty 0.5

## 2015-06-28 MED ORDER — TAMSULOSIN HCL 0.4 MG PO CAPS
0.4000 mg | ORAL_CAPSULE | Freq: Every day | ORAL | Status: DC
  Administered 2015-06-28 – 2015-06-30 (×3): 0.4 mg via ORAL
  Filled 2015-06-28 (×3): qty 1

## 2015-06-28 MED ORDER — TIOTROPIUM BROMIDE MONOHYDRATE 18 MCG IN CAPS
18.0000 ug | ORAL_CAPSULE | Freq: Every day | RESPIRATORY_TRACT | Status: DC
  Administered 2015-06-29 – 2015-07-01 (×3): 18 ug via RESPIRATORY_TRACT
  Filled 2015-06-28: qty 5

## 2015-06-28 MED ORDER — GUAIFENESIN ER 600 MG PO TB12
600.0000 mg | ORAL_TABLET | Freq: Two times a day (BID) | ORAL | Status: DC
  Administered 2015-06-28 – 2015-07-01 (×6): 600 mg via ORAL
  Filled 2015-06-28 (×6): qty 1

## 2015-06-28 MED ORDER — HYDROCODONE-ACETAMINOPHEN 10-325 MG PO TABS
0.5000 | ORAL_TABLET | Freq: Four times a day (QID) | ORAL | Status: DC | PRN
  Administered 2015-06-28 – 2015-06-29 (×2): 1 via ORAL
  Filled 2015-06-28 (×2): qty 1

## 2015-06-28 MED ORDER — TIOTROPIUM BROMIDE MONOHYDRATE 18 MCG IN CAPS
ORAL_CAPSULE | RESPIRATORY_TRACT | Status: AC
  Filled 2015-06-28: qty 5

## 2015-06-28 MED ORDER — PRAVASTATIN SODIUM 40 MG PO TABS
40.0000 mg | ORAL_TABLET | Freq: Every evening | ORAL | Status: DC
  Administered 2015-06-28 – 2015-06-30 (×3): 40 mg via ORAL
  Filled 2015-06-28 (×3): qty 1

## 2015-06-28 MED ORDER — GABAPENTIN 100 MG PO CAPS
ORAL_CAPSULE | ORAL | Status: AC
  Filled 2015-06-28: qty 1

## 2015-06-28 MED ORDER — SODIUM CHLORIDE 0.9 % IV SOLN
INTRAVENOUS | Status: DC
  Administered 2015-06-28 – 2015-06-29 (×2): via INTRAVENOUS

## 2015-06-28 MED ORDER — ACETAMINOPHEN 650 MG RE SUPP: 650.0000 mg | Freq: Four times a day (QID) | RECTAL | Status: DC | PRN

## 2015-06-28 MED ORDER — BUDESONIDE-FORMOTEROL FUMARATE 160-4.5 MCG/ACT IN AERO
INHALATION_SPRAY | RESPIRATORY_TRACT | Status: AC
  Filled 2015-06-28: qty 6

## 2015-06-28 MED ORDER — SODIUM CHLORIDE 0.9 % IV BOLUS (SEPSIS): 1000.0000 mL | Freq: Once | INTRAVENOUS | Status: DC

## 2015-06-28 MED ORDER — DEXTROSE 5 % IV SOLN
2.0000 g | Freq: Two times a day (BID) | INTRAVENOUS | Status: DC
  Administered 2015-06-28 – 2015-07-01 (×7): 2 g via INTRAVENOUS
  Filled 2015-06-28 (×9): qty 2

## 2015-06-28 MED ORDER — POLYETHYLENE GLYCOL 3350 17 G PO PACK: 17.0000 g | PACK | Freq: Every day | ORAL | Status: DC | PRN

## 2015-06-28 MED ORDER — ONDANSETRON HCL 4 MG PO TABS
8.0000 mg | ORAL_TABLET | Freq: Three times a day (TID) | ORAL | Status: DC | PRN
  Administered 2015-06-28: 8 mg via ORAL
  Filled 2015-06-28: qty 2

## 2015-06-28 MED ORDER — ACETAMINOPHEN 325 MG PO TABS
650.0000 mg | ORAL_TABLET | Freq: Once | ORAL | Status: AC
Start: 2015-06-28 — End: 2015-06-28
  Administered 2015-06-28: 650 mg via ORAL
  Filled 2015-06-28: qty 2

## 2015-06-28 MED ORDER — INSULIN GLARGINE 100 UNIT/ML ~~LOC~~ SOLN
35.0000 [IU] | Freq: Every day | SUBCUTANEOUS | Status: DC
  Administered 2015-06-28: 35 [IU] via SUBCUTANEOUS
  Filled 2015-06-28 (×3): qty 0.35

## 2015-06-28 MED ORDER — ZOLPIDEM TARTRATE 5 MG PO TABS
5.0000 mg | ORAL_TABLET | Freq: Every evening | ORAL | Status: DC | PRN
  Administered 2015-06-28 – 2015-06-29 (×2): 5 mg via ORAL
  Filled 2015-06-28 (×2): qty 1

## 2015-06-28 MED ORDER — BUDESONIDE-FORMOTEROL FUMARATE 160-4.5 MCG/ACT IN AERO
2.0000 | INHALATION_SPRAY | Freq: Two times a day (BID) | RESPIRATORY_TRACT | Status: DC
  Administered 2015-06-28 – 2015-07-01 (×6): 2 via RESPIRATORY_TRACT
  Filled 2015-06-28: qty 6

## 2015-06-28 NOTE — ED Notes (Signed)
Pt reports cough, congestion, and sob for the past 4 or 5 days.  Reports generalized body aches and pain in feet for past few days.  Pt took hydrocodone around 11am for foot pain.  Pt's last chemo treatment was 12/30.  Reports has been very thirsty and not had an appetite.  Pt c/o constipation.

## 2015-06-28 NOTE — Progress Notes (Signed)
Neb given early

## 2015-06-28 NOTE — Progress Notes (Signed)
ANTIBIOTIC CONSULT NOTE - INITIAL  Pharmacy Consult for vancomycin and cefepime Indication: pneumonia  No Known Allergies  Patient Measurements: Height: 5\' 8"  (172.7 cm) Weight: 209 lb (94.802 kg) IBW/kg (Calculated) : 68.4   Vital Signs: Temp: 100.2 F (37.9 C) (01/07 1408) Temp Source: Oral (01/07 1408) BP: 142/63 mmHg (01/07 1408) Pulse Rate: 102 (01/07 1408) Intake/Output from previous day:   Intake/Output from this shift:    Labs: No results for input(s): WBC, HGB, PLT, LABCREA, CREATININE in the last 72 hours. Estimated Creatinine Clearance: 48.8 mL/min (by C-G formula based on Cr of 1.62). No results for input(s): VANCOTROUGH, VANCOPEAK, VANCORANDOM, GENTTROUGH, GENTPEAK, GENTRANDOM, TOBRATROUGH, TOBRAPEAK, TOBRARND, AMIKACINPEAK, AMIKACINTROU, AMIKACIN in the last 72 hours.   Microbiology: No results found for this or any previous visit (from the past 720 hour(s)).  Medical History: Past Medical History  Diagnosis Date  . Hypertension   . Diabetes mellitus without complication ([place])   . Chronic kidney disease   . NHL (non-Hodgkin's lymphoma) ([place])   . Lymphoma ([place])     B cell lymphoma  . 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    Medications:  See medication history Assessment: 70 yo man to start broad spectrum antibiotics for PNA.    Goal of Therapy:  Vancomycin trough level 15-20 mcg/ml  Plan:  Vancomycin 1750 mg IV X 1 then 750 mg IV q12 hours Cefepime 2 gm IV q12 hours F/u renal function, cultures and clinical course  Thanks for allowing pharmacy to be a part of this patient's care.  Excell Seltzer, PharmD Clinical Pharmacist 06/28/2015,2:17 PM

## 2015-06-28 NOTE — ED Provider Notes (Signed)
CSN: [id number]     Arrival date & time 06/28/15  1354 History   First MD Initiated Contact with Patient 06/28/15 1402     Chief Complaint  Patient presents with  . Cough  . Generalized Body Aches     (Consider location/radiation/quality/duration/timing/severity/associated sxs/prior Treatment) HPI   Billy Gray is a 70 y.o. male presents for evaluation of "just not feeling right". He has had cough and congestion, weakness and achiness for several days. He completed his last chemotherapy for non-Hodgkin's lymphoma, one week ago. Had some diarrhea yesterday but took Imodium with relief. No documented fever at home. No chills, or rigors. No nausea or vomiting. He has been having a decreased appetite for several days. His non-Hodgkin's lymphoma has progressed and he is in cycle chemotherapy treatment currently. He is taking his usual medications. There are no other known finding factors.   Past Medical History  Diagnosis Date  . Hypertension   . Diabetes mellitus without complication ([place])   . Chronic kidney disease   . NHL (non-Hodgkin's lymphoma) ([place])   . Lymphoma ([place])     B cell lymphoma  . COPD (chronic obstructive pulmonary disease) ([place])     O2 dependent   Past Surgical History  Procedure Laterality Date  . Replacement total knee      right  . Appendectomy     Family History  Problem Relation Age of Onset  . Diabetes Mother   . Diabetes Father   . Cancer Brother   . Diabetes Brother    Social History  Substance Use Topics  . Smoking status: Former Smoker    Quit date: 09/07/2001  . Smokeless tobacco: None  . Alcohol Use: No    Review of Systems  All other systems reviewed and are negative.     Allergies  Review of patient's allergies indicates no known allergies.  Home Medications   Prior to Admission medications   Medication Sig Start Date End Date Taking? Authorizing Provider  acetaminophen (TYLENOL) 500 MG tablet Take 500 mg by mouth every 6 (six)  hours as needed for moderate pain.   Yes Historical Provider, MD  albuterol (PROVENTIL HFA;VENTOLIN HFA) 108 (90 BASE) MCG/ACT inhaler Inhale 2 puffs into the lungs every 6 (six) hours as needed for wheezing or shortness of breath.   Yes Historical Provider, MD  allopurinol (ZYLOPRIM) 300 MG tablet Take 150 mg by mouth daily.   Yes Historical Provider, MD  amLODipine (NORVASC) 5 MG tablet Take 1 tablet (5 mg total) by mouth daily. 06/13/15  Yes Manon Hilding Kefalas, PA-C  budesonide-formoterol (SYMBICORT) 160-4.5 MCG/ACT inhaler Inhale 2 puffs into the lungs 2 (two) times daily.    Yes Historical Provider, MD  cholecalciferol (VITAMIN D) 1000 UNITS tablet Take 1,000 Units by mouth daily.   Yes Historical Provider, MD  citalopram (CELEXA) 40 MG tablet Take 40 mg by mouth daily.   Yes Historical Provider, MD  Cyclophosphamide (CYTOXAN IJ) Inject as directed every 21 ( twenty-one) days. To begin 05/30/15   Yes Historical Provider, MD  DOXOrubicin HCl (ADRIAMYCIN IV) Inject into the vein every 21 ( twenty-one) days. To begin 05/30/15   Yes Historical Provider, MD  gabapentin (NEURONTIN) 800 MG tablet Take 400 mg by mouth 2 (two) times daily.   Yes Historical Provider, MD  HYDROcodone-acetaminophen [facility] 10-325 MG tablet May take 1/2 tab or 1 tab every 6 hours prn pain. Patient taking differently: Take 0.5-1 tablets by mouth every 6 (six) hours as needed for moderate  pain or severe pain.  05/23/15  Yes Manon Hilding Kefalas, PA-C  insulin glargine (LANTUS) 100 UNIT/ML injection Inject 0.35 mLs (35 Units total) into the skin at bedtime. 03/22/15  Yes Samuella Cota, MD  insulin lispro (HUMALOG) 100 UNIT/ML injection Inject into the skin. Check blood sugar three times a day and administer Humalog insulin as ordered: 151-200 = 2 units,   201-250 = 4 units,      251-300 = 6 units,   Greater than or equal to 301 = 8 units and call MD   Yes Historical Provider, MD  lidocaine-prilocaine (EMLA) cream Apply a quarter size  amount to port site 1 hour prior to chemo. Do not rub in. Cover with plastic wrap. 05/26/15  Yes Patrici Ranks, MD  ondansetron (ZOFRAN) 8 MG tablet Take 1 tablet (8 mg total) by mouth every 8 (eight) hours as needed for nausea or vomiting. 05/26/15  Yes Patrici Ranks, MD  OXYGEN Inhale 3 L into the lungs continuous.   Yes Historical Provider, MD  Pegfilgrastim (NEULASTA ONPRO [place]) Inject into the skin every 21 ( twenty-one) days. To be applied after the completion of chemo   Yes Historical Provider, MD  pravastatin (PRAVACHOL) 40 MG tablet Take 40 mg by mouth every evening.   Yes Historical Provider, MD  predniSONE (DELTASONE) 20 MG tablet On Days 1-5 of chemo take 3 tabs (60mg  total). Patient taking differently: Take 60 mg by mouth See admin instructions. On Days 1-5 of chemo take 3 tabs (60mg  total). 05/27/15  Yes Patrici Ranks, MD  RiTUXimab (RITUXAN IV) Inject into the vein every 21 ( twenty-one) days. To begin 05/30/15   Yes Historical Provider, MD  tamsulosin (FLOMAX) 0.4 MG CAPS capsule Take 0.4 mg by mouth at bedtime.   Yes Historical Provider, MD  tiotropium (SPIRIVA) 18 MCG inhalation capsule Place 18 mcg into inhaler and inhale daily. 2 puffs daily   Yes Historical Provider, MD  VINCRISTINE SULFATE IV Inject into the vein every 21 ( twenty-one) days. To begin 05/30/15   Yes Historical Provider, MD  zolpidem (AMBIEN) 10 MG tablet Take 10 mg by mouth at bedtime as needed for sleep.    Yes Historical Provider, MD  prochlorperazine (COMPAZINE) 10 MG tablet Take 1 tablet (10 mg total) by mouth every 6 (six) hours as needed for nausea or vomiting. Patient not taking: Reported on 05/27/2015 05/26/15   Patrici Ranks, MD  traMADol (ULTRAM) 50 MG tablet Take 1 tablet (50 mg total) by mouth every 6 (six) hours as needed. Patient not taking: Reported on 06/03/2015 05/22/15   Patrici Ranks, MD   BP 83/44 mmHg  Pulse 71  Temp(Src) 100.2 F (37.9 C) (Oral)  Resp 14  Ht 5\' 8"  (1.727 m)   Wt 209 lb (94.802 kg)  BMI 31.79 kg/m2  SpO2 96% Physical Exam  Constitutional: He is oriented to person, place, and time. He appears well-developed and well-nourished.  HENT:  Head: Normocephalic and atraumatic.  Right Ear: External ear normal.  Left Ear: External ear normal.  Eyes: Conjunctivae and EOM are normal. Pupils are equal, round, and reactive to light.  Neck: Normal range of motion and phonation normal. Neck supple.  Cardiovascular: Normal rate, regular rhythm and normal heart sounds.   Pulmonary/Chest: Effort normal and breath sounds normal. He exhibits no bony tenderness.  Port-A-Cath site right upper chest wall, appears normal. No associated swelling, drainage or erythema.  Abdominal: Soft. There is no tenderness.  Musculoskeletal:  Normal range of motion. He exhibits edema (2+ lower legs).  Neurological: He is alert and oriented to person, place, and time. No cranial nerve deficit or sensory deficit. He exhibits normal muscle tone. Coordination normal.  Skin: Skin is warm, dry and intact.  Psychiatric: He has a normal mood and affect. His behavior is normal. Judgment and thought content normal.  Nursing note and vitals reviewed.   ED Course  Procedures (including critical care time)  Medications  sodium chloride 0.9 % bolus 1,000 mL (1,000 mLs Intravenous New Bag/Given 06/28/15 1710)  vancomycin (VANCOCIN) 1,750 mg in sodium chloride 0.9 % 500 mL IVPB (1,750 mg Intravenous New Bag/Given 06/28/15 1541)  ceFEPIme (MAXIPIME) 2 g in dextrose 5 % 50 mL IVPB (0 g Intravenous Stopped 06/28/15 1533)  vancomycin (VANCOCIN) IVPB 750 mg/150 ml premix (not administered)  sodium chloride 0.9 % bolus 1,000 mL (not administered)  acetaminophen (TYLENOL) tablet 650 mg (650 mg Oral Given 06/28/15 1459)    Patient Vitals for the past 24 hrs:  BP Temp Temp src Pulse Resp SpO2 Height Weight  06/28/15 1700 (!) 83/44 mmHg - - 71 14 96 % - -  06/28/15 1630 121/81 mmHg - - 80 21 95 % - -   06/28/15 1600 (!) 111/43 mmHg - - 88 24 96 % - -  06/28/15 1530 101/57 mmHg - - 74 20 96 % - -  06/28/15 1500 134/59 mmHg - - 102 16 96 % - -  06/28/15 1408 142/63 mmHg 100.2 F (37.9 C) Oral 102 - 99 % 5\' 8"  (1.727 m) 209 lb (94.802 kg)    5:25 PM Reevaluation with update and discussion. After initial assessment and treatment, an updated evaluation reveals clinical examination essentially unchanged. Repeat blood pressure this time 95/53. CBG 109. Patient has been able to eat and drink here. Shirlene Andaya L   Sepsis - Repeat Assessment  Performed at:    17:33  Vitals     Blood pressure 105/47, pulse 77, temperature 100.2 F (37.9 C), temperature source Oral, resp. rate 18, height 5\' 8"  (1.727 m), weight 209 lb (94.802 kg), SpO2 100 %.  Heart:     Regular rate and rhythm  Lungs:    CTA  Capillary Refill:   <2 sec  Peripheral Pulse:   Radial pulse palpable  Skin:     Normal Color    5:42 PM-Consult complete with Hospitalist. Patient case explained and discussed. He agrees to admit patient for further evaluation and treatment. Call ended at [place] Performed by: Richarda Blade Total critical care time: 55 minutes Critical care time was exclusive of separately billable procedures and treating other patients. Critical care was necessary to treat or prevent imminent or life-threatening deterioration. Critical care was time spent personally by me on the following activities: development of treatment plan with patient and/or surrogate as well as nursing, discussions with consultants, evaluation of patient's response to treatment, examination of patient, obtaining history from patient or surrogate, ordering and performing treatments and interventions, ordering and review of laboratory studies, ordering and review of radiographic studies, pulse oximetry and re-evaluation of patient's condition.     Labs Review Labs Reviewed  COMPREHENSIVE METABOLIC PANEL - Abnormal;  Notable for the following:    Sodium 133 (*)    Chloride 97 (*)    Glucose, Bld 47 (*)    BUN 31 (*)    Creatinine, Ser 1.90 (*)    Calcium 8.5 (*)    Total Protein 4.7 (*)  Albumin 2.3 (*)    AST 13 (*)    ALT 15 (*)    GFR calc non Af Amer 35 (*)    GFR calc Af Amer 40 (*)    All other components within normal limits  CBC WITH DIFFERENTIAL/PLATELET - Abnormal; Notable for the following:    WBC 2.1 (*)    RBC 3.16 (*)    Hemoglobin 8.7 (*)    HCT 25.5 (*)    Platelets 42 (*)    Neutro Abs 0.5 (*)    All other components within normal limits  URINALYSIS, ROUTINE W REFLEX MICROSCOPIC (NOT AT [facility]) - Abnormal; Notable for the following:    Color, Urine AMBER (*)    Hgb urine dipstick TRACE (*)    Protein, ur 100 (*)    All other components within normal limits  URINE MICROSCOPIC-ADD ON - Abnormal; Notable for the following:    Squamous Epithelial / LPF 0-5 (*)    Bacteria, UA RARE (*)    All other components within normal limits  CULTURE, BLOOD (ROUTINE X 2)  CULTURE, BLOOD (ROUTINE X 2)  URINE CULTURE  I-STAT CG4 LACTIC ACID, ED  I-STAT CG4 LACTIC ACID, ED  CBG MONITORING, ED  CBG MONITORING, ED    Imaging Review Dg Chest Port 1 View  06/28/2015  CLINICAL DATA:  70 year old male with cough, congestion and shortness of breath EXAM: PORTABLE CHEST 1 VIEW COMPARISON:  Prior chest x-ray 12/10/2014 FINDINGS: Right IJ approach single-lumen power injectable port catheter. Catheter tip in good position overlying the lower SVC. Cardiac and mediastinal contours are unchanged. Atherosclerotic calcifications in the transverse aorta. Borderline cardiomegaly similar compared to prior. The lungs are clear. No pulmonary edema, focal airspace consolidation, pleural effusion or pneumothorax. No acute osseous abnormality. IMPRESSION: No active disease. Electronically Signed   By: Jacqulynn Cadet M.D.   On: 06/28/2015 14:41   I have personally reviewed and evaluated these images and lab  results as part of my medical decision-making.   EKG Interpretation None      MDM   Final diagnoses:  Febrile illness  Leukopenia  Hypoglycemia  Hypotension, unspecified hypotension type    Febrile illness with recent chemotherapy, and leukopenia, and neutropenia, currently. He will require close observation, treatment with broad-spectrum antibiotics and repeat testing and evaluation. Initial lactate was normal.   Nursing Notes Reviewed/ Care Coordinated Applicable Imaging Reviewed Interpretation of Laboratory Data incorporated into ED treatment   Plan: Admit  Daleen Bo, MD 06/28/15 1753

## 2015-06-28 NOTE — H&P (Signed)
History and Physical  Billy Gray [id number] DOB: 1945-11-02 DOA: 06/28/2015  Referring physician: Dr Eulis Foster, ED physician PCP: Pcp Not In System   Chief Complaint: Billy Gray  HPI: Billy Gray is a 70 y.o. male  With a history of diabetes type 2, hypertension, non-Hodgkin's B cell lymphoma, COPD oxygen dependent, chronic kidney disease. Patient has had malaise with diminished appetite and nausea over the past 3 or 4 days. Patient states that he has been feeling cold, but denies a fever. His appetite is not improved with food. He denies nausea and vomiting, although he has had an episode of diarrhea which improved with Imodium. Additionally, he has had a intermittent cough that was productive with white phlegm. No. No provoking factors.   Review of Systems:   Pt denies any fevers, chills, vomiting, constipation, abdominal pain, shortness of breath, dyspnea on exertion, orthopnea, wheezing, palpitations, headache, vision changes, lightheadedness, dizziness, diarrhea, constipation, melena, rectal bleeding.  Review of systems are otherwise negative  Past Medical History  Diagnosis Date  . Hypertension   . Diabetes mellitus without complication ([place])   . Chronic kidney disease   . NHL (non-Hodgkin's lymphoma) ([place])   . Lymphoma ([place])     B cell lymphoma  . COPD (chronic obstructive pulmonary disease) ([place])     O2 dependent   Past Surgical History  Procedure Laterality Date  . Replacement total knee      right  . Appendectomy     Social History:  reports that he quit smoking about 13 years ago. He does not have any smokeless tobacco history on file. He reports that he does not drink alcohol or use illicit drugs. Patient lives at home & is able to participate in activities of daily living  No Known Allergies  Family History  Problem Relation Age of Onset  . Diabetes Mother   . Diabetes Father   . Cancer Brother   . Diabetes Brother      Prior to Admission medications     Medication Sig Start Date End Date Taking? Authorizing Provider  acetaminophen (TYLENOL) 500 MG tablet Take 500 mg by mouth every 6 (six) hours as needed for moderate pain.   Yes Historical Provider, MD  albuterol (PROVENTIL HFA;VENTOLIN HFA) 108 (90 BASE) MCG/ACT inhaler Inhale 2 puffs into the lungs every 6 (six) hours as needed for wheezing or shortness of breath.   Yes Historical Provider, MD  allopurinol (ZYLOPRIM) 300 MG tablet Take 150 mg by mouth daily.   Yes Historical Provider, MD  amLODipine (NORVASC) 5 MG tablet Take 1 tablet (5 mg total) by mouth daily. 06/13/15  Yes Manon Hilding Kefalas, PA-C  budesonide-formoterol (SYMBICORT) 160-4.5 MCG/ACT inhaler Inhale 2 puffs into the lungs 2 (two) times daily.    Yes Historical Provider, MD  cholecalciferol (VITAMIN D) 1000 UNITS tablet Take 1,000 Units by mouth daily.   Yes Historical Provider, MD  citalopram (CELEXA) 40 MG tablet Take 40 mg by mouth daily.   Yes Historical Provider, MD  Cyclophosphamide (CYTOXAN IJ) Inject as directed every 21 ( twenty-one) days. To begin 05/30/15   Yes Historical Provider, MD  DOXOrubicin HCl (ADRIAMYCIN IV) Inject into the vein every 21 ( twenty-one) days. To begin 05/30/15   Yes Historical Provider, MD  gabapentin (NEURONTIN) 800 MG tablet Take 400 mg by mouth 2 (two) times daily.   Yes Historical Provider, MD  HYDROcodone-acetaminophen [facility]) 10-325 MG tablet May take 1/2 tab or 1 tab every 6 hours prn pain. Patient taking differently:  Take 0.5-1 tablets by mouth every 6 (six) hours as needed for moderate pain or severe pain.  05/23/15  Yes Manon Hilding Kefalas, PA-C  insulin glargine (LANTUS) 100 UNIT/ML injection Inject 0.35 mLs (35 Units total) into the skin at bedtime. 03/22/15  Yes Samuella Cota, MD  insulin lispro (HUMALOG) 100 UNIT/ML injection Inject into the skin. Check blood sugar three times a day and administer Humalog insulin as ordered: 151-200 = 2 units,   201-250 = 4 units,      251-300 = 6 units,    Greater than or equal to 301 = 8 units and call MD   Yes Historical Provider, MD  lidocaine-prilocaine (EMLA) cream Apply a quarter size amount to port site 1 hour prior to chemo. Do not rub in. Cover with plastic wrap. 05/26/15  Yes Patrici Ranks, MD  ondansetron (ZOFRAN) 8 MG tablet Take 1 tablet (8 mg total) by mouth every 8 (eight) hours as needed for nausea or vomiting. 05/26/15  Yes Patrici Ranks, MD  OXYGEN Inhale 3 L into the lungs continuous.   Yes Historical Provider, MD  Pegfilgrastim (NEULASTA ONPRO [place]) Inject into the skin every 21 ( twenty-one) days. To be applied after the completion of chemo   Yes Historical Provider, MD  pravastatin (PRAVACHOL) 40 MG tablet Take 40 mg by mouth every evening.   Yes Historical Provider, MD  predniSONE (DELTASONE) 20 MG tablet On Days 1-5 of chemo take 3 tabs (60mg  total). Patient taking differently: Take 60 mg by mouth See admin instructions. On Days 1-5 of chemo take 3 tabs (60mg  total). 05/27/15  Yes Patrici Ranks, MD  RiTUXimab (RITUXAN IV) Inject into the vein every 21 ( twenty-one) days. To begin 05/30/15   Yes Historical Provider, MD  tamsulosin (FLOMAX) 0.4 MG CAPS capsule Take 0.4 mg by mouth at bedtime.   Yes Historical Provider, MD  tiotropium (SPIRIVA) 18 MCG inhalation capsule Place 18 mcg into inhaler and inhale daily. 2 puffs daily   Yes Historical Provider, MD  VINCRISTINE SULFATE IV Inject into the vein every 21 ( twenty-one) days. To begin 05/30/15   Yes Historical Provider, MD  zolpidem (AMBIEN) 10 MG tablet Take 10 mg by mouth at bedtime as needed for sleep.    Yes Historical Provider, MD    Physical Exam: BP 110/59 mmHg  Pulse 58  Temp(Src) 100.2 F (37.9 C) (Oral)  Resp 19  Ht 5\' 8"  (1.727 m)  Wt 94.802 kg (209 lb)  BMI 31.79 kg/m2  SpO2 100%  General: Older Caucasian gentleman. Awake and alert and oriented x3. No acute cardiopulmonary distress.  Eyes: Pupils equal, round, reactive to light. Extraocular muscles  are intact. Sclerae anicteric and noninjected.  ENT:  Dry mucosal membranes. No mucosal lesions.  Neck: Neck supple without lymphadenopathy. No carotid bruits. No masses palpated.  Cardiovascular: Regular rate with normal S1-S2 sounds. No murmurs, rubs, gallops auscultated. No JVD.  Respiratory: Good respiratory effort with no wheezes, rales, rhonchi. Lungs clear to auscultation bilaterally.  Abdomen: Obese. Soft, nontender, nondistended. Active bowel sounds. No masses or hepatosplenomegaly  Skin: Skin clammy and warm to touch. 2+ dorsalis pedis and radial pulses. Musculoskeletal: No calf or leg pain. All major joints not erythematous nontender.  Psychiatric: Intact judgment and insight.  Neurologic: No focal neurological deficits. Cranial nerves II through XII are grossly intact.           Labs on Admission:  Basic Metabolic Panel:  Recent Labs Lab 06/28/15 1430  NA 133*  K 4.5  CL 97*  CO2 31  GLUCOSE 47*  BUN 31*  CREATININE 1.90*  CALCIUM 8.5*   Liver Function Tests:  Recent Labs Lab 06/28/15 1430  AST 13*  ALT 15*  ALKPHOS 70  BILITOT 0.8  PROT 4.7*  ALBUMIN 2.3*   No results for input(s): LIPASE, AMYLASE in the last 168 hours. No results for input(s): AMMONIA in the last 168 hours. CBC:  Recent Labs Lab 06/28/15 1430  WBC 2.1*  NEUTROABS 0.5*  HGB 8.7*  HCT 25.5*  MCV 80.7  PLT 42*   Cardiac Enzymes: No results for input(s): CKTOTAL, CKMB, CKMBINDEX, TROPONINI in the last 168 hours.  BNP (last 3 results)  Recent Labs  12/03/14 1631 03/20/15 0545  BNP 102.8* 74.0    ProBNP (last 3 results) No results for input(s): PROBNP in the last 8760 hours.  CBG:  Recent Labs Lab 06/28/15 1726  GLUCAP 109*    Radiological Exams on Admission: Dg Chest Port 1 View  06/28/2015  CLINICAL DATA:  70 year old male with cough, congestion and shortness of breath EXAM: PORTABLE CHEST 1 VIEW COMPARISON:  Prior chest x-ray 12/10/2014 FINDINGS: Right IJ  approach single-lumen power injectable port catheter. Catheter tip in good position overlying the lower SVC. Cardiac and mediastinal contours are unchanged. Atherosclerotic calcifications in the transverse aorta. Borderline cardiomegaly similar compared to prior. The lungs are clear. No pulmonary edema, focal airspace consolidation, pleural effusion or pneumothorax. No acute osseous abnormality. IMPRESSION: No active disease. Electronically Signed   By: Jacqulynn Cadet M.D.   On: 06/28/2015 14:41   Assessment/Plan Present on Admission:  . Neutropenic fever ([place]) . SIRS (systemic inflammatory response syndrome) (H[facility]) . HTN (hypertension) . Acute on chronic kidney failure ([place]) . Pancytopenia [facility])  This patient was discussed with the ED physician, including pertinent vitals, physical exam findings, labs, and imaging.  We also discussed care given by the ED provider.  #1 SIRS with hypotension  Admit to stepdown  No current etiology of patient's illness  Patient receiving fluid bolus, which has improved the patient's blood pressure  Hold antihypertensives #2 neutropenic fever  Blood cultures and urine culture pending  Continue vancomycin and cefepime  Respiratory virus panel  Blood cultures and urine cultures pending  Recheck CBC in the morning  We'll check MRSA by PCR - if negative should be able to stop vancomycin #3 hypertension  Hold antihypertensives #4 diabetes  Continue Lantus  CBGs before meals and daily at bedtime  Sliding scale insulin #5 acute on chronic kidney failure  Recheck creatinine in the morning #6 pancytopenia   DVT prophylaxis: SCDs as patient is thrombocytopenic with platelet count of 46  Consultants: None  Code Status: Full code  Family Communication: Wife in the room   Disposition Plan: Admission to stepdown   Truett Mainland, DO [facility] Pager [phone]

## 2015-06-29 DIAGNOSIS — C8330 Diffuse large B-cell lymphoma, unspecified site: Secondary | ICD-10-CM

## 2015-06-29 DIAGNOSIS — E160 Drug-induced hypoglycemia without coma: Secondary | ICD-10-CM

## 2015-06-29 DIAGNOSIS — T383X5A Adverse effect of insulin and oral hypoglycemic [antidiabetic] drugs, initial encounter: Secondary | ICD-10-CM

## 2015-06-29 DIAGNOSIS — E1121 Type 2 diabetes mellitus with diabetic nephropathy: Secondary | ICD-10-CM

## 2015-06-29 DIAGNOSIS — J9611 Chronic respiratory failure with hypoxia: Secondary | ICD-10-CM

## 2015-06-29 DIAGNOSIS — J449 Chronic obstructive pulmonary disease, unspecified: Secondary | ICD-10-CM | POA: Diagnosis present

## 2015-06-29 LAB — BASIC METABOLIC PANEL
ANION GAP: 5 (ref 5–15)
BUN: 29 mg/dL — ABNORMAL HIGH (ref 6–20)
CALCIUM: 7.9 mg/dL — AB (ref 8.9–10.3)
CO2: 29 mmol/L (ref 22–32)
Chloride: 100 mmol/L — ABNORMAL LOW (ref 101–111)
Creatinine, Ser: 1.97 mg/dL — ABNORMAL HIGH (ref 0.61–1.24)
GFR calc Af Amer: 38 mL/min — ABNORMAL LOW (ref >60)
GFR calc non Af Amer: 33 mL/min — ABNORMAL LOW (ref >60)
GLUCOSE: 81 mg/dL (ref 65–99)
POTASSIUM: 4.8 mmol/L (ref 3.5–5.1)
Sodium: 134 mmol/L — ABNORMAL LOW (ref 135–145)

## 2015-06-29 LAB — CBC WITH DIFFERENTIAL/PLATELET
BASOS ABS: 0 10*3/uL (ref 0.0–0.1)
Basophils Relative: 0 %
EOS PCT: 2 %
Eosinophils Absolute: 0.1 10*3/uL (ref 0.0–0.7)
HCT: 23.2 % — ABNORMAL LOW (ref 39.0–52.0)
HEMOGLOBIN: 7.8 g/dL — AB (ref 13.0–17.0)
LYMPHS PCT: 14 %
Lymphs Abs: 0.3 10*3/uL — ABNORMAL LOW (ref 0.7–4.0)
MCH: 27.7 pg (ref 26.0–34.0)
MCHC: 33.6 g/dL (ref 30.0–36.0)
MCV: 82.3 fL (ref 78.0–100.0)
Monocytes Absolute: 0.3 10*3/uL (ref 0.1–1.0)
Monocytes Relative: 15 %
NEUTROS PCT: 69 %
Neutro Abs: 1.6 10*3/uL — ABNORMAL LOW (ref 1.7–7.7)
PLATELETS: 56 10*3/uL — AB (ref 150–400)
RBC: 2.82 MIL/uL — AB (ref 4.22–5.81)
RDW: 13.9 % (ref 11.5–15.5)
WBC: 2.3 10*3/uL — AB (ref 4.0–10.5)

## 2015-06-29 LAB — GLUCOSE, CAPILLARY
GLUCOSE-CAPILLARY: 41 mg/dL — AB (ref 65–99)
GLUCOSE-CAPILLARY: 197 mg/dL — AB (ref 65–99)
GLUCOSE-CAPILLARY: 129 mg/dL — AB (ref 65–99)
Glucose-Capillary: 38 mg/dL — CL (ref 65–99)
Glucose-Capillary: 136 mg/dL — ABNORMAL HIGH (ref 65–99)
Glucose-Capillary: 96 mg/dL (ref 65–99)

## 2015-06-29 LAB — CBC
HEMATOCRIT: 23.4 % — AB (ref 39.0–52.0)
HEMOGLOBIN: 7.7 g/dL — AB (ref 13.0–17.0)
MCH: 27.2 pg (ref 26.0–34.0)
MCHC: 32.9 g/dL (ref 30.0–36.0)
MCV: 82.7 fL (ref 78.0–100.0)
Platelets: 45 10*3/uL — ABNORMAL LOW (ref 150–400)
RBC: 2.83 MIL/uL — ABNORMAL LOW (ref 4.22–5.81)
RDW: 13.5 % (ref 11.5–15.5)
WBC: 1.3 10*3/uL — CL (ref 4.0–10.5)

## 2015-06-29 LAB — ABO/RH: ABO/RH(D): O POS

## 2015-06-29 LAB — PREPARE RBC (CROSSMATCH)

## 2015-06-29 LAB — MRSA PCR SCREENING: MRSA by PCR: NEGATIVE

## 2015-06-29 MED ORDER — GABAPENTIN 400 MG PO CAPS
400.0000 mg | ORAL_CAPSULE | Freq: Two times a day (BID) | ORAL | Status: DC
  Administered 2015-06-29 – 2015-07-01 (×5): 400 mg via ORAL
  Filled 2015-06-29 (×5): qty 1

## 2015-06-29 MED ORDER — DEXTROSE 50 % IV SOLN
25.0000 mL | INTRAVENOUS | Status: AC
  Administered 2015-06-29: 25 mL via INTRAVENOUS

## 2015-06-29 MED ORDER — DEXTROSE 50 % IV SOLN
INTRAVENOUS | Status: AC
  Administered 2015-06-29: 25 mL
  Filled 2015-06-29: qty 50

## 2015-06-29 MED ORDER — VANCOMYCIN HCL 10 G IV SOLR
1250.0000 mg | INTRAVENOUS | Status: DC
  Administered 2015-06-29: 1250 mg via INTRAVENOUS
  Filled 2015-06-29 (×2): qty 1250

## 2015-06-29 MED ORDER — SODIUM CHLORIDE 0.9 % IV SOLN: Freq: Once | INTRAVENOUS | Status: DC

## 2015-06-29 NOTE — Progress Notes (Signed)
Hypoglycemic Event  CBG: 41  Treatment: D50 IV 25 mL  Symptoms: None  Follow-up CBG: [id number] CBG Result:136  Possible Reasons for Event: Unknown  Comments/MD notified:yes    Gennaro Lizotte, Shirlean Schlein

## 2015-06-29 NOTE — Progress Notes (Signed)
Hypoglycemic Event  CBG: 38  Treatment: 15 GM carbohydrate snack Peanut butter and crackers juice Symptoms: None None alert and oriented Follow-up CBG: [id number] CBG Result:41  Possible Reasons for Event: Unknown  Comments/MD notified:yes Dr. Sharen Hones, Shirlean Schlein

## 2015-06-29 NOTE — Progress Notes (Signed)
Patient in the recliner chair. Patient states he feels better sitting in chair.

## 2015-06-29 NOTE — Progress Notes (Addendum)
[facility] PROGRESS NOTE  Alani Carrejo [id number] DOB: 01/17/1946 DOA: 06/28/2015 PCP: Pcp Not In System    Code Status: Full code Family Communication: Discussed with wife Disposition Plan: Discharge when clinically appropriate   Consultants:  We'll consult oncology  Procedures:  None  Antibiotics:  Vancomycin 06/28/15>>  Cefepime 06/28/15>>  HPI/Subjective: Nursing reports a low blood sugar of 41 this morning. The patient was apparently asymptomatic. He was treated appropriately. Currently, the patient's only complaint is of generalized fatigue and malaise. He has a cough with a small amount of clear sputum.  Objective: Filed Vitals:   06/29/15 0721 06/29/15 0800  BP:  120/65  Pulse:  92  Temp: 98.9 F (37.2 C)   Resp:  19  T max 100.2. Current temperature 98.9. Oxygen saturation 100% on nasal cannula oxygen  Intake/Output Summary (Last 24 hours) at 06/29/15 0954 Last data filed at 06/29/15 0600  Gross per 24 hour  Intake 1656.66 ml  Output    600 ml  Net 1056.66 ml   Filed Weights   06/28/15 1408 06/28/15 1918 06/29/15 0500  Weight: 94.802 kg (209 lb) 99.2 kg (218 lb 11.1 oz) 100.6 kg (221 lb 12.5 oz)    Exam:   General:  Obese 70 year old man sitting up in bed, in no acute distress.  Cardiovascular: S1, S2, with a soft systolic murmur.  Respiratory: Decreased breath sound at bases, otherwise clear.  Abdomen: Obese, positive bowel sounds, soft, nontender, nondistended.  Musculoskeletal/extremities: Trace to 1+ bilateral pedal edema.   Neurologic: He is alert and oriented 3. His speech is clear.  Data Reviewed: Basic Metabolic Panel:  Recent Labs Lab 06/28/15 1430 06/29/15 0453  NA 133* 134*  K 4.5 4.8  CL 97* 100*  CO2 31 29  GLUCOSE 47* 81  BUN 31* 29*  CREATININE 1.90* 1.97*  CALCIUM 8.5* 7.9*   Liver Function Tests:  Recent Labs Lab 06/28/15 1430  AST 13*  ALT 15*  ALKPHOS 70  BILITOT 0.8  PROT 4.7*  ALBUMIN 2.3*    No results for input(s): LIPASE, AMYLASE in the last 168 hours. No results for input(s): AMMONIA in the last 168 hours. CBC:  Recent Labs Lab 06/28/15 1430 06/29/15 0453  WBC 2.1* 1.3*  NEUTROABS 0.5*  --   HGB 8.7* 7.7*  HCT 25.5* 23.4*  MCV 80.7 82.7  PLT 42* 45*   Cardiac Enzymes: No results for input(s): CKTOTAL, CKMB, CKMBINDEX, TROPONINI in the last 168 hours. BNP (last 3 results)  Recent Labs  12/03/14 1631 03/20/15 0545  BNP 102.8* 74.0    ProBNP (last 3 results) No results for input(s): PROBNP in the last 8760 hours.  CBG:  Recent Labs Lab 06/28/15 1726 06/28/15 2110 06/29/15 0733 06/29/15 0759 06/29/15 0833  GLUCAP 109* 111* 38* 41* 136*    Recent Results (from the past 240 hour(s))  Blood Culture (routine x 2)     Status: None (Preliminary result)   Collection Time: 06/28/15  2:30 PM  Result Value Ref Range Status   Specimen Description BLOOD PICC  Final   Special Requests BOTTLES DRAWN AEROBIC AND ANAEROBIC 5CC EACH  Final   Culture NO GROWTH < 24 HOURS  Final   Report Status PENDING  Incomplete  Blood Culture (routine x 2)     Status: None (Preliminary result)   Collection Time: 06/28/15  2:37 PM  Result Value Ref Range Status   Specimen Description RIGHT ANTECUBITAL  Final   Special Requests BOTTLES DRAWN AEROBIC AND ANAEROBIC  [place]  Final   Culture PENDING  Incomplete   Report Status PENDING  Incomplete  MRSA PCR Screening     Status: None   Collection Time: 06/28/15  6:49 PM  Result Value Ref Range Status   MRSA by PCR NEGATIVE NEGATIVE Final    Comment:        The GeneXpert MRSA Assay (FDA approved for NASAL specimens only), is one component of a comprehensive MRSA colonization surveillance program. It is not intended to diagnose MRSA infection nor to guide or monitor treatment for MRSA infections.      Studies: Dg Chest Port 1 View  06/28/2015  CLINICAL DATA:  70 year old male with cough, congestion and shortness of  breath EXAM: PORTABLE CHEST 1 VIEW COMPARISON:  Prior chest x-ray 12/10/2014 FINDINGS: Right IJ approach single-lumen power injectable port catheter. Catheter tip in good position overlying the lower SVC. Cardiac and mediastinal contours are unchanged. Atherosclerotic calcifications in the transverse aorta. Borderline cardiomegaly similar compared to prior. The lungs are clear. No pulmonary edema, focal airspace consolidation, pleural effusion or pneumothorax. No acute osseous abnormality. IMPRESSION: No active disease. Electronically Signed   By: Jacqulynn Cadet M.D.   On: 06/28/2015 14:41    Scheduled Meds: . allopurinol  150 mg Oral Daily  . budesonide-formoterol  2 puff Inhalation BID  . ceFEPime (MAXIPIME) IV  2 g Intravenous Q12H  . gabapentin  400 mg Oral BID  . guaiFENesin  600 mg Oral BID  . insulin aspart  0-15 Units Subcutaneous TID WC  . insulin glargine  35 Units Subcutaneous QHS  . pravastatin  40 mg Oral QPM  . sodium chloride  1,000 mL Intravenous Once  . sodium chloride  3 mL Intravenous Q12H  . tamsulosin  0.4 mg Oral QHS  . tiotropium  18 mcg Inhalation Daily  . vancomycin  1,250 mg Intravenous Q24H   Continuous Infusions: . sodium chloride 125 mL/hr at 06/29/15 0550   Assessment and plan:  Principal Problem:   Neutropenic fever ([place]) Active Problems:   SIRS (systemic inflammatory response syndrome) (HCC)   Chronic respiratory failure with hypoxia (HCC)   Pancytopenia (HCC)   COPD (chronic obstructive pulmonary disease) (HCC)   Acute on chronic kidney failure (HCC)   Essential hypertension   Diabetes mellitus with nephropathy (HCC)   DLBCL (diffuse large B cell lymphoma) (HCC)   Hypoglycemia due to insulin  1. Neutropenic fever associated with SIRS, associated with generalized malaise. Patient urinalysis and chest x-ray results were not obviously consistent with infection. The patient may have some sort of upper respiratory infection, but in the setting of  neutropenic fever, the start of broad-spectrum antibiotics was reasonable. He also may be a little volume depleted/dehydrated given the increase in his BUN and creatinine above baseline. -Blood cultures ordered and are pending. Influenza panel ordered and is pending. -We'll order vitamin B12 level and TSH. -We'll continue IV cefepime and vancomycin. Continue gentle IV fluids.  Pancytopenia, with anemia, thrombocytopenia, and leukopenia.  -The pancytopenia thought to be secondary to recent chemotherapy. He undergoes chemotherapy for non-Hodgkin's lymphoma. He is followed by Dr. Whitney Muse. His WBC was within normal limits at 5.2 one week ago. It was 2.1 on admission and has fallen to 1.3. His ANC is 0.5. His hemoglobin has fallen from 10.8 one week ago to 8.7 on admission to 7.7 today. His platelet count has fallen from 267 one week ago to 42. -In reviewing his chart, it appears that he received Neulasta on 06/20/15. -  We will order another CBC today. If his hemoglobin is still below 8, will transfuse packed red blood cells. Otherwise, will monitor his CBC daily. -We'll consult Dr. Whitney Muse for further recommendations.  Type 2 diabetes with nephropathy. Hypoglycemia due to insulin. The patient is treated with Lantus and Humalog at home. Lantus and NovoLog were started. His CBG fell to the 40s on 06/29/15. The patient was treated with D50 with improvement. -Have already discontinued Lantus. Will order hemoglobin A1c. We'll continue to monitor. -We'll continue gabapentin.  Chronic hypoxic respiratory failure secondary to COPD. This appears to be compensated. He has no wheezes on exam. We'll continue Symbicort, Spiriva, and Mucinex.  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 Patient's baseline creatinine per chart review over the past few weeks, is 1.6-1.8. It was 1.9 on admission. -Patient was started on IV fluids. This will be continued. His renal function will  continue to be monitored. Likely etiology is prerenal azotemia secondary to poor oral intake.  Essential hypertension. The patient is treated chronically with amlodipine. It is currently on hold secondary to low-normal blood pressures.  Chronic pedal edema. Patient says swelling in his feet are chronic. 2-D echocardiogram from December 2016 was reviewed and apparently the patient has an EF of 60% and grade 1 diastolic dysfunction. I cannot say with certainty that his swelling is from diastolic heart failure. He is on amlodipine which can cause pedal edema. -We'll keep his legs elevated and consider as needed Lasix.     Time spent: 35 minutes     [facility] Pager: [phone] If 7PM-7AM, please contact night-coverage at [email or web address], password [facility] 06/29/2015, 9:54 AM  LOS: 1 day

## 2015-06-30 ENCOUNTER — Inpatient Hospital Stay (HOSPITAL_COMMUNITY): Payer: Medicare Other

## 2015-06-30 ENCOUNTER — Other Ambulatory Visit (HOSPITAL_COMMUNITY): Payer: Self-pay | Admitting: Oncology

## 2015-06-30 DIAGNOSIS — C8333 Diffuse large B-cell lymphoma, intra-abdominal lymph nodes: Secondary | ICD-10-CM

## 2015-06-30 DIAGNOSIS — G629 Polyneuropathy, unspecified: Secondary | ICD-10-CM

## 2015-06-30 DIAGNOSIS — I959 Hypotension, unspecified: Secondary | ICD-10-CM

## 2015-06-30 LAB — BASIC METABOLIC PANEL
ANION GAP: 6 (ref 5–15)
BUN: 25 mg/dL — AB (ref 6–20)
CALCIUM: 8.1 mg/dL — AB (ref 8.9–10.3)
CO2: 27 mmol/L (ref 22–32)
CREATININE: 1.77 mg/dL — AB (ref 0.61–1.24)
Chloride: 102 mmol/L (ref 101–111)
GFR calc Af Amer: 44 mL/min — ABNORMAL LOW (ref >60)
GFR calc non Af Amer: 38 mL/min — ABNORMAL LOW (ref >60)
GLUCOSE: 72 mg/dL (ref 65–99)
Potassium: 4.3 mmol/L (ref 3.5–5.1)
Sodium: 135 mmol/L (ref 135–145)

## 2015-06-30 LAB — URINE CULTURE

## 2015-06-30 LAB — CBC WITH DIFFERENTIAL/PLATELET
BASOS PCT: 0 %
Basophils Absolute: 0 10*3/uL (ref 0.0–0.1)
EOS PCT: 2 %
Eosinophils Absolute: 0.1 10*3/uL (ref 0.0–0.7)
HEMATOCRIT: 26.1 % — AB (ref 39.0–52.0)
Hemoglobin: 8.8 g/dL — ABNORMAL LOW (ref 13.0–17.0)
LYMPHS ABS: 0.3 10*3/uL — AB (ref 0.7–4.0)
Lymphocytes Relative: 10 %
MCH: 27.6 pg (ref 26.0–34.0)
MCHC: 33.7 g/dL (ref 30.0–36.0)
MCV: 81.8 fL (ref 78.0–100.0)
MONO ABS: 0.4 10*3/uL (ref 0.1–1.0)
Monocytes Relative: 15 %
NEUTROS ABS: 2.1 10*3/uL (ref 1.7–7.7)
Neutrophils Relative %: 73 %
Platelets: 66 10*3/uL — ABNORMAL LOW (ref 150–400)
RBC: 3.19 MIL/uL — ABNORMAL LOW (ref 4.22–5.81)
RDW: 13.8 % (ref 11.5–15.5)
WBC Morphology: INCREASED
WBC: 2.9 10*3/uL — ABNORMAL LOW (ref 4.0–10.5)

## 2015-06-30 LAB — INFLUENZA PANEL BY PCR (TYPE A & B)
H1N1 flu by pcr: NOT DETECTED
INFLAPCR: NEGATIVE
INFLBPCR: NEGATIVE

## 2015-06-30 LAB — TSH: TSH: 1.012 u[IU]/mL (ref 0.350–4.500)

## 2015-06-30 LAB — TYPE AND SCREEN
ABO/RH(D): O POS
Antibody Screen: NEGATIVE
Unit division: 0

## 2015-06-30 LAB — GLUCOSE, CAPILLARY
GLUCOSE-CAPILLARY: 189 mg/dL — AB (ref 65–99)
GLUCOSE-CAPILLARY: 125 mg/dL — AB (ref 65–99)
Glucose-Capillary: 37 mg/dL — CL (ref 65–99)
Glucose-Capillary: 50 mg/dL — ABNORMAL LOW (ref 65–99)
Glucose-Capillary: 69 mg/dL (ref 65–99)
Glucose-Capillary: 179 mg/dL — ABNORMAL HIGH (ref 65–99)

## 2015-06-30 LAB — VITAMIN B12: Vitamin B-12: 450 pg/mL (ref 180–914)

## 2015-06-30 MED ORDER — INSULIN ASPART 100 UNIT/ML ~~LOC~~ SOLN: 0.0000 [IU] | Freq: Every day | SUBCUTANEOUS | Status: DC

## 2015-06-30 MED ORDER — IPRATROPIUM-ALBUTEROL 0.5-2.5 (3) MG/3ML IN SOLN
3.0000 mL | RESPIRATORY_TRACT | Status: DC
  Administered 2015-06-30 – 2015-07-01 (×4): 3 mL via RESPIRATORY_TRACT
  Filled 2015-06-30 (×5): qty 3

## 2015-06-30 MED ORDER — IPRATROPIUM-ALBUTEROL 0.5-2.5 (3) MG/3ML IN SOLN
3.0000 mL | RESPIRATORY_TRACT | Status: AC
  Administered 2015-06-30: 3 mL via RESPIRATORY_TRACT
  Filled 2015-06-30: qty 3

## 2015-06-30 MED ORDER — INSULIN ASPART 100 UNIT/ML ~~LOC~~ SOLN
0.0000 [IU] | Freq: Three times a day (TID) | SUBCUTANEOUS | Status: DC
  Administered 2015-06-30: 1 [IU] via SUBCUTANEOUS
  Administered 2015-07-01: 3 [IU] via SUBCUTANEOUS
  Administered 2015-07-01: 2 [IU] via SUBCUTANEOUS

## 2015-06-30 MED ORDER — DEXTROSE-NACL 5-0.9 % IV SOLN
INTRAVENOUS | Status: DC
  Administered 2015-06-30 – 2015-07-01 (×2): via INTRAVENOUS

## 2015-06-30 MED ORDER — DEXTROSE 50 % IV SOLN
INTRAVENOUS | Status: AC
  Administered 2015-06-30: 25 mL
  Filled 2015-06-30: qty 50

## 2015-06-30 NOTE — Care Management Note (Signed)
Case Management Note  Patient Details  Name: Billy Gray MRN: [id number] Date of Birth: 1946-04-26  Subjective/Objective:                  Pt is from home with wife and is ind with ADL's. Pt is [facility] affiliated. Goes to [place] [facility] and [place] notified of admission. Contact info for pt's case manager at [facility] given Jola Baptist [phone]. Pt Pt has home O2 and CPAP through [facility] at home prior to admission. Pt has no HH services or other DME's prior to admission. Pt plans to return home with self care at DC.   Action/Plan: Will cont to follow, will provide [facility] with updates at DC.   Expected Discharge Date:    07/02/2015              Expected Discharge Plan:  Home/Self Care  In-House Referral:  NA  Discharge planning Services  CM Consult  Post Acute Care Choice:  NA Choice offered to:  NA  DME Arranged:    DME Agency:     HH Arranged:    HH Agency:     Status of Service:  In process, will continue to follow  Medicare Important Message Given:    Date Medicare IM Given:    Medicare IM give by:    Date Additional Medicare IM Given:    Additional Medicare Important Message give by:     If discussed at [place] of Stay Meetings, dates discussed:    Additional Comments:  Sherald Barge, RN 06/30/2015, 11:22 AM

## 2015-06-30 NOTE — Progress Notes (Signed)
Hypoglycemic Event  CBG: 50  Treatment: 15 GM carbohydrate snack   Symptoms: None  Follow-up CBG: Time:0940 CBG Result:69  Possible Reasons for Event: Unknown  Comments/MD notified:MD Fisher aware. Recheck CBG 69-1/2 amp D50 given. Primary fluids changed to D5NS@70 . Will cont to monitor.    Waylynn Benefiel L

## 2015-06-30 NOTE — Progress Notes (Signed)
Report called to [facility] on 300. Patient receiving breathing treatment at this time. Will transport to floor via wheelchair after breathing treatment.

## 2015-06-30 NOTE — Evaluation (Signed)
Physical Therapy Evaluation Patient Details Name: Billy Gray MRN: [id number] DOB: [birth year] Today's Date: 06/30/2015   History of Present Illness  history of diabetes type 2, hypertension, non-Hodgkin's B cell lymphoma, COPD oxygen dependent, chronic kidney disease. Patient has had malaise with diminished appetite and nausea over the past 3 or 4 days. Patient states that he has been feeling cold, but denies a fever. His appetite is not improved with food. He denies nausea and vomiting, although he has had an episode of diarrhea which improved with Imodium. Additionally, he has had a intermittent cough that was productive with white phlegm  Clinical Impression  Billy Gray states that he normally uses O2 but no assistive device at home.  Pt was able to ambulate with O2 and no assistive device for 80 ft. with moderate shortness of breath.  Pt does not need skilled physical therapy services at this time.  Pt will benefit from nursing staff ambulating and encouraging pt to get into a chair for meals to keep from losing strength.    Follow Up Recommendations No PT follow up    Equipment Recommendations    O2 (patient already has)   Recommendations for Other Services   none    Precautions / Restrictions Precautions Precautions: None Restrictions Weight Bearing Restrictions: No      Mobility  Bed Mobility Overal bed mobility: Modified Independent                Transfers Overall transfer level: Modified independent                  Ambulation/Gait Ambulation/Gait assistance: Modified independent (Device/Increase time) Ambulation Distance (Feet): 80 Feet Assistive device: None Gait Pattern/deviations: Decreased step length - left;Decreased step length - right   Gait velocity interpretation: Below normal speed for age/gender              Pertinent Vitals/Pain Pain Assessment: 0-10 Pain Score: 3  Pain Location: stomach from coughing Pain Descriptors /  Indicators: Sore    Home Living Family/patient expects to be discharged to:: Private residence Living Arrangements: Spouse/significant other Available Help at Discharge: Family Type of Home: House Home Access: Stairs to enter   Technical brewer of Steps: 2 Home Layout: One level Home Equipment: None      Prior Function Level of Independence: Independent                  Extremity/Trunk Assessment               Lower Extremity Assessment: Overall WFL for tasks assessed         Communication   Communication: No difficulties  Cognition Arousal/Alertness: Awake/alert Behavior During Therapy: WFL for tasks assessed/performed Overall Cognitive Status: Within Functional Limits for tasks assessed                         Exercises General Exercises - Lower Extremity Ankle Circles/Pumps: Both Heel Slides: Both;10 reps Hip ABduction/ADduction: Both Straight Leg Raises: Both;5 reps      Assessment/Plan    PT Assessment Patent does not need any further PT services  PT Diagnosis     PT Problem List    PT Treatment Interventions     PT Goals (Current goals can be found in the Care Plan section)                 End of Session Equipment Utilized During Treatment: Gait belt Activity Tolerance: Treatment limited secondary to  medical complications (Comment) (SOB) Patient left: with family/visitor present (pt request to sit on side of bed; family states they will assist )           Time: [id number] PT Time Calculation (min) (ACUTE ONLY): 24 min   Charges:   PT Evaluation $PT Eval Low Complexity: 1 Procedure           Rayetta Humphrey, PT CLT [phone] 06/30/2015, 4:12 PM

## 2015-06-30 NOTE — Consult Note (Signed)
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  HISTORY OF PRESENT ILLNESS:   Billy Gray is a pleasant 70 yo white American man who is well known to the [facility] where he is actively undergoing systemic chemotherapy with curative intent for DLBCL.  He is day 11 of cycle #2 of R-CHOP with Neulasta support (06/20/2015 was day 1 of cycle 2).    DLBCL (diffuse large B cell lymphoma) ([place])   12/03/2014 Imaging CT CAP- Prominent retroperitoneal lymphadenopathy most likely to represent lymphoma.    12/05/2014 Pathology Results Soft Tissue Needle Core Biopsy, right retroperitoneal - FOLLICULAR LYMPHOMA. Low grade. lymphocytes are positive for CD20, CD79a, CD10, bcl-6, and bcl-2. 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   12/05/2014 Pathology Results Tissue-Flow Cytometry - MONOCLONAL B-CELL POPULATION IDENTIFIED.  There is a monoclonal population of B-cell with expression of CD10. 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   12/11/2014 - 04/08/2015 Chemotherapy Rituxan, cytoxan, prednisone x 6 cycles   02/10/2015 Imaging CT CAP- Interval response to therapy as evidenced by decrease in size of retroperitoneal and small bowel mesenteric adenopathy.   05/06/2015 Progression CT CAP- Significant disease progression within the abdomen and pelvis with enlarging retroperitoneal masses as described. The previously demonstrated dominant right periaortic lesion centered inferior to the renal veins has slightly decreased in size.   05/19/2015 Procedure Retroperitoneal mass biopsy by IR   05/19/2015 Pathology Results  Retroperitoneal mass, biopsy - DIFFUSE LARGE B-CELL LYMPHOMA.   05/19/2015 Progression Transformation to DLBCL   05/29/2015 Procedure Bone marrow aspiration and biopsy by IR   05/29/2015 Pathology Results Bone Marrow, Aspirate,Biopsy, and Clot, R iliac - SLIGHTLY HYPERCELLULAR BONE MARROW FOR AGE WITH TRILINEAGE HEMATOPOIESIS. - NO TUMOR IDENTIFIED.   05/29/2015 Pathology Results Bone Marrow Flow Cytometry - EXCLUSIVE PRESENCE OF T CELLS WITH NONSPECIFIC CHANGES.   05/30/2015 -  Chemotherapy R-CHOP with Neulasta support    Chart reviewed.  He presented to the [facility] ED on 06/28/2015 with cough and generalized body aches.  He was noted to have episodes of Hypotension in addition to a temperature of 100.2 F  He notes rigors.  He denies any nausea or vomiting.  He notes SOB, but reports that it is at baseline.  He does have a wet cough, but he cannot cough up sputum.  He reports this is new since admission.  He reports cycle 2 was more difficult with regards to weakness than his first cycle.    I personally reviewed and went over laboratory results with the patient.  The results are noted within this dictation.  His neutropenia has resolved.  I personally reviewed and went over radiographic studies with the patient.  The results are noted within this dictation.  Chest xray at time of admission was unimpressive.  With new cough and recovered WBC, will recheck chest imaging to evaluate for new findings.  He notes he feels somewhat better today.  He is currently up in a chair and eating.    PAST MEDICAL HISTORY:   Past Medical History  Diagnosis Date  . Hypertension   . Diabetes mellitus without complication ([place])   .  Chronic kidney disease   . NHL (non-Hodgkin's lymphoma) ([place])   . Lymphoma ([place])     B cell lymphoma  . 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    ALLERGIES: No Known Allergies    MEDICATIONS: I have reviewed the patient's current medications.      No current facility-administered medications on file prior to encounter.   Current Outpatient Prescriptions on File Prior to Encounter  Medication Sig Dispense Refill  . acetaminophen (TYLENOL) 500 MG tablet Take 500 mg by mouth every 6 (six) hours as needed for moderate pain.    Marland Kitchen albuterol (PROVENTIL HFA;VENTOLIN HFA) 108 (90 BASE) MCG/ACT inhaler Inhale 2 puffs into the lungs every 6 (six) hours as needed for wheezing or shortness of breath.    . allopurinol (ZYLOPRIM) 300 MG tablet Take 150 mg by mouth daily.    Marland Kitchen amLODipine (NORVASC) 5 MG tablet Take 1 tablet (5 mg total) by mouth daily. 30 tablet 4  . budesonide-formoterol (SYMBICORT) 160-4.5 MCG/ACT inhaler Inhale 2 puffs into the lungs 2 (two) times daily.     . cholecalciferol (VITAMIN D) 1000 UNITS tablet Take 1,000 Units by mouth daily.    . citalopram (CELEXA) 40 MG tablet Take 40 mg by mouth daily.    . Cyclophosphamide (CYTOXAN IJ) Inject as directed every 21 ( twenty-one) days. To begin 05/30/15    . DOXOrubicin HCl (ADRIAMYCIN IV) Inject into the vein every 21 ( twenty-one) days. To begin 05/30/15    . HYDROcodone-acetaminophen (NORCO) 10-325 MG tablet May take 1/2 tab or 1 tab every 6 hours prn pain. (Patient taking differently: Take 0.5-1 tablets by mouth every 6 (six) hours as needed for moderate pain or severe pain. ) 60 tablet 0  . insulin glargine (LANTUS) 100 UNIT/ML injection Inject 0.35 mLs (35 Units total) into the skin at bedtime.    . insulin lispro (HUMALOG) 100 UNIT/ML injection Inject into the skin. Check blood sugar three times a day and administer Humalog insulin as ordered: 151-200 = 2 units,   201-250 = 4 units,      251-300 = 6 units,   Greater than or equal to 301 = 8 units and call MD    . lidocaine-prilocaine (EMLA) cream Apply a quarter size amount to port site 1 hour prior to chemo. Do not rub in. Cover with plastic wrap. 30 g 3  . ondansetron (ZOFRAN) 8 MG tablet Take 1 tablet (8 mg total) by mouth every 8  (eight) hours as needed for nausea or vomiting. 30 tablet 2  . OXYGEN Inhale 3 L into the lungs continuous.    . Pegfilgrastim (NEULASTA ONPRO [place]) Inject into the skin every 21 ( twenty-one) days. To be applied after the completion of chemo    . pravastatin (PRAVACHOL) 40 MG tablet Take 40 mg by mouth every evening.    . predniSONE (DELTASONE) 20 MG tablet On Days 1-5 of chemo take 3 tabs (100m total). (Patient taking differently: Take 60 mg by mouth See admin instructions. On Days 1-5 of chemo take 3 tabs (613mtotal).) 45 tablet 1  . RiTUXimab (RITUXAN IV) Inject into the vein every 21 ( twenty-one) days. To begin 05/30/15    . tamsulosin (FLOMAX) 0.4 MG CAPS capsule Take 0.4 mg by mouth at bedtime.    . Marland Kitcheniotropium (SPIRIVA) 18 MCG inhalation capsule Place 18 mcg into inhaler and inhale daily. 2 puffs daily    . VINCRISTINE SULFATE IV Inject into  the vein every 21 ( twenty-one) days. To begin 05/30/15    . zolpidem (AMBIEN) 10 MG tablet Take 10 mg by mouth at bedtime as needed for sleep.        PAST SURGICAL HISTORY Past Surgical History  Procedure Laterality Date  . Replacement total knee      right  . Appendectomy      FAMILY HISTORY: Family History  Problem Relation Age of Onset  . Diabetes Mother   . Diabetes Father   . Cancer Brother   . Diabetes Brother     SOCIAL HISTORY:  reports that he quit smoking about 13 years ago. He does not have any smokeless tobacco history on file. He reports that he does not drink alcohol or use illicit drugs.  PERFORMANCE STATUS: The patient's performance status is 2 - Symptomatic, <50% confined to bed  PHYSICAL EXAM: Most Recent Vital Signs: Blood pressure 132/71, pulse 111, temperature 97 F (36.1 C), temperature source Oral, resp. rate 23, height 5' 8"  (1.727 m), weight 221 lb 12.5 oz (100.6 kg), SpO2 100 %. General appearance: alert, cooperative, appears older than stated age, mild distress, moderately obese and [place] in place for O2  delivery, wife, son and grandson at the bedside. Head: Normocephalic, without obvious abnormality, atraumatic, partial alopecia Eyes: negative findings: lids and lashes normal, conjunctivae and sclerae normal and corneas clear Lungs: diminished breath sounds bilaterally Heart: regular rate and rhythm Abdomen: soft, non-tender; bowel sounds normal; no masses,  no organomegaly Skin: Skin color, texture, turgor normal. No rashes or lesions Neurologic: Grossly normal  LABORATORY DATA:  Results for orders placed or performed during the hospital encounter of 06/28/15 (from the past 48 hour(s))  Urinalysis, Routine w reflex microscopic (not at [facility])     Status: Abnormal   Collection Time: 06/28/15  3:28 PM  Result Value Ref Range   Color, Urine AMBER (A) YELLOW    Comment: BIOCHEMICALS MAY BE AFFECTED BY COLOR   APPearance CLEAR CLEAR   Specific Gravity, Urine 1.020 1.005 - 1.030   pH 6.0 5.0 - 8.0   Glucose, UA NEGATIVE NEGATIVE mg/dL   Hgb urine dipstick TRACE (A) NEGATIVE   Bilirubin Urine NEGATIVE NEGATIVE   Ketones, ur NEGATIVE NEGATIVE mg/dL   Protein, ur 100 (A) NEGATIVE mg/dL   Nitrite NEGATIVE NEGATIVE   Leukocytes, UA NEGATIVE NEGATIVE  Urine culture     Status: None (Preliminary result)   Collection Time: 06/28/15  3:28 PM  Result Value Ref Range   Specimen Description URINE, CLEAN CATCH    Special Requests NONE    Culture      NO GROWTH < 12 HOURS Performed at [facility]    Report Status PENDING   Urine microscopic-add on     Status: Abnormal   Collection Time: 06/28/15  3:28 PM  Result Value Ref Range   Squamous Epithelial / LPF 0-5 (A) NONE SEEN   WBC, UA 0-5 0 - 5 WBC/hpf   RBC / HPF 0-5 0 - 5 RBC/hpf   Bacteria, UA RARE (A) NONE SEEN  POC CBG, ED     Status: Abnormal   Collection Time: 06/28/15  5:26 PM  Result Value Ref Range   Glucose-Capillary 109 (H) 65 - 99 mg/dL  I-Stat CG4 Lactic Acid, ED  (not at  [facility])     Status: None   Collection Time:  06/28/15  5:40 PM  Result Value Ref Range   Lactic Acid, Venous 0.89 0.5 - 2.0 mmol/L  MRSA PCR Screening     Status: None   Collection Time: 06/28/15  6:49 PM  Result Value Ref Range   MRSA by PCR NEGATIVE NEGATIVE    Comment:        The GeneXpert MRSA Assay (FDA approved for NASAL specimens only), is one component of a comprehensive MRSA colonization surveillance program. It is not intended to diagnose MRSA infection nor to guide or monitor treatment for MRSA infections.   Glucose, capillary     Status: Abnormal   Collection Time: 06/28/15  9:10 PM  Result Value Ref Range   Glucose-Capillary 111 (H) 65 - 99 mg/dL   Comment 1 Notify RN   Basic metabolic panel     Status: Abnormal   Collection Time: 06/29/15  4:53 AM  Result Value Ref Range   Sodium 134 (L) 135 - 145 mmol/L   Potassium 4.8 3.5 - 5.1 mmol/L   Chloride 100 (L) 101 - 111 mmol/L   CO2 29 22 - 32 mmol/L   Glucose, Bld 81 65 - 99 mg/dL   BUN 29 (H) 6 - 20 mg/dL   Creatinine, Ser 1.97 (H) 0.61 - 1.24 mg/dL   Calcium 7.9 (L) 8.9 - 10.3 mg/dL   GFR calc non Af Amer 33 (L) >60 mL/min   GFR calc Af Amer 38 (L) >60 mL/min    Comment: (NOTE) The eGFR has been calculated using the CKD EPI equation. This calculation has not been validated in all clinical situations. eGFR's persistently <60 mL/min signify possible Chronic Kidney Disease.    Anion gap 5 5 - 15  CBC     Status: Abnormal   Collection Time: 06/29/15  4:53 AM  Result Value Ref Range   WBC 1.3 (LL) 4.0 - 10.5 K/uL    Comment: CRITICAL RESULT CALLED TO, READ BACK BY AND VERIFIED WITH: WAGONER R. AT [id number] ON [id number] BY THOMPSON S.    RBC 2.83 (L) 4.22 - 5.81 MIL/uL   Hemoglobin 7.7 (L) 13.0 - 17.0 g/dL   HCT 23.4 (L) 39.0 - 52.0 %   MCV 82.7 78.0 - 100.0 fL   MCH 27.2 26.0 - 34.0 pg   MCHC 32.9 30.0 - 36.0 g/dL   RDW 13.5 11.5 - 15.5 %   Platelets 45 (L) 150 - 400 K/uL    Comment: SPECIMEN CHECKED FOR CLOTS PLATELET COUNT CONFIRMED BY SMEAR     Glucose, capillary     Status: Abnormal   Collection Time: 06/29/15  7:33 AM  Result Value Ref Range   Glucose-Capillary 38 (LL) 65 - 99 mg/dL   Comment 1 Notify RN   Glucose, capillary     Status: Abnormal   Collection Time: 06/29/15  7:59 AM  Result Value Ref Range   Glucose-Capillary 41 (LL) 65 - 99 mg/dL  Glucose, capillary     Status: Abnormal   Collection Time: 06/29/15  8:33 AM  Result Value Ref Range   Glucose-Capillary 136 (H) 65 - 99 mg/dL  Vitamin B12     Status: None   Collection Time: 06/29/15 10:32 AM  Result Value Ref Range   Vitamin B-12 450 180 - 914 pg/mL    Comment: (NOTE) This assay is not validated for testing neonatal or myeloproliferative syndrome specimens for Vitamin B12 levels. Performed at [facility]   Glucose, capillary     Status: None   Collection Time: 06/29/15 11:39 AM  Result Value Ref Range   Glucose-Capillary 96 65 - 99 mg/dL  Glucose,  capillary     Status: Abnormal   Collection Time: 06/29/15  4:30 PM  Result Value Ref Range   Glucose-Capillary 197 (H) 65 - 99 mg/dL  CBC with Differential/Platelet     Status: Abnormal   Collection Time: 06/29/15  6:21 PM  Result Value Ref Range   WBC 2.3 (L) 4.0 - 10.5 K/uL   RBC 2.82 (L) 4.22 - 5.81 MIL/uL   Hemoglobin 7.8 (L) 13.0 - 17.0 g/dL   HCT 23.2 (L) 39.0 - 52.0 %   MCV 82.3 78.0 - 100.0 fL   MCH 27.7 26.0 - 34.0 pg   MCHC 33.6 30.0 - 36.0 g/dL   RDW 13.9 11.5 - 15.5 %   Platelets 56 (L) 150 - 400 K/uL    Comment: SPECIMEN CHECKED FOR CLOTS   Neutrophils Relative % 69 %   Neutro Abs 1.6 (L) 1.7 - 7.7 K/uL   Lymphocytes Relative 14 %   Lymphs Abs 0.3 (L) 0.7 - 4.0 K/uL   Monocytes Relative 15 %   Monocytes Absolute 0.3 0.1 - 1.0 K/uL   Eosinophils Relative 2 %   Eosinophils Absolute 0.1 0.0 - 0.7 K/uL   Basophils Relative 0 %   Basophils Absolute 0.0 0.0 - 0.1 K/uL  Type and screen [facility]     Status: None   Collection Time: 06/29/15  8:24 PM  Result Value Ref  Range   ABO/RH(D) O POS    Antibody Screen NEG    Sample Expiration 07/02/2015    Unit Number [id number]    Blood Component Type RBC LR PHER2    Unit division 00    Status of Unit ISSUED,FINAL    Transfusion Status OK TO TRANSFUSE    Crossmatch Result Compatible   Prepare RBC     Status: None   Collection Time: 06/29/15  8:24 PM  Result Value Ref Range   Order Confirmation ORDER PROCESSED BY BLOOD BANK   ABO/Rh     Status: None   Collection Time: 06/29/15  8:24 PM  Result Value Ref Range   ABO/RH(D) O POS   Glucose, capillary     Status: Abnormal   Collection Time: 06/29/15  9:22 PM  Result Value Ref Range   Glucose-Capillary 129 (H) 65 - 99 mg/dL   Comment 1 Notify RN   CBC with Differential/Platelet     Status: Abnormal   Collection Time: 06/30/15  4:19 AM  Result Value Ref Range   WBC 2.9 (L) 4.0 - 10.5 K/uL   RBC 3.19 (L) 4.22 - 5.81 MIL/uL   Hemoglobin 8.8 (L) 13.0 - 17.0 g/dL   HCT 26.1 (L) 39.0 - 52.0 %   MCV 81.8 78.0 - 100.0 fL   MCH 27.6 26.0 - 34.0 pg   MCHC 33.7 30.0 - 36.0 g/dL   RDW 13.8 11.5 - 15.5 %   Platelets 66 (L) 150 - 400 K/uL    Comment: SPECIMEN CHECKED FOR CLOTS PLATELET COUNT CONFIRMED BY SMEAR    Neutrophils Relative % 73 %   Lymphocytes Relative 10 %   Monocytes Relative 15 %   Eosinophils Relative 2 %   Basophils Relative 0 %   Neutro Abs 2.1 1.7 - 7.7 K/uL   Lymphs Abs 0.3 (L) 0.7 - 4.0 K/uL   Monocytes Absolute 0.4 0.1 - 1.0 K/uL   Eosinophils Absolute 0.1 0.0 - 0.7 K/uL   Basophils Absolute 0.0 0.0 - 0.1 K/uL   RBC Morphology POLYCHROMASIA PRESENT    WBC  Morphology INCREASED BANDS (>20% BANDS)     Comment: TOXIC GRANULATION ATYPICAL LYMPHOCYTES   Basic metabolic panel     Status: Abnormal   Collection Time: 06/30/15  4:19 AM  Result Value Ref Range   Sodium 135 135 - 145 mmol/L   Potassium 4.3 3.5 - 5.1 mmol/L   Chloride 102 101 - 111 mmol/L   CO2 27 22 - 32 mmol/L   Glucose, Bld 72 65 - 99 mg/dL   BUN 25 (H) 6 - 20 mg/dL    Creatinine, Ser 1.77 (H) 0.61 - 1.24 mg/dL   Calcium 8.1 (L) 8.9 - 10.3 mg/dL   GFR calc non Af Amer 38 (L) >60 mL/min   GFR calc Af Amer 44 (L) >60 mL/min    Comment: (NOTE) The eGFR has been calculated using the CKD EPI equation. This calculation has not been validated in all clinical situations. eGFR's persistently <60 mL/min signify possible Chronic Kidney Disease.    Anion gap 6 5 - 15  TSH     Status: None   Collection Time: 06/30/15  4:19 AM  Result Value Ref Range   TSH 1.012 0.350 - 4.500 uIU/mL  Glucose, capillary     Status: Abnormal   Collection Time: 06/30/15  8:25 AM  Result Value Ref Range   Glucose-Capillary 37 (LL) 65 - 99 mg/dL   Comment 1 Repeat Test   Glucose, capillary     Status: Abnormal   Collection Time: 06/30/15  8:31 AM  Result Value Ref Range   Glucose-Capillary 50 (L) 65 - 99 mg/dL  Glucose, capillary     Status: None   Collection Time: 06/30/15  9:34 AM  Result Value Ref Range   Glucose-Capillary 69 65 - 99 mg/dL  Glucose, capillary     Status: Abnormal   Collection Time: 06/30/15 11:21 AM  Result Value Ref Range   Glucose-Capillary 189 (H) 65 - 99 mg/dL      RADIOGRAPHY: No results found.     PATHOLOGY:  Nothing new  ASSESSMENT/PLAN:  DLBCL, stage II Prior history of follicular lymphoma Febrile Neutropenia COPD on chronic O2 Neuropathy  Febrile Neutropenia with temperature of 100.2 F in ED in the setting of hypotention.  Afebrile since that time.  Neutropenic work-up is complete without any known source of fever at this time.  Neutropenia has resolved as expected with Neulasta on 06/21/2015. Now with a wet cough, not able to bring up sputum secondary to neutrophil recovery. Will order a repeat chest xray.   On appropriate antibiotics.   DLBCL- on systemic chemotherapy with R- mini -CHOP, currently Day 11 of cycle #2 with Neulasta support.  Reports progressive neuropathy, but this may be Neulasta-induced pain given his description of a  "knife in the leg." But will have to be closely monitored and further evaluated prior to his next cycle.   Influenza panel is pending at this time.  Will need outpatient follow-up at [facility] following discharge.  He is set-up for repeat ECHO next week.  Will probably defer treatment x 1 week as well and set-up for restaging imaging during that week off from therapy.  Scheduled follow-up on 07/11/2015.    All questions were answered. The patient knows to call the clinic with any problems, questions or concerns. We can certainly see the patient much sooner if necessary.  Patient and plan discussed with Dr. Ancil Linsey and she is in agreement with the aforementioned.   This note is electronically signed by:  KEFALAS,THOMAS, PA-C 06/30/2015 3:26 PM    As above.  Patient looks good today and is feeling better. He is on R- mini CHOP. May need deferral of next cycle, not ideal but will reassess once outpatient.  Symptomatically he is improved as at time of progression he had developed significant abdominal pain, this has nicely resolved.  Repeat CXR. Donald Pore MD

## 2015-06-30 NOTE — Progress Notes (Addendum)
[facility] PROGRESS NOTE  Billy Gray [id number] DOB: 1946-03-01 DOA: 06/28/2015 PCP: Pcp Not In System    Code Status: Full code Family Communication: Discussed with wife Disposition Plan: Discharge when clinically appropriate, possibly in the next day or 2.   Consultants:  Oncology intact  Procedures:  None  Antibiotics:  Vancomycin 06/28/15>> 06/30/15  Cefepime 06/28/15>>  HPI/Subjective: Patient feels better. He had another episode of low blood sugar this morning which was asymptomatic. He was treated appropriately and his blood glucose improved.  Objective: Filed Vitals:   06/30/15 0900 06/30/15 1000  BP:  119/61  Pulse: 40 102  Temp:    Resp: 19 25  T 97. Oxygen saturation 97% on nasal cannula oxygen.  Intake/Output Summary (Last 24 hours) at 06/30/15 1126 Last data filed at 06/30/15 1100  Gross per 24 hour  Intake 3373.5 ml  Output   1250 ml  Net 2123.5 ml   Filed Weights   06/28/15 1408 06/28/15 1918 06/29/15 0500  Weight: 94.802 kg (209 lb) 99.2 kg (218 lb 11.1 oz) 100.6 kg (221 lb 12.5 oz)    Exam:   General:  Obese 70 year old man sitting up in bed, in no acute distress. He looks much better this morning.  Cardiovascular: S1, S2, with a soft systolic murmur, borderline tachycardia and occasional ectopic beat.  Respiratory: Decreased breath sound at bases, otherwise clear.  Abdomen: Obese, positive bowel sounds, soft, nontender, nondistended.  Musculoskeletal/extremities: Trace to 1+ bilateral pedal edema.   Neurologic: He is alert and oriented 3. His speech is clear.  Data Reviewed: Basic Metabolic Panel:  Recent Labs Lab 06/28/15 1430 06/29/15 0453 06/30/15 0419  NA 133* 134* 135  K 4.5 4.8 4.3  CL 97* 100* 102  CO2 31 29 27   GLUCOSE 47* 81 72  BUN 31* 29* 25*  CREATININE 1.90* 1.97* 1.77*  CALCIUM 8.5* 7.9* 8.1*   Liver Function Tests:  Recent Labs Lab 06/28/15 1430  AST 13*  ALT 15*  ALKPHOS 70  BILITOT 0.8   PROT 4.7*  ALBUMIN 2.3*   No results for input(s): LIPASE, AMYLASE in the last 168 hours. No results for input(s): AMMONIA in the last 168 hours. CBC:  Recent Labs Lab 06/28/15 1430 06/29/15 0453 06/29/15 1821 06/30/15 0419  WBC 2.1* 1.3* 2.3* 2.9*  NEUTROABS 0.5*  --  1.6* 2.1  HGB 8.7* 7.7* 7.8* 8.8*  HCT 25.5* 23.4* 23.2* 26.1*  MCV 80.7 82.7 82.3 81.8  PLT 42* 45* 56* 66*   Cardiac Enzymes: No results for input(s): CKTOTAL, CKMB, CKMBINDEX, TROPONINI in the last 168 hours. BNP (last 3 results)  Recent Labs  12/03/14 1631 03/20/15 0545  BNP 102.8* 74.0    ProBNP (last 3 results) No results for input(s): PROBNP in the last 8760 hours.  CBG:  Recent Labs Lab 06/29/15 2122 06/30/15 0825 06/30/15 0831 06/30/15 0934 06/30/15 1121  GLUCAP 129* 37* 50* 69 189*    Recent Results (from the past 240 hour(s))  Blood Culture (routine x 2)     Status: None (Preliminary result)   Collection Time: 06/28/15  2:30 PM  Result Value Ref Range Status   Specimen Description BLOOD PICC  Final   Special Requests BOTTLES DRAWN AEROBIC AND ANAEROBIC 5CC EACH  Final   Culture NO GROWTH 2 DAYS  Final   Report Status PENDING  Incomplete  Blood Culture (routine x 2)     Status: None (Preliminary result)   Collection Time: 06/28/15  2:37 PM  Result Value  Ref Range Status   Specimen Description RIGHT ANTECUBITAL  Final   Special Requests BOTTLES DRAWN AEROBIC AND ANAEROBIC [facility] EACH  Final   Culture PENDING  Incomplete   Report Status PENDING  Incomplete  Urine culture     Status: None (Preliminary result)   Collection Time: 06/28/15  3:28 PM  Result Value Ref Range Status   Specimen Description URINE, CLEAN CATCH  Final   Special Requests NONE  Final   Culture   Final    NO GROWTH < 12 HOURS Performed at [facility]    Report Status PENDING  Incomplete  MRSA PCR Screening     Status: None   Collection Time: 06/28/15  6:49 PM  Result Value Ref Range Status    MRSA by PCR NEGATIVE NEGATIVE Final    Comment:        The GeneXpert MRSA Assay (FDA approved for NASAL specimens only), is one component of a comprehensive MRSA colonization surveillance program. It is not intended to diagnose MRSA infection nor to guide or monitor treatment for MRSA infections.      Studies: Dg Chest Port 1 View  06/28/2015  CLINICAL DATA:  70 year old male with cough, congestion and shortness of breath EXAM: PORTABLE CHEST 1 VIEW COMPARISON:  Prior chest x-ray 12/10/2014 FINDINGS: Right IJ approach single-lumen power injectable port catheter. Catheter tip in good position overlying the lower SVC. Cardiac and mediastinal contours are unchanged. Atherosclerotic calcifications in the transverse aorta. Borderline cardiomegaly similar compared to prior. The lungs are clear. No pulmonary edema, focal airspace consolidation, pleural effusion or pneumothorax. No acute osseous abnormality. IMPRESSION: No active disease. Electronically Signed   By: Jacqulynn Cadet M.D.   On: 06/28/2015 14:41    Scheduled Meds: . sodium chloride   Intravenous Once  . allopurinol  150 mg Oral Daily  . budesonide-formoterol  2 puff Inhalation BID  . ceFEPime (MAXIPIME) IV  2 g Intravenous Q12H  . gabapentin  400 mg Oral BID  . guaiFENesin  600 mg Oral BID  . pravastatin  40 mg Oral QPM  . sodium chloride  1,000 mL Intravenous Once  . sodium chloride  3 mL Intravenous Q12H  . tamsulosin  0.4 mg Oral QHS  . tiotropium  18 mcg Inhalation Daily  . vancomycin  1,250 mg Intravenous Q24H   Continuous Infusions: . dextrose 5 % and 0.9% NaCl 70 mL/hr at 06/30/15 0945   Assessment and plan:  Principal Problem:   Neutropenic fever (HCC) Active Problems:   SIRS (systemic inflammatory response syndrome) (HCC)   Chronic respiratory failure with hypoxia (HCC)   Pancytopenia (HCC)   COPD (chronic obstructive pulmonary disease) (HCC)   Acute on chronic kidney failure (HCC)   Essential  hypertension   Diabetes mellitus with nephropathy (HCC)   DLBCL (diffuse large B cell lymphoma) (HCC)   Hypoglycemia due to insulin  1. Neutropenic fever associated with SIRS, associated with generalized malaise. Patient urinalysis and chest x-ray results were not obviously consistent with infection. The patient may have some sort of upper respiratory infection, but in the setting of neutropenic fever, the start of broad-spectrum antibiotics cefepime and vancomycin was reasonable. He also may be a little volume depleted/dehydrated given the increase in his BUN and creatinine above baseline. -Blood cultures ordered and are pending. Respiratory panel ordered and is pending. -Water influenza panel since the results are quicker. TSH ordered and was within normal limits. Vitamin B12 level pending. -We'll continue IV cefepime, but discontinue vancomycin as  he is MRSA screen was negative. Consider narrowing antibiotic further in the next 24 hours. -We'll order PT evaluation.  Pancytopenia, with anemia, thrombocytopenia, and leukopenia.  -The pancytopenia thought to be secondary to recent chemotherapy. He undergoes chemotherapy for non-Hodgkin's lymphoma. He is followed by Dr. Whitney Muse. His WBC was within normal limits at 5.2 one week ago. It was 2.1 on admission and had fallen to 1.3. His ANC is 0.5. His hemoglobin had fallen from 10.8 one week ago to 8.7 on admission to 7.7 on 06/29/15. His platelet count had fallen from 267 one week ago to 42. -In reviewing his chart, it appears that he received Neulasta on 06/20/15. -He was transfuse 1 unit of packed red blood cells on 06/29/15. His hemoglobin has improved appropriately. -His blood counts have improved. - Dr. Whitney Muse has been consulted for further recommendations.  Type 2 diabetes with nephropathy. Hypoglycemia due to insulin. The patient is treated with Lantus and Humalog at home. Lantus and NovoLog were started. He has had a couple of a.m. a  symptomatic hypoglycemic episodes. The patient was treated with D50 with improvement. -We'll discontinue Lantus and sliding scale NovoLog. Will add dextrose to the IV fluids. Will restart sliding scale NovoLog for a CBG consistently above 150. -We'll continue gabapentin.  Chronic hypoxic respiratory failure secondary to COPD. This appears to be compensated. He has no wheezes on exam. We'll continue Symbicort, Spiriva, and Mucinex.  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 Patient's baseline creatinine per chart review over the past few weeks, is 1.6-1.8. It was 1.9 on admission. -Patient was started on IV fluids. This will be continued. His renal function will continue to be monitored. Likely etiology is prerenal azotemia secondary to poor oral intake.  Essential hypertension. The patient is treated chronically with amlodipine. It is currently on hold secondary to low-normal blood pressures.  Chronic pedal edema. Patient says swelling in his feet are chronic. 2-D echocardiogram from December 2016 was reviewed and apparently the patient has an EF of 60% and grade 1 diastolic dysfunction. I cannot say with certainty that his swelling is from diastolic heart failure. He is on amlodipine which can cause pedal edema. -We'll keep his legs elevated and consider as needed Lasix.  Intermittent tachycardia with PVCs. Will order EKG for further evaluation. His TSH was within normal limits.     Time spent: 30 minutes     [facility] Pager: [phone] If 7PM-7AM, please contact night-coverage at [email or web address], password [email or web address] 06/30/2015, 11:26 AM  LOS: 2 days

## 2015-07-01 DIAGNOSIS — J189 Pneumonia, unspecified organism: Secondary | ICD-10-CM | POA: Diagnosis present

## 2015-07-01 LAB — BASIC METABOLIC PANEL
ANION GAP: 5 (ref 5–15)
BUN: 19 mg/dL (ref 6–20)
CHLORIDE: 103 mmol/L (ref 101–111)
CO2: 29 mmol/L (ref 22–32)
Calcium: 8.6 mg/dL — ABNORMAL LOW (ref 8.9–10.3)
Creatinine, Ser: 1.7 mg/dL — ABNORMAL HIGH (ref 0.61–1.24)
GFR, EST AFRICAN AMERICAN: 46 mL/min — AB (ref >60)
GFR, EST NON AFRICAN AMERICAN: 40 mL/min — AB (ref >60)
Glucose, Bld: 170 mg/dL — ABNORMAL HIGH (ref 65–99)
POTASSIUM: 4.6 mmol/L (ref 3.5–5.1)
SODIUM: 137 mmol/L (ref 135–145)

## 2015-07-01 LAB — GLUCOSE, CAPILLARY
GLUCOSE-CAPILLARY: 201 mg/dL — AB (ref 65–99)
Glucose-Capillary: 157 mg/dL — ABNORMAL HIGH (ref 65–99)

## 2015-07-01 LAB — RESPIRATORY VIRUS PANEL
ADENOVIRUS: NEGATIVE
Influenza A: NEGATIVE
Influenza B: NEGATIVE
Metapneumovirus: NEGATIVE
Parainfluenza 1: NEGATIVE
Parainfluenza 2: NEGATIVE
Parainfluenza 3: NEGATIVE
RESPIRATORY SYNCYTIAL VIRUS A: NEGATIVE
RESPIRATORY SYNCYTIAL VIRUS B: NEGATIVE
RHINOVIRUS: NEGATIVE

## 2015-07-01 LAB — CBC WITH DIFFERENTIAL/PLATELET
BASOS ABS: 0 10*3/uL (ref 0.0–0.1)
Basophils Relative: 0 %
EOS ABS: 0.1 10*3/uL (ref 0.0–0.7)
Eosinophils Relative: 2 %
HCT: 27.4 % — ABNORMAL LOW (ref 39.0–52.0)
HEMOGLOBIN: 9.3 g/dL — AB (ref 13.0–17.0)
LYMPHS PCT: 8 %
Lymphs Abs: 0.3 10*3/uL — ABNORMAL LOW (ref 0.7–4.0)
MCH: 27.8 pg (ref 26.0–34.0)
MCHC: 33.9 g/dL (ref 30.0–36.0)
MCV: 81.8 fL (ref 78.0–100.0)
MONO ABS: 0.5 10*3/uL (ref 0.1–1.0)
Monocytes Relative: 14 %
NEUTROS PCT: 76 %
Neutro Abs: 2.9 10*3/uL (ref 1.7–7.7)
PLATELETS: 94 10*3/uL — AB (ref 150–400)
RBC: 3.35 MIL/uL — AB (ref 4.22–5.81)
RDW: 14 % (ref 11.5–15.5)
WBC: 3.8 10*3/uL — AB (ref 4.0–10.5)

## 2015-07-01 LAB — HEMOGLOBIN A1C
Hgb A1c MFr Bld: 8 % — ABNORMAL HIGH (ref 4.8–5.6)
MEAN PLASMA GLUCOSE: 183 mg/dL

## 2015-07-01 MED ORDER — INSULIN GLARGINE 100 UNIT/ML ~~LOC~~ SOLN: 35.0000 [IU] | Freq: Every day | SUBCUTANEOUS | Status: AC

## 2015-07-01 MED ORDER — FUROSEMIDE 20 MG PO TABS
20.0000 mg | ORAL_TABLET | Freq: Once | ORAL | Status: AC
  Administered 2015-07-01: 20 mg via ORAL
  Filled 2015-07-01: qty 1

## 2015-07-01 MED ORDER — CEFUROXIME AXETIL 500 MG PO TABS
500.0000 mg | ORAL_TABLET | Freq: Two times a day (BID) | ORAL | Status: DC
Start: 2015-07-01 — End: 2015-07-11

## 2015-07-01 MED ORDER — FUROSEMIDE 20 MG PO TABS: 20.0000 mg | ORAL_TABLET | Freq: Every day | ORAL | Status: AC

## 2015-07-01 MED ORDER — HEPARIN SOD (PORK) LOCK FLUSH 100 UNIT/ML IV SOLN
500.0000 [IU] | INTRAVENOUS | Status: DC | PRN
  Filled 2015-07-01: qty 5

## 2015-07-01 MED ORDER — INSULIN GLARGINE 100 UNIT/ML ~~LOC~~ SOLN
40.0000 [IU] | Freq: Every day | SUBCUTANEOUS | Status: DC
Start: 2015-07-01 — End: 2015-07-01

## 2015-07-01 MED ORDER — GUAIFENESIN ER 600 MG PO TB12: 600.0000 mg | ORAL_TABLET | Freq: Two times a day (BID) | ORAL | Status: AC

## 2015-07-01 NOTE — Care Management Note (Signed)
Case Management Note  Patient Details  Name: Billy Gray MRN: [id number] Date of Birth: Nov 03, 1945  Subjective/Objective:                    Action/Plan:   Expected Discharge Date:                  Expected Discharge Plan:  Home/Self Care  In-House Referral:  NA  Discharge planning Services  CM Consult  Post Acute Care Choice:  NA Choice offered to:  NA  DME Arranged:    DME Agency:     HH Arranged:    HH Agency:     Status of Service:  Completed, signed off  Medicare Important Message Given:  Yes Date Medicare IM Given:    Medicare IM give by:    Date Additional Medicare IM Given:    Additional Medicare Important Message give by:     If discussed at [place] of Stay Meetings, dates discussed:    Additional Comments: Pt discharged home today. No CM needs noted. Christinia Gully [place], RN 07/01/2015, 10:58 AM

## 2015-07-01 NOTE — Care Management Important Message (Signed)
Important Message  Patient Details  Name: Billy Gray MRN: [id number] Date of Birth: 27-Oct-1945   Medicare Important Message Given:  Yes    Joylene Draft, RN 07/01/2015, 10:58 AM

## 2015-07-01 NOTE — Progress Notes (Signed)
Pt is discharging from facility via w/c in stable condition, with spouse and son at his side.  All f/u appts were reviewed with patient.  He voices understanding.  Right side chest port remains intact.  All questions/concerns addressed. Patient is d/c'd in stable condition.

## 2015-07-01 NOTE — Discharge Summary (Signed)
Physician Discharge Summary  Billy Gray [id number] DOB: October 13, 1945 DOA: 06/28/2015  PCP: Pcp Not In System  Admit date: 06/28/2015 Discharge date: 07/01/2015  Time spent: Greater than 30 minutes  Recommendations for Outpatient Follow-up:  1. Recommend follow-up of the patient's CBC. 2. Recommend follow-up of his serum potassium as he was given a prescription for Lasix 2 days.     Discharge Diagnoses:   1. Neutropenic fever. 2. Possible community-acquired pneumonia. 3. Systemic inflammatory response syndrome; not clinically sepsis. 4. Pancytopenia, chemotherapy-induced. 5. 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 6. Acute kidney injury superimposed on stage III-4 chronic kidney disease. 7. Essential hypertension. 8. Diabetes with nephropathy. 9. Hypoglycemia secondary to insulin. 10. Morbid obesity. 11. Non-Hodgkin's lymphoma, on chemotherapy  Discharge Condition: Improved.  Diet recommendation: Carbohydrate modified/heart healthy.  Filed Weights   06/28/15 1408 06/28/15 1918 06/29/15 0500  Weight: 94.802 kg (209 lb) 99.2 kg (218 lb 11.1 oz) 100.6 kg (221 lb 12.5 oz)    History of present illness:  The patient is a 70 year old man with non-Hodgkin's lymphoma, diabetes mellitus, oxygen dependent COPD, and chronic disease, who presented to the ED on 06/28/15 with a chief complaint of generalized malaise. In the ED, he was febrile with a temperature 100.2. He was hemodynamically stable, but his blood pressure was on the lower end of normal. His white blood cell count was 2.1. His chest x-ray revealed no active disease. He was admitted for further evaluation and management.  Hospital Course:  1. Neutropenic fever associated with SIRS, associated with generalized malaise. Possible pneumonia that fluffed out following hydration. Patient urinalysis and chest x-ray results were not obviously consistent with infection on admission. However,  given his fever and immunocompromised state from his lymphoma, he was started on a spectrum antibiotics with cefepime and vancomycin. Gentle IV fluids were started for mild volume depletion/dehydration. Blood cultures were ordered as well as an influenza panel. The blood cultures have been negative to date. Influenza panel was negative. A respiratory panel was also ordered, but was pending at the time of discharge. -Follow-up chest x-ray following IV fluid hydration revealed bibasilar opacities which could be construed as bibasilar pneumonia. The patient's white blood cell count improved. He became and remained afebrile throughout the hospitalization. -He was discharged the patient on 5 more days of Ceftin for presumed CAP.  Pancytopenia, with anemia, thrombocytopenia, and leukopenia.  -The pancytopenia thought to be secondary to recent chemotherapy. He undergoes chemotherapy for non-Hodgkin's lymphoma. He is followed by Dr. Whitney Muse. His WBC was within normal limits at 5.2 one week ago. It was 2.1 on admission and had fallen to 1.3. His ANC was 0.5. His hemoglobin had fallen from 10.8 one week ago to 8.7 on admission to 7.7 during hospitalization.  His platelet count had fallen from 267 one week ago to 42. -In reviewing his chart, it appears that he received Neulasta on 06/20/15. -He was transfuse 1 unit of packed red blood cells on 06/29/15. His hemoglobin has improved appropriately. -His blood counts have improved progressively. - Dr. Whitney Muse has been consulted and agreed with medical management. She plans to defer treatment for 1 week. Chemotherapy will resume on 07/11/2015. Outpatient follow-up echo is also pending.  Type 2 diabetes with nephropathy. Hypoglycemia due to insulin. The patient is treated with Lantus and Humalog at home. Lantus and NovoLog were started. He has had a couple of a.m. a symptomatic hypoglycemic episodes. The patient was treated with D50 with improvement. -We'll  discontinue Lantus and sliding scale  NovoLog. Will add dextrose to the IV fluids. Will restart sliding scale NovoLog for a CBG consistently above 150. -We'll continue gabapentin.  Chronic hypoxic respiratory failure secondary to COPD. This appears to be compensated. He had no wheezes on exam. He was continued on Symbicort, Spiriva, and Mucinex.   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 Patient's baseline creatinine per chart review over the past few weeks, is 1.6-1.8. It was 1.9 on admission. -Patient was started on IV fluids. Likely etiology is prerenal azotemia secondary to poor oral intake.  Essential hypertension. The patient is treated chronically with amlodipine. It is currently on hold secondary to low-normal blood pressures.  Chronic pedal edema. Patient says swelling in his feet is chronic. 2-D echocardiogram from December 2016 was reviewed and apparently the patient has an EF of 60% and grade 1 diastolic dysfunction. I cannot say with certainty that his swelling is from diastolic heart failure. He is on amlodipine which can cause pedal edema. -Patient requested something for his edema, so Lasix was given 1. He was also given a prescription for Lasix to take daily for 2 more days.   Intermittent tachycardia with PVCs. His EKG revealed junctional rhythm with a heart rate of 10 9 bpm. His TSH was within normal limits. He had no complaints of chest pain or palpitations. Would recommend follow-up echo as previously ordered. Consider cardiology evaluation if he becomes symptomatic.   Procedures:  None  Consultations:  Oncology  Discharge Exam: Filed Vitals:   06/30/15 2202 07/01/15 0633  BP: 138/62 129/60  Pulse: 101 99  Temp: 97.8 F (36.6 C) 98.6 F (37 C)  Resp: 20 20   oxygen saturation on nasal cannula oxygen, 98%.   General: Obese 70 year old man sitting up in bed, in no acute distress.  Cardiovascular: S1, S2, with a soft systolic  murmur  Respiratory: Decreased breath sound at bases, otherwise clear.  Abdomen: Obese, positive bowel sounds, soft, nontender, nondistended.  Musculoskeletal/extremities: Trace to 1+ bilateral pedal edema.   Neurologic: He is alert and oriented 3. His speech is clear.  Discharge Instructions   Discharge Instructions    Diet - low sodium heart healthy    Complete by:  As directed      Diet - low sodium heart healthy    Complete by:  As directed      Diet Carb Modified    Complete by:  As directed      Diet general    Complete by:  As directed      Discharge instructions    Complete by:  As directed   Dose of Lantus was increased to 40 units.     Discharge instructions    Complete by:  As directed   Continue Lantus and sliding scale NovoLog as previously prescribed.     Increase activity slowly    Complete by:  As directed      Increase activity slowly    Complete by:  As directed           Current Discharge Medication List    START taking these medications   Details  cefUROXime (CEFTIN) 500 MG tablet Take 1 tablet (500 mg total) by mouth 2 (two) times daily with a meal. Antibiotics be taken for 5 more days, starting tomorrow. Qty: 10 tablet, Refills: 0    furosemide (LASIX) 20 MG tablet Take 1 tablet (20 mg total) by mouth daily. Take daily for 2 more days for fluid retention. Qty: 2  tablet, Refills: 0    guaiFENesin (MUCINEX) 600 MG 12 hr tablet Take 1 tablet (600 mg total) by mouth 2 (two) times daily.      CONTINUE these medications which have CHANGED   Details  insulin glargine (LANTUS) 100 UNIT/ML injection Inject 0.35 mLs (35 Units total) into the skin at bedtime.      CONTINUE these medications which have NOT CHANGED   Details  acetaminophen (TYLENOL) 500 MG tablet Take 500 mg by mouth every 6 (six) hours as needed for moderate pain.    albuterol (PROVENTIL HFA;VENTOLIN HFA) 108 (90 BASE) MCG/ACT inhaler Inhale 2 puffs into the lungs every 6 (six)  hours as needed for wheezing or shortness of breath.    allopurinol (ZYLOPRIM) 300 MG tablet Take 150 mg by mouth daily.   Associated Diagnoses: DLBCL (diffuse large B cell lymphoma) (HCC)    amLODipine (NORVASC) 5 MG tablet Take 1 tablet (5 mg total) by mouth daily. Qty: 30 tablet, Refills: 4   Associated Diagnoses: Essential hypertension    budesonide-formoterol (SYMBICORT) 160-4.5 MCG/ACT inhaler Inhale 2 puffs into the lungs 2 (two) times daily.     cholecalciferol (VITAMIN D) 1000 UNITS tablet Take 1,000 Units by mouth daily.    citalopram (CELEXA) 40 MG tablet Take 40 mg by mouth daily.    Cyclophosphamide (CYTOXAN IJ) Inject as directed every 21 ( twenty-one) days. To begin 05/30/15    DOXOrubicin HCl (ADRIAMYCIN IV) Inject into the vein every 21 ( twenty-one) days. To begin 05/30/15    gabapentin (NEURONTIN) 800 MG tablet Take 400 mg by mouth 2 (two) times daily.    HYDROcodone-acetaminophen (NORCO) 10-325 MG tablet May take 1/2 tab or 1 tab every 6 hours prn pain. Qty: 60 tablet, Refills: 0   Associated Diagnoses: DLBCL (diffuse large B cell lymphoma) (HCC)    insulin lispro (HUMALOG) 100 UNIT/ML injection Inject into the skin. Check blood sugar three times a day and administer Humalog insulin as ordered: 151-200 = 2 units,   201-250 = 4 units,      251-300 = 6 units,   Greater than or equal to 301 = 8 units and call MD    lidocaine-prilocaine (EMLA) cream Apply a quarter size amount to port site 1 hour prior to chemo. Do not rub in. Cover with plastic wrap. Qty: 30 g, Refills: 3   Associated Diagnoses: DLBCL (diffuse large B cell lymphoma) (HCC)    ondansetron (ZOFRAN) 8 MG tablet Take 1 tablet (8 mg total) by mouth every 8 (eight) hours as needed for nausea or vomiting. Qty: 30 tablet, Refills: 2   Associated Diagnoses: DLBCL (diffuse large B cell lymphoma) (HCC)    OXYGEN Inhale 3 L into the lungs continuous.    Pegfilgrastim (NEULASTA ONPRO [place]) Inject into the skin  every 21 ( twenty-one) days. To be applied after the completion of chemo    pravastatin (PRAVACHOL) 40 MG tablet Take 40 mg by mouth every evening.    predniSONE (DELTASONE) 20 MG tablet On Days 1-5 of chemo take 3 tabs (60mg  total). Qty: 45 tablet, Refills: 1   Associated Diagnoses: DLBCL (diffuse large B cell lymphoma) (HCC)    RiTUXimab (RITUXAN IV) Inject into the vein every 21 ( twenty-one) days. To begin 05/30/15    tamsulosin (FLOMAX) 0.4 MG CAPS capsule Take 0.4 mg by mouth at bedtime.    tiotropium (SPIRIVA) 18 MCG inhalation capsule Place 18 mcg into inhaler and inhale daily. 2 puffs daily    VINCRISTINE  SULFATE IV Inject into the vein every 21 ( twenty-one) days. To begin 05/30/15    zolpidem (AMBIEN) 10 MG tablet Take 10 mg by mouth at bedtime as needed for sleep.        No Known Allergies Follow-up Information    Follow up with Molli Hazard, MD On 07/11/2015.   Specialties:  Hematology and Oncology, Oncology   Why:  For chemo and to see Dr. Whitney Muse at 8:30 am   Contact information:   [place] [id number] [phone]        The results of significant diagnostics from this hospitalization (including imaging, microbiology, ancillary and laboratory) are listed below for reference.    Significant Diagnostic Studies: Dg Chest 2 View  06/30/2015  CLINICAL DATA:  70 year old male with intermittently productive cough for 1 week with fever for the past 2 days. History of non-Hodgkin's lymphoma. Former smoker. EXAM: CHEST  2 VIEW COMPARISON:  Chest x-ray 06/28/2015. FINDINGS: Right internal jugular power porta cath with tip terminating in the distal superior vena cava. Lung volumes are normal. Bibasilar opacities favored to reflect small bilateral pleural effusions with associated areas of passive atelectasis in the lower lobes of the lungs bilaterally. Underlying airspace consolidation is not excluded, particularly given the patient's symptoms. No  evidence of pulmonary edema. Heart size is normal. The patient is rotated to the left on today's exam, resulting in distortion of the mediastinal contours and reduced diagnostic sensitivity and specificity for mediastinal pathology. Atherosclerosis in the thoracic aorta. IMPRESSION: 1. Bibasilar opacities which are favored to reflect small bilateral pleural effusions with associated passive atelectasis in the lower lobes of the lungs bilaterally, however, given the patient's history of intermittent productive cough and fever, underlying airspace consolidation from developing infection or sequela of recent aspiration is not excluded. 2. Atherosclerosis. Electronically Signed   By: Vinnie Langton M.D.   On: 06/30/2015 17:49   Dg Chest Port 1 View  06/28/2015  CLINICAL DATA:  70 year old male with cough, congestion and shortness of breath EXAM: PORTABLE CHEST 1 VIEW COMPARISON:  Prior chest x-ray 12/10/2014 FINDINGS: Right IJ approach single-lumen power injectable port catheter. Catheter tip in good position overlying the lower SVC. Cardiac and mediastinal contours are unchanged. Atherosclerotic calcifications in the transverse aorta. Borderline cardiomegaly similar compared to prior. The lungs are clear. No pulmonary edema, focal airspace consolidation, pleural effusion or pneumothorax. No acute osseous abnormality. IMPRESSION: No active disease. Electronically Signed   By: Jacqulynn Cadet M.D.   On: 06/28/2015 14:41    Microbiology: Recent Results (from the past 240 hour(s))  Blood Culture (routine x 2)     Status: None (Preliminary result)   Collection Time: 06/28/15  2:30 PM  Result Value Ref Range Status   Specimen Description BLOOD PICC  Final   Special Requests BOTTLES DRAWN AEROBIC AND ANAEROBIC 5CC EACH  Final   Culture NO GROWTH 2 DAYS  Final   Report Status PENDING  Incomplete  Blood Culture (routine x 2)     Status: None (Preliminary result)   Collection Time: 06/28/15  2:37 PM  Result  Value Ref Range Status   Specimen Description RIGHT ANTECUBITAL  Final   Special Requests BOTTLES DRAWN AEROBIC AND ANAEROBIC [place]  Final   Culture PENDING  Incomplete   Report Status PENDING  Incomplete  Urine culture     Status: None   Collection Time: 06/28/15  3:28 PM  Result Value Ref Range Status   Specimen Description URINE, CLEAN  CATCH  Final   Special Requests NONE  Final   Culture   Final    1,000 COLONIES/mL INSIGNIFICANT GROWTH Performed at [facility]    Report Status 06/30/2015 FINAL  Final  MRSA PCR Screening     Status: None   Collection Time: 06/28/15  6:49 PM  Result Value Ref Range Status   MRSA by PCR NEGATIVE NEGATIVE Final    Comment:        The GeneXpert MRSA Assay (FDA approved for NASAL specimens only), is one component of a comprehensive MRSA colonization surveillance program. It is not intended to diagnose MRSA infection nor to guide or monitor treatment for MRSA infections.      Labs: Basic Metabolic Panel:  Recent Labs Lab 06/28/15 1430 06/29/15 0453 06/30/15 0419 07/01/15 0615  NA 133* 134* 135 137  K 4.5 4.8 4.3 4.6  CL 97* 100* 102 103  CO2 31 29 27 29   GLUCOSE 47* 81 72 170*  BUN 31* 29* 25* 19  CREATININE 1.90* 1.97* 1.77* 1.70*  CALCIUM 8.5* 7.9* 8.1* 8.6*   Liver Function Tests:  Recent Labs Lab 06/28/15 1430  AST 13*  ALT 15*  ALKPHOS 70  BILITOT 0.8  PROT 4.7*  ALBUMIN 2.3*   No results for input(s): LIPASE, AMYLASE in the last 168 hours. No results for input(s): AMMONIA in the last 168 hours. CBC:  Recent Labs Lab 06/28/15 1430 06/29/15 0453 06/29/15 1821 06/30/15 0419 07/01/15 0615  WBC 2.1* 1.3* 2.3* 2.9* 3.8*  NEUTROABS 0.5*  --  1.6* 2.1 2.9  HGB 8.7* 7.7* 7.8* 8.8* 9.3*  HCT 25.5* 23.4* 23.2* 26.1* 27.4*  MCV 80.7 82.7 82.3 81.8 81.8  PLT 42* 45* 56* 66* 94*   Cardiac Enzymes: No results for input(s): CKTOTAL, CKMB, CKMBINDEX, TROPONINI in the last 168 hours. BNP: BNP (last 3  results)  Recent Labs  12/03/14 1631 03/20/15 0545  BNP 102.8* 74.0    ProBNP (last 3 results) No results for input(s): PROBNP in the last 8760 hours.  CBG:  Recent Labs Lab 06/30/15 0934 06/30/15 1121 06/30/15 1724 06/30/15 2041 07/01/15 0728  GLUCAP 69 189* 125* 179* 157*       Signed:  Altonio Schwertner MD.  [facility] 07/01/2015, 10:17 AM

## 2015-07-04 LAB — CULTURE, BLOOD (ROUTINE X 2): CULTURE: NO GROWTH

## 2015-07-08 ENCOUNTER — Ambulatory Visit (HOSPITAL_COMMUNITY)
Admission: RE | Admit: 2015-07-08 | Discharge: 2015-07-08 | Disposition: A | Discharge status: Home / Self Care | Payer: Medicare Other | Source: Ambulatory Visit | Attending: Hematology & Oncology | Admitting: Hematology & Oncology

## 2015-07-08 DIAGNOSIS — E119 Type 2 diabetes mellitus without complications: Secondary | ICD-10-CM | POA: Diagnosis not present

## 2015-07-08 DIAGNOSIS — Z08 Encounter for follow-up examination after completed treatment for malignant neoplasm: Secondary | ICD-10-CM | POA: Diagnosis present

## 2015-07-08 DIAGNOSIS — C8330 Diffuse large B-cell lymphoma, unspecified site: Secondary | ICD-10-CM | POA: Diagnosis not present

## 2015-07-08 DIAGNOSIS — J449 Chronic obstructive pulmonary disease, unspecified: Secondary | ICD-10-CM | POA: Insufficient documentation

## 2015-07-08 DIAGNOSIS — I071 Rheumatic tricuspid insufficiency: Secondary | ICD-10-CM | POA: Diagnosis not present

## 2015-07-08 DIAGNOSIS — Z9221 Personal history of antineoplastic chemotherapy: Secondary | ICD-10-CM | POA: Diagnosis not present

## 2015-07-08 DIAGNOSIS — I10 Essential (primary) hypertension: Secondary | ICD-10-CM | POA: Insufficient documentation

## 2015-07-08 LAB — CULTURE, BLOOD (ROUTINE X 2): CULTURE: NO GROWTH

## 2015-07-11 ENCOUNTER — Encounter (HOSPITAL_BASED_OUTPATIENT_CLINIC_OR_DEPARTMENT_OTHER): Payer: Medicare Other | Admitting: Hematology & Oncology

## 2015-07-11 ENCOUNTER — Encounter (HOSPITAL_COMMUNITY): Payer: Medicare Other | Attending: Hematology & Oncology

## 2015-07-11 ENCOUNTER — Encounter (HOSPITAL_COMMUNITY): Payer: Self-pay | Admitting: Hematology & Oncology

## 2015-07-11 VITALS — BP 136/73 | HR 94 | Temp 99.1°F | Resp 20 | Wt 212.8 lb

## 2015-07-11 DIAGNOSIS — N189 Chronic kidney disease, unspecified: Secondary | ICD-10-CM | POA: Diagnosis present

## 2015-07-11 DIAGNOSIS — C8333 Diffuse large B-cell lymphoma, intra-abdominal lymph nodes: Secondary | ICD-10-CM

## 2015-07-11 DIAGNOSIS — R590 Localized enlarged lymph nodes: Secondary | ICD-10-CM | POA: Insufficient documentation

## 2015-07-11 DIAGNOSIS — E1122 Type 2 diabetes mellitus with diabetic chronic kidney disease: Secondary | ICD-10-CM | POA: Diagnosis present

## 2015-07-11 DIAGNOSIS — C8330 Diffuse large B-cell lymphoma, unspecified site: Secondary | ICD-10-CM

## 2015-07-11 DIAGNOSIS — C8290 Follicular lymphoma, unspecified, unspecified site: Secondary | ICD-10-CM | POA: Diagnosis present

## 2015-07-11 DIAGNOSIS — R5383 Other fatigue: Secondary | ICD-10-CM

## 2015-07-11 DIAGNOSIS — C8590 Non-Hodgkin lymphoma, unspecified, unspecified site: Secondary | ICD-10-CM | POA: Insufficient documentation

## 2015-07-11 DIAGNOSIS — G629 Polyneuropathy, unspecified: Secondary | ICD-10-CM | POA: Diagnosis not present

## 2015-07-11 DIAGNOSIS — J449 Chronic obstructive pulmonary disease, unspecified: Secondary | ICD-10-CM

## 2015-07-11 DIAGNOSIS — D6481 Anemia due to antineoplastic chemotherapy: Secondary | ICD-10-CM

## 2015-07-11 DIAGNOSIS — E119 Type 2 diabetes mellitus without complications: Secondary | ICD-10-CM

## 2015-07-11 DIAGNOSIS — E1140 Type 2 diabetes mellitus with diabetic neuropathy, unspecified: Secondary | ICD-10-CM

## 2015-07-11 DIAGNOSIS — Z95828 Presence of other vascular implants and grafts: Secondary | ICD-10-CM

## 2015-07-11 DIAGNOSIS — T451X5A Adverse effect of antineoplastic and immunosuppressive drugs, initial encounter: Secondary | ICD-10-CM

## 2015-07-11 LAB — COMPREHENSIVE METABOLIC PANEL WITH GFR
ALT: 20 U/L (ref 17–63)
AST: 25 U/L (ref 15–41)
Albumin: 2 g/dL — ABNORMAL LOW (ref 3.5–5.0)
Alkaline Phosphatase: 95 U/L (ref 38–126)
Anion gap: 6 (ref 5–15)
BUN: 19 mg/dL (ref 6–20)
CO2: 32 mmol/L (ref 22–32)
Calcium: 8.4 mg/dL — ABNORMAL LOW (ref 8.9–10.3)
Chloride: 94 mmol/L — ABNORMAL LOW (ref 101–111)
Creatinine, Ser: 1.63 mg/dL — ABNORMAL HIGH (ref 0.61–1.24)
GFR calc Af Amer: 48 mL/min — ABNORMAL LOW
GFR calc non Af Amer: 42 mL/min — ABNORMAL LOW
Glucose, Bld: 274 mg/dL — ABNORMAL HIGH (ref 65–99)
Potassium: 5.2 mmol/L — ABNORMAL HIGH (ref 3.5–5.1)
Sodium: 132 mmol/L — ABNORMAL LOW (ref 135–145)
Total Bilirubin: 0.3 mg/dL (ref 0.3–1.2)
Total Protein: 5 g/dL — ABNORMAL LOW (ref 6.5–8.1)

## 2015-07-11 LAB — CBC WITH DIFFERENTIAL/PLATELET
Basophils Absolute: 0 K/uL (ref 0.0–0.1)
Basophils Relative: 0 %
Eosinophils Absolute: 0 K/uL (ref 0.0–0.7)
Eosinophils Relative: 0 %
HCT: 25.3 % — ABNORMAL LOW (ref 39.0–52.0)
Hemoglobin: 8.3 g/dL — ABNORMAL LOW (ref 13.0–17.0)
Lymphocytes Relative: 7 %
Lymphs Abs: 0.5 K/uL — ABNORMAL LOW (ref 0.7–4.0)
MCH: 26.9 pg (ref 26.0–34.0)
MCHC: 32.8 g/dL (ref 30.0–36.0)
MCV: 81.9 fL (ref 78.0–100.0)
Monocytes Absolute: 0.4 K/uL (ref 0.1–1.0)
Monocytes Relative: 6 %
Neutro Abs: 6.4 K/uL (ref 1.7–7.7)
Neutrophils Relative %: 87 %
Platelets: 288 K/uL (ref 150–400)
RBC: 3.09 MIL/uL — ABNORMAL LOW (ref 4.22–5.81)
RDW: 14.6 % (ref 11.5–15.5)
WBC: 7.4 K/uL (ref 4.0–10.5)

## 2015-07-11 LAB — LACTATE DEHYDROGENASE: LDH: 442 U/L — AB (ref 98–192)

## 2015-07-11 MED ORDER — SODIUM CHLORIDE 0.9 % IJ SOLN
10.0000 mL | Freq: Once | INTRAMUSCULAR | Status: AC
  Administered 2015-07-11: 10 mL via INTRAVENOUS

## 2015-07-11 MED ORDER — HEPARIN SOD (PORK) LOCK FLUSH 100 UNIT/ML IV SOLN
500.0000 [IU] | Freq: Once | INTRAVENOUS | Status: AC
  Administered 2015-07-11: 500 [IU] via INTRAVENOUS

## 2015-07-11 MED ORDER — HEPARIN SOD (PORK) LOCK FLUSH 100 UNIT/ML IV SOLN
INTRAVENOUS | Status: AC
  Filled 2015-07-11: qty 5

## 2015-07-11 NOTE — Patient Instructions (Addendum)
Billy Gray at [facility] Discharge Instructions  RECOMMENDATIONS MADE BY THE CONSULTANT AND ANY TEST RESULTS WILL BE SENT TO YOUR REFERRING PHYSICIAN.    Exam and discussion completed by Dr Whitney Muse today.  Hold chemotherapy today. You are to weak for [place] to give you treatment today.  We need you to feel a little better before we give you chemotherapy.   Return next Friday for chemotherapy.   Stop the prednisone.  Gabepentin 400 mg three times a day.  Try to take this regularly.  Let us know if our mood gets worse, if you start to feel more depressed.  Your blood count is 8.3, its a little low, if you start having symptoms like more short of breath than normal or extra tired please call us or go to the ER.  Your potassium is little high, hold your bananas for a few days  Return to see the doctor in 1 week  Please call the clinic if you have any questions or concerns.     Thank you for choosing [facility] to provide your oncology and hematology care.  To afford each patient quality time with our provider, please arrive at least 15 minutes before your scheduled appointment time.   Beginning January 23rd 2017 lab work for the [facility] will be done in the  Main lab at [facility] on 1st floor. If you have a lab appointment with the [facility] please come in thru the  Main Entrance and check in at the main information desk  You need to re-schedule your appointment should you arrive 10 or more minutes late.  We strive to give you quality time with our providers, and arriving late affects you and other patients whose appointments are after yours.  Also, if you no show three or more times for appointments you may be dismissed from the clinic at the providers discretion.     Again, thank you for choosing [facility].  Our hope is that these requests will decrease the amount of time that you wait before being  seen by our physicians.       _____________________________________________________________  Should you have questions after your visit to [facility], please contact our office at [phone] between the hours of 8:30 a.m. and 4:30 p.m.  Voicemails left after 4:30 p.m. will not be returned until the following business day.  For prescription refill requests, have your pharmacy contact our office.

## 2015-07-11 NOTE — Progress Notes (Signed)
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    12/05/2014 Pathology Results Soft Tissue Needle Core Biopsy, right retroperitoneal - FOLLICULAR LYMPHOMA. Low grade. lymphocytes are positive for CD20, CD79a, CD10, bcl-6, and bcl-2. 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   12/05/2014 Pathology Results Tissue-Flow Cytometry - MONOCLONAL B-CELL POPULATION IDENTIFIED.  There is a monoclonal population of B-cell with expression of CD10. 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   12/11/2014 - 04/08/2015 Chemotherapy Rituxan, cytoxan, prednisone x 6 cycles   02/10/2015 Imaging CT CAP- Interval response to therapy as evidenced by decrease in size of retroperitoneal and small bowel mesenteric adenopathy.   05/06/2015 Progression CT CAP- Significant disease progression within the abdomen and pelvis with enlarging retroperitoneal masses as described. The previously demonstrated dominant right periaortic lesion centered inferior to the  renal veins has slightly decreased in size.   05/19/2015 Procedure Retroperitoneal mass biopsy by IR   05/19/2015 Pathology Results Retroperitoneal mass, biopsy - DIFFUSE LARGE B-CELL LYMPHOMA.   05/19/2015 Progression Transformation to DLBCL   05/29/2015 Procedure Bone marrow aspiration and biopsy by IR   05/29/2015 Pathology Results Bone Marrow, Aspirate,Biopsy, and Clot, R iliac - SLIGHTLY HYPERCELLULAR BONE MARROW FOR AGE WITH TRILINEAGE HEMATOPOIESIS. - NO TUMOR IDENTIFIED.   05/29/2015 Pathology Results Bone Marrow Flow Cytometry - EXCLUSIVE PRESENCE OF T CELLS WITH NONSPECIFIC CHANGES.   05/30/2015 -  Chemotherapy R-CHOP with Neulasta support   06/28/2015 - 07/01/2015 Hospital Admission Neutropenic fever and Hypotension.   07/11/2015 Treatment Plan Change Treatment deferred x 7 days.     HISTORY OF PRESENTING ILLNESS:  Billy Gray 70 y.o. male is here for follow-up of his NHL. Restaging studies on 11/15 showed significant disease progression within the abdomen and pelvis.  He underwent a biopsy on 05/19/2015 that unfortunately shows a DLBCL. CD 20 + with Ki-67 at 70%.  Billy Gray returns to the [facility] today accompanied by his wife. He was recently hospitalized, and remarks that he feels a lot better than he did a couple of days ago, but he is still not well enough today to receive chemotherapy. Billy Gray admits that he was sick for about 2-3 days before he went into the hospital recently, which he knows was ill-advised.  With regards to his neuropathy, he comments that, in the past, he was taking Gabapentin with another drug, and comments that both of them together were making him dizzy-headed. To deal with this, he started taking a half a pill of Gabapentin at a time, and reports that this dosage is better and doesn't bother him. He remarks that he might take two or three halves of a pill a day. He  was advised that he needs to get on a good dose of Gabapentin and take them on a regular,  established schedule. He obtains his prescription for Gabapentin through the [place], and says that he's been taking them on a consistent schedule for the past 3-4 days.  Billy Gray says that he does not need any more pain pills at the time because, thus far, he hasn't taken that many of them.  Now that he's back at home from his hospital visit, he says "I'm eating but I don't have the appetite I usually have." He confirms that he's noticed that his cough is better, and confirms that his oxygen use is the same. In terms of his SOB, he feels "like normal."  When asked about abdominal issues, he confirms that his bowels are okay and denies any belly pain or diarrhea.  Billy Gray says that he's sleeping alright, and confirms that he's managing to get around the house. He says that, yes, he could have walked in here today if he wanted, but his wife remarks "he didn't want to take the chance."  When asked about his mood, he says it's been "alright." He confirms that he's been getting depressed, and his wife says that he doesn't take his depression pills like he's supposed to. He admits that he's been depressed before, prior to the cancer, as a result of his ongoing chronic illness -- with regards to his breathing, diabetes, etc. He confirms that struggling with chronic illness in the past has been hard. Prompted by this conversation, his wife remarks that he goes in on the 25th for an appointment with his lung doctor.  He denies nausea and vomiting, constipation, diarrhea. No night sweats, fever or chills.   MEDICAL HISTORY:  Past Medical History  Diagnosis Date  . Hypertension   . Diabetes mellitus without complication ([place])   . Chronic kidney disease   . NHL (non-Hodgkin's lymphoma) ([place])   . Lymphoma ([place])     B cell lymphoma  . 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    SURGICAL HISTORY: Past Surgical History  Procedure Laterality Date  . Replacement total knee        right  . Appendectomy      SOCIAL HISTORY: Social History   Social History  . Marital Status: Married    Spouse Name: N/A  . Number of Children: N/A  . Years of Education: N/A   Occupational History  . Not on file.   Social History Main Topics  . Smoking status: Former Smoker    Quit date: 09/07/2001  . Smokeless tobacco: Not on file  . Alcohol Use: No  . Drug Use: No  . Sexual Activity: Not on file   Other Topics Concern  . Not on file   Social History Narrative    FAMILY HISTORY: Family History  Problem Relation Age of Onset  . Diabetes Mother   . Diabetes Father   . Cancer Brother   . Diabetes Brother    indicated that his mother is deceased. He indicated that his father is deceased.   ALLERGIES:  has No Known Allergies.  MEDICATIONS:  Current Outpatient Prescriptions  Medication Sig Dispense Refill  . acetaminophen (TYLENOL) 500 MG tablet Take 500 mg by mouth every 6 (six) hours as needed for moderate pain. Reported on 07/18/2015    . albuterol (PROVENTIL HFA;VENTOLIN HFA) 108 (90 BASE) MCG/ACT inhaler Inhale 2 puffs into the lungs every 6 (six)  hours as needed for wheezing or shortness of breath.    . allopurinol (ZYLOPRIM) 300 MG tablet Take 150 mg by mouth daily.    Marland Kitchen amLODipine (NORVASC) 5 MG tablet Take 1 tablet (5 mg total) by mouth daily. 30 tablet 4  . budesonide-formoterol (SYMBICORT) 160-4.5 MCG/ACT inhaler Inhale 2 puffs into the lungs 2 (two) times daily.     . cholecalciferol (VITAMIN D) 1000 UNITS tablet Take 1,000 Units by mouth daily.    . Cyclophosphamide (CYTOXAN IJ) Inject as directed every 21 ( twenty-one) days. To begin 05/30/15    . DOXOrubicin HCl (ADRIAMYCIN IV) Inject into the vein every 21 ( twenty-one) days. To begin 05/30/15    . furosemide (LASIX) 20 MG tablet Take 1 tablet (20 mg total) by mouth daily. Take daily for 2 more days for fluid retention. (Patient not taking: Reported on 07/18/2015) 2 tablet 0  . gabapentin (NEURONTIN)  800 MG tablet Take 400 mg by mouth 2 (two) times daily.    Marland Kitchen HYDROcodone-acetaminophen (NORCO) 10-325 MG tablet May take 1/2 tab or 1 tab every 6 hours prn pain. (Patient taking differently: Take 0.5-1 tablets by mouth every 6 (six) hours as needed for moderate pain or severe pain. ) 60 tablet 0  . insulin glargine (LANTUS) 100 UNIT/ML injection Inject 0.35 mLs (35 Units total) into the skin at bedtime.    . insulin lispro (HUMALOG) 100 UNIT/ML injection Inject into the skin. Check blood sugar three times a day and administer Humalog insulin as ordered: 151-200 = 2 units,   201-250 = 4 units,      251-300 = 6 units,   Greater than or equal to 301 = 8 units and call MD    . lidocaine-prilocaine (EMLA) cream Apply a quarter size amount to port site 1 hour prior to chemo. Do not rub in. Cover with plastic wrap. 30 g 3  . ondansetron (ZOFRAN) 8 MG tablet Take 1 tablet (8 mg total) by mouth every 8 (eight) hours as needed for nausea or vomiting. (Patient not taking: Reported on 07/18/2015) 30 tablet 2  . OXYGEN Inhale 3 L into the lungs continuous.    . Pegfilgrastim (NEULASTA ONPRO [place]) Inject into the skin every 21 ( twenty-one) days. To be applied after the completion of chemo    . pravastatin (PRAVACHOL) 40 MG tablet Take 40 mg by mouth every evening.    . predniSONE (DELTASONE) 20 MG tablet On Days 1-5 of chemo take 3 tabs (73m total). (Patient taking differently: Take 60 mg by mouth See admin instructions. On Days 1-5 of chemo take 3 tabs (638mtotal).) 45 tablet 1  . prochlorperazine (COMPAZINE) 10 MG tablet Reported on 07/18/2015    . RiTUXimab (RITUXAN IV) Inject into the vein every 21 ( twenty-one) days. To begin 05/30/15    . tamsulosin (FLOMAX) 0.4 MG CAPS capsule Take 0.4 mg by mouth at bedtime.    . Marland Kitcheniotropium (SPIRIVA) 18 MCG inhalation capsule Place 18 mcg into inhaler and inhale daily. 2 puffs daily    . traMADol (ULTRAM) 50 MG tablet Reported on 07/18/2015    . VINCRISTINE SULFATE IV Inject  into the vein every 21 ( twenty-one) days. To begin 05/30/15    . zolpidem (AMBIEN) 10 MG tablet Take 10 mg by mouth at bedtime as needed for sleep.     . citalopram (CELEXA) 40 MG tablet Take 40 mg by mouth daily. Reported on 07/11/2015    . guaiFENesin (MUCINEX) 600 MG 12  hr tablet Take 1 tablet (600 mg total) by mouth 2 (two) times daily. (Patient not taking: Reported on 07/18/2015)     No current facility-administered medications for this visit.    Review of Systems  Positive for malaise/fatigue. Positive for neuropathy.    Involves hands and feet, affects his ability to sleep. Feet feel "frozen and tingly". Positive for diarrhea.    Lasting one day with 2-3 bowel movements, managed with imodium. Last instance of diarrhea occurred last night, resolved on its own. 14 point ROS was done and is otherwise as detailed above or in HPI   PHYSICAL EXAMINATION: ECOG PERFORMANCE STATUS: 1 - Symptomatic but completely ambulatory  Vitals - 1 value per visit 10/15/8339  SYSTOLIC 962  DIASTOLIC 73  Pulse 94  Temperature 99.1  Respirations 20  Weight (lb) 212.8  Height   BMI 32.36  VISIT REPORT    Physical Exam  Constitutional: He is oriented to person, place, and time and well-developed, well-nourished, and in no distress. Talkative. Sitting in chemotherapy treatment bed. HENT:  Head: Normocephalic and atraumatic.  Nose: Nose normal.  Mouth/Throat: Oropharynx is clear and moist. No oropharyngeal exudate.  Eyes: Conjunctivae and EOM are normal. Pupils are equal, round, and reactive to light. Right eye exhibits no discharge. Left eye exhibits no discharge. No scleral icterus.  Neck: Normal range of motion. Neck supple. No tracheal deviation present. No thyromegaly present.  Cardiovascular: Normal rate, regular rhythm and normal heart sounds.  Exam reveals no gallop and no friction rub.   No murmur heard. Pulmonary/Chest: Effort normal and breath sounds normal. He has no wheezes. He has no  rales.  Abdominal: Soft. Bowel sounds are normal. He exhibits no distension and no mass. There is no tenderness. There is no rebound and no guarding.  Obese.   Musculoskeletal: Normal range of motion. He exhibits no edema.  Lymphadenopathy:    He has no cervical adenopathy.  Neurological: He is alert and oriented to person, place, and time. He has normal reflexes. No cranial nerve deficit. Gait normal. Coordination normal.  Skin: Skin is warm and dry. No rash noted.  Psychiatric: Mood, memory, affect and judgment normal.  Nursing note and vitals reviewed.   LABORATORY DATA:  I have reviewed the data as listed  Results for JAC, ROMULUS (MRN [id number]) as of 07/23/2015 17:45  Ref. Range 07/11/2015 09:09  Sodium Latest Ref Range: 135-145 mmol/L 132 (L)  Potassium Latest Ref Range: 3.5-5.1 mmol/L 5.2 (H)  Chloride Latest Ref Range: 101-111 mmol/L 94 (L)  CO2 Latest Ref Range: 22-32 mmol/L 32  BUN Latest Ref Range: 6-20 mg/dL 19  Creatinine Latest Ref Range: 0.61-1.24 mg/dL 1.63 (H)  Calcium Latest Ref Range: 8.9-10.3 mg/dL 8.4 (L)  EGFR (Non-African Amer.) Latest Ref Range: >60 mL/min 42 (L)  EGFR (African American) Latest Ref Range: >60 mL/min 48 (L)  Glucose Latest Ref Range: 65-99 mg/dL 274 (H)  Anion gap Latest Ref Range: 5-15  6  Alkaline Phosphatase Latest Ref Range: 38-126 U/L 95  Albumin Latest Ref Range: 3.5-5.0 g/dL 2.0 (L)  AST Latest Ref Range: 15-41 U/L 25  ALT Latest Ref Range: 17-63 U/L 20  Total Protein Latest Ref Range: 6.5-8.1 g/dL 5.0 (L)  Total Bilirubin Latest Ref Range: 0.3-1.2 mg/dL 0.3  LDH Latest Ref Range: 98-192 U/L 442 (H)  WBC Latest Ref Range: 4.0-10.5 K/uL 7.4  RBC Latest Ref Range: 4.22-5.81 MIL/uL 3.09 (L)  Hemoglobin Latest Ref Range: 13.0-17.0 g/dL 8.3 (L)  HCT Latest Ref Range:  39.0-52.0 % 25.3 (L)  MCV Latest Ref Range: 78.0-100.0 fL 81.9  MCH Latest Ref Range: 26.0-34.0 pg 26.9  MCHC Latest Ref Range: 30.0-36.0 g/dL 32.8  RDW Latest Ref Range:  11.5-15.5 % 14.6  Platelets Latest Ref Range: 150-400 K/uL 288  Neutrophils Latest Units: % 87  Lymphocytes Latest Units: % 7  Monocytes Relative Latest Units: % 6  Eosinophil Latest Units: % 0  Basophil Latest Units: % 0  NEUT# Latest Ref Range: 1.7-7.7 K/uL 6.4  Lymphocyte # Latest Ref Range: 0.7-4.0 K/uL 0.5 (L)  Monocyte # Latest Ref Range: 0.1-1.0 K/uL 0.4  Eosinophils Absolute Latest Ref Range: 0.0-0.7 K/uL 0.0  Basophils Absolute Latest Ref Range: 0.0-0.1 K/uL 0.0      PATHOLOGY:      RADIOLOGY: I have personally reviewed the radiological images as listed and agreed with the findings in the report.  CLINICAL DATA: Diffuse large B-cell lymphoma  EXAM: CT GUIDED DEEP ILIAC BONE ASPIRATION AND CORE BIOPSY  TECHNIQUE: The procedure, risks (including but not limited to bleeding, infection, organ damage ), benefits, and alternatives were explained to the patient. Questions regarding the procedure were encouraged and answered. The patient understands and consents to the procedure. Patient was placed supine on the CT gantry and limited axial scans through the pelvis were obtained. Appropriate skin entry site was identified. Skin site was marked, prepped with Betadine, draped in usual sterile fashion, and infiltrated locally with 1% lidocaine.  Intravenous Fentanyl and Versed were administered as conscious sedation during continuous cardiorespiratory monitoring by the radiology RN, with a total moderate sedation time of less than 30 minutes.  Under CT fluoroscopic guidance an 11-gauge Cook trocar bone needle was advanced into the right iliac bone just lateral to the sacroiliac joint. Once needle tip position was confirmed, coaxial core and aspiration samples were obtained. The final sample was obtained using the guiding needle itself, which was then removed. Post procedure scans show no hematoma or fracture. Patient tolerated procedure  well.  COMPLICATIONS: COMPLICATIONS none  IMPRESSION: 1. Technically successful CT guided right iliac bone core and aspiration biopsy.   Electronically Signed  By: Lucrezia Europe M.D.  On: 05/29/2015 14:24  CLINICAL DATA: Right retroperitoneal mass  EXAM: CT-GUIDED BIOPSY OF A RETROPERITONEAL MASS. CORE.  MEDICATIONS AND MEDICAL HISTORY: Versed 1 mg, Fentanyl 100 mcg.  Additional Medications: None.  ANESTHESIA/SEDATION: Moderate sedation time: 11 minutes  PROCEDURE: The procedure, risks, benefits, and alternatives were explained to the patient. Questions regarding the procedure were encouraged and answered. The patient understands and consents to the procedure.  The back was prepped with ChloraPrep in a sterile fashion, and a sterile drape was applied covering the operative field. A sterile gown and sterile gloves were used for the procedure.  Under CT guidance, a(n) 17 gauge guide needle was advanced into the right retroperitoneal lesion. Subsequently four 18 gauge core biopsies were obtained and placed in saline. The guide needle was removed. Final imaging was performed.  Patient tolerated the procedure well without complication. Vital sign monitoring by nursing staff during the procedure will continue as patient is in the special procedures unit for post procedure observation.  FINDINGS: The images document guide needle placement within the right retroperitoneal mass. Post biopsy images demonstrate no hemorrhage.  COMPLICATIONS: None  IMPRESSION: Successful CT-guided core biopsy of a right retroperitoneal mass.   Electronically Signed  By: Marybelle Killings M.D.  On: 05/19/2015 10:50   [facility]  ECHO COMPLETE WO IMAGE ENHANCING AGENT  Order# [id number]  Reading physician: Satira Sark, MD  Ordering physician: Patrici Ranks, MD  Study date: 07/08/15      PACS Images    Show images for ECHOCARDIOGRAM COMPLETE     Narrative              *[address] [place], [place] [address]              [phone]  ------------------------------------------------------------------- Transthoracic Echocardiography  Patient:  Billy Gray, Billy Gray MR #:    [id number] Study Date: 07/08/2015 Gender:   M Age:    70 Height:   172.7 cm Weight:   100.2 kg BSA:    2.23 m^2 Pt. Status: Room:  PERFORMING  Rayford Halsted SONOGRAPHER Alvino Chapel, RCS ATTENDING  [place], Shannon K ORDERING   [place], Shannon K REFERRING  [place] K  cc:  ------------------------------------------------------------------- LV EF: 60% -  65%  ------------------------------------------------------------------- Indications:   Chemo V67.2.  ------------------------------------------------------------------- History:  PMH: Diffuse large cell lymphoma. Chronic obstructive pulmonary disease. Risk factors: Hypertension. Diabetes mellitus.  ------------------------------------------------------------------- Study Conclusions  - Left ventricle: The cavity size was normal. Wall thickness was increased in a pattern of mild LVH. Systolic function was normal. The estimated ejection fraction was in the range of 60% to 65%. Wall motion was normal; there were no regional wall motion abnormalities. The study is not technically sufficient to allow evaluation of LV diastolic function. - Aortic valve: Mildly calcified annulus. Trileaflet. - Right atrium: Central venous pressure (est): 3 mm Hg. - Atrial septum: No defect or patent foramen ovale was identified. - Tricuspid valve: There was trivial regurgitation. - Pulmonary arteries: PA peak pressure: 28 mm Hg (S). - Pericardium, extracardiac: There was no pericardial effusion.  Impressions:  - Mild LVH with LVEF 60-65%.  Indeterminate diastolic function. Mildly sclerotic aortic valve. Trivial tricuspid regurgitation. PASP 28 mmHg.      ASSESSMENT & PLAN:  Stage IIX Non-Hodgkin's lymphoma, low-grade B cell lymphoma, CD20 and CD10 positive, on a core biopsy of a retroperitoneal mass 12/05/2014 Hypercalcemia of malignancy-resolved, calcium of >15 mg/dl on 12/03/2014 ARF secondary to above Vitamin D 103 pg/ml on 12/04/2014 Pamidronate on 12/20/2014 Hyperglycemia secondary to prednisone/ known history diabetes Klebsiella Bacteremia CT imaging on 05/06/2015 with progressive disease BIOPSY on 05/19/2015 with DLBCL DLBCL Stage II EF 60-65%, no regional wall motion abnormalities, grade 1 diastolic dysfunction RCHOP BMBX negative  I reviewed his ECHO with him. EF looks good.   I recommend that he work on taking Gabapentin 300 4x a day on a regular basis, and stop his prednisone until he resumes therapy next week.  He knows to let us know if his mood is becoming problematic.  Unfortunately, I do not feel he should proceed with therapy today.  We will plan to resume treatment in a week. We will see him back next Friday, at which point we will assess whether he is able to receive chemotherapy.  All questions were answered. The patient knows to call the clinic with any problems, questions or concerns.   This document serves as a record of services personally performed by Ancil Linsey, MD. It was created on her behalf by Toni Amend, a trained medical scribe. The creation of this record is based on the scribe's personal observations and the provider's statements to them. This document has been checked and approved by  the attending provider.  I have reviewed the above documentation for accuracy and completeness, and I agree with the above.  This note was electronically signed.   Kelby Fam. Whitney Muse, MD

## 2015-07-11 NOTE — Progress Notes (Signed)
Billy Gray presented for Portacath access and flush. Proper placement of portacath confirmed by CXR. Portacath located right chest wall accessed with  H 20 needle. Good blood return present. Portacath flushed with 39ml NS and 500U/57ml Heparin and needle removed intact. Procedure without incident. Patient tolerated procedure well.

## 2015-07-17 NOTE — Progress Notes (Signed)
Pcp Not In System No address on file  DLBCL (diffuse large B cell lymphoma) ([place]) - Plan: CT Abdomen Pelvis W Contrast, CT Chest W Contrast, Practitioner attestation of consent, Complete patient signature process for consent form, Care order/instruction, 0.9 %  sodium chloride infusion, sodium chloride flush (NS) 0.9 % injection 10 mL, heparin lock flush 100 unit/mL, heparin lock flush 100 unit/mL, sodium chloride flush (NS) 0.9 % injection 3 mL, Type and screen, Prepare RBC, Transfuse RBC, acetaminophen (TYLENOL) tablet 650 mg, diphenhydrAMINE (BENADRYL) capsule 25 mg  CURRENT THERAPY: R-CHOP  INTERVAL HISTORY: Billy Gray 70 y.o. male returns for followup of DLBCL with negative bone marrow aspiration and biopsy.    DLBCL (diffuse large B cell lymphoma) ([place])   12/03/2014 Imaging CT CAP- Prominent retroperitoneal lymphadenopathy most likely to represent lymphoma.    12/05/2014 Pathology Results Soft Tissue Needle Core Biopsy, right retroperitoneal - FOLLICULAR LYMPHOMA. Low grade. lymphocytes are positive for CD20, CD79a, CD10, bcl-6, and bcl-2. 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   12/05/2014 Pathology Results Tissue-Flow Cytometry - MONOCLONAL B-CELL POPULATION IDENTIFIED.  There is a monoclonal population of B-cell with expression of CD10. 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   12/11/2014 - 04/08/2015 Chemotherapy Rituxan, cytoxan, prednisone x 6 cycles   02/10/2015 Imaging CT CAP- Interval response to therapy as evidenced by decrease in size of retroperitoneal and small bowel mesenteric adenopathy.   05/06/2015 Progression CT CAP- Significant disease progression within the abdomen and pelvis with enlarging retroperitoneal masses as described. The previously demonstrated dominant right periaortic lesion centered inferior to the renal veins has slightly decreased in size.   05/19/2015 Procedure  Retroperitoneal mass biopsy by IR   05/19/2015 Pathology Results Retroperitoneal mass, biopsy - DIFFUSE LARGE B-CELL LYMPHOMA.   05/19/2015 Progression Transformation to DLBCL   05/29/2015 Procedure Bone marrow aspiration and biopsy by IR   05/29/2015 Pathology Results Bone Marrow, Aspirate,Biopsy, and Clot, R iliac - SLIGHTLY HYPERCELLULAR BONE MARROW FOR AGE WITH TRILINEAGE HEMATOPOIESIS. - NO TUMOR IDENTIFIED.   05/29/2015 Pathology Results Bone Marrow Flow Cytometry - EXCLUSIVE PRESENCE OF T CELLS WITH NONSPECIFIC CHANGES.   05/30/2015 -  Chemotherapy R-CHOP with Neulasta support   06/28/2015 - 07/01/2015 Hospital Admission Neutropenic fever and Hypotension.   07/11/2015 Treatment Plan Change Treatment deferred x 7 days.    I personally reviewed and went over laboratory results with the patient.  The results are noted within this dictation.  Labs will be updated today.  His treatment was held last week to allow for an addition week worth of recovery from his hospitalization for pneumonia.  He reports that he feels good.  He denies any nausea/vomiting.  He is moving his bowels without any issues.  He reports that his breathing is at baseline.  He notes that his appetite is stable.  His weight is stable.  His wife confirms that he is back to baseline.   Past Medical History  Diagnosis Date  . Hypertension   . Diabetes mellitus without complication ([place])   . Chronic kidney disease   . NHL (non-Hodgkin's lymphoma) ([place])   . Lymphoma ([place])     B cell lymphoma  . 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    has Acute on chronic kidney failure ([place]); Hypercalcemia; Essential hypertension; Diabetes mellitus with nephropathy ([place]); Retroperitoneal lymphadenopathy; DLBCL (diffuse large B cell lymphoma) ([place]); Chronic respiratory failure ([place]);  COPD with acute exacerbation ([place]); Peripheral edema; Bacteremia; Chronic respiratory failure with hypoxia ([place]); Neutropenic fever  ([place]); SIRS (systemic inflammatory response syndrome) ([place]); Pancytopenia ([place]); COPD (chronic obstructive pulmonary disease) ([place]); Hypoglycemia due to insulin; and CAP (community acquired pneumonia) on his problem list.     has No Known Allergies.  Current Outpatient Prescriptions on File Prior to Visit  Medication Sig Dispense Refill  . acetaminophen (TYLENOL) 500 MG tablet Take 500 mg by mouth every 6 (six) hours as needed for moderate pain. Reported on 07/18/2015    . albuterol (PROVENTIL HFA;VENTOLIN HFA) 108 (90 BASE) MCG/ACT inhaler Inhale 2 puffs into the lungs every 6 (six) hours as needed for wheezing or shortness of breath.    . allopurinol (ZYLOPRIM) 300 MG tablet Take 150 mg by mouth daily.    Marland Kitchen amLODipine (NORVASC) 5 MG tablet Take 1 tablet (5 mg total) by mouth daily. 30 tablet 4  . budesonide-formoterol (SYMBICORT) 160-4.5 MCG/ACT inhaler Inhale 2 puffs into the lungs 2 (two) times daily.     . cholecalciferol (VITAMIN D) 1000 UNITS tablet Take 1,000 Units by mouth daily.    . citalopram (CELEXA) 40 MG tablet Take 40 mg by mouth daily. Reported on 07/11/2015    . Cyclophosphamide (CYTOXAN IJ) Inject as directed every 21 ( twenty-one) days. To begin 05/30/15    . DOXOrubicin HCl (ADRIAMYCIN IV) Inject into the vein every 21 ( twenty-one) days. To begin 05/30/15    . furosemide (LASIX) 20 MG tablet Take 1 tablet (20 mg total) by mouth daily. Take daily for 2 more days for fluid retention. (Patient not taking: Reported on 07/18/2015) 2 tablet 0  . gabapentin (NEURONTIN) 800 MG tablet Take 400 mg by mouth 2 (two) times daily.    Marland Kitchen guaiFENesin (MUCINEX) 600 MG 12 hr tablet Take 1 tablet (600 mg total) by mouth 2 (two) times daily. (Patient not taking: Reported on 07/18/2015)    . HYDROcodone-acetaminophen (NORCO) 10-325 MG tablet May take 1/2 tab or 1 tab every 6 hours prn pain. (Patient taking differently: Take 0.5-1 tablets by mouth every 6 (six) hours as needed for moderate pain or severe  pain. ) 60 tablet 0  . insulin glargine (LANTUS) 100 UNIT/ML injection Inject 0.35 mLs (35 Units total) into the skin at bedtime.    . insulin lispro (HUMALOG) 100 UNIT/ML injection Inject into the skin. Check blood sugar three times a day and administer Humalog insulin as ordered: 151-200 = 2 units,   201-250 = 4 units,      251-300 = 6 units,   Greater than or equal to 301 = 8 units and call MD    . lidocaine-prilocaine (EMLA) cream Apply a quarter size amount to port site 1 hour prior to chemo. Do not rub in. Cover with plastic wrap. 30 g 3  . ondansetron (ZOFRAN) 8 MG tablet Take 1 tablet (8 mg total) by mouth every 8 (eight) hours as needed for nausea or vomiting. (Patient not taking: Reported on 07/18/2015) 30 tablet 2  . OXYGEN Inhale 3 L into the lungs continuous.    . Pegfilgrastim (NEULASTA ONPRO [place]) Inject into the skin every 21 ( twenty-one) days. To be applied after the completion of chemo    . pravastatin (PRAVACHOL) 40 MG tablet Take 40 mg by mouth every evening.    . predniSONE (DELTASONE) 20 MG tablet On Days 1-5 of chemo take 3 tabs (75m total). (Patient taking differently: Take 60 mg by mouth See admin instructions.  On Days 1-5 of chemo take 3 tabs (9m total).) 45 tablet 1  . prochlorperazine (COMPAZINE) 10 MG tablet Reported on 07/18/2015    . RiTUXimab (RITUXAN IV) Inject into the vein every 21 ( twenty-one) days. To begin 05/30/15    . tamsulosin (FLOMAX) 0.4 MG CAPS capsule Take 0.4 mg by mouth at bedtime.    .Marland Kitchentiotropium (SPIRIVA) 18 MCG inhalation capsule Place 18 mcg into inhaler and inhale daily. 2 puffs daily    . traMADol (ULTRAM) 50 MG tablet Reported on 07/18/2015    . VINCRISTINE SULFATE IV Inject into the vein every 21 ( twenty-one) days. To begin 05/30/15    . zolpidem (AMBIEN) 10 MG tablet Take 10 mg by mouth at bedtime as needed for sleep.      Current Facility-Administered Medications on File Prior to Visit  Medication Dose Route Frequency Provider Last Rate Last  Dose  . 0.9 %  sodium chloride infusion   Intravenous Once SPatrici Ranks MD      . acetaminophen (TYLENOL) tablet 650 mg  650 mg Oral Once SPatrici Ranks MD      . cyclophosphamide (CYTOXAN) 840 mg in sodium chloride 0.9 % 250 mL chemo infusion  400 mg/m2 (Treatment Plan Actual) Intravenous Once SPatrici Ranks MD      . diphenhydrAMINE (BENADRYL) capsule 50 mg  50 mg Oral Once SPatrici Ranks MD      . DOXOrubicin (ADRIAMYCIN) chemo injection 106 mg  50 mg/m2 (Treatment Plan Actual) Intravenous Once SPatrici Ranks MD      . fosaprepitant (EMEND) 150 mg in sodium chloride 0.9 % 145 mL IVPB  150 mg Intravenous Once SPatrici Ranks MD      . heparin lock flush 100 unit/mL  500 Units Intracatheter Once PRN SPatrici Ranks MD      . ondansetron (ZOFRAN) 16 mg, dexamethasone (DECADRON) 12 mg in sodium chloride 0.9 % 50 mL IVPB   Intravenous Once SPatrici Ranks MD      . pegfilgrastim (NEULASTA ONPRO KIT) injection 6 mg  6 mg Subcutaneous Once SPatrici Ranks MD      . riTUXimab (RITUXAN) 800 mg in sodium chloride 0.9 % 250 mL (2.4242 mg/mL) chemo infusion  375 mg/m2 (Treatment Plan Actual) Intravenous Once SPatrici Ranks MD      . sodium chloride 0.9 % injection 10 mL  10 mL Intracatheter PRN SPatrici Ranks MD      . vinCRIStine (ONCOVIN) 1 mg in sodium chloride 0.9 % 50 mL chemo infusion  1 mg Intravenous Once SPatrici Ranks MD        Past Surgical History  Procedure Laterality Date  . Replacement total knee      right  . Appendectomy      Denies any headaches, dizziness, double vision, fevers, chills, night sweats, nausea, vomiting, diarrhea, constipation, chest pain, heart palpitations, shortness of breath, blood in stool, black tarry stool, urinary pain, urinary burning, urinary frequency, hematuria.   PHYSICAL EXAMINATION  ECOG PERFORMANCE STATUS: 1 - Symptomatic but completely ambulatory  Filed Vitals:   07/18/15 0933  BP: 127/88  Pulse: 76    Temp: 98 F (36.7 C)  Resp: 20    GENERAL:alert, no distress, well nourished, well developed, comfortable, cooperative, obese, smiling and accompanied by his wife, in a chemo-bed, with [place] in place for O2 delivery. SKIN: skin color, texture, turgor are normal, no rashes or significant lesions HEAD: Normocephalic, No masses, lesions,  tenderness or abnormalities EYES: normal, EOMI, Conjunctiva are pink and non-injected EARS: External ears normal OROPHARYNX:lips, buccal mucosa, and tongue normal and mucous membranes are moist  NECK: supple, trachea midline LYMPH:  not examined BREAST:not examined LUNGS: clear to auscultation , decreased breath sounds HEART: regular rate & rhythm, no murmurs, no gallops, S1 normal and S2 normal ABDOMEN:abdomen soft, obese and normal bowel sounds BACK: Back symmetric, no curvature. EXTREMITIES:less then 2 second capillary refill, no joint deformities, effusion, or inflammation, no skin discoloration, positive findings:  edema B/L LE edema, 1 + pitting in ankles and feet.  NEURO: alert & oriented x 3 with fluent speech, no focal motor/sensory deficits    LABORATORY DATA: CBC    Component Value Date/Time   WBC 6.4 07/18/2015 0942   WBC 8.3 01/02/2015 0959   RBC 3.04* 07/18/2015 0942   RBC 4.10* 01/02/2015 0959   HGB 8.3* 07/18/2015 0942   HGB 12.3* 01/02/2015 0959   HCT 25.9* 07/18/2015 0942   HCT 36.8* 01/02/2015 0959   PLT 312 07/18/2015 0942   PLT 258 01/02/2015 0959   MCV 85.2 07/18/2015 0942   MCV 89.8 01/02/2015 0959   MCH 27.3 07/18/2015 0942   MCH 30.0 01/02/2015 0959   MCHC 32.0 07/18/2015 0942   MCHC 33.4 01/02/2015 0959   RDW 14.6 07/18/2015 0942   RDW 13.3 01/02/2015 0959   LYMPHSABS 0.8 07/18/2015 0942   LYMPHSABS 1.9 01/02/2015 0959   MONOABS 1.4* 07/18/2015 0942   MONOABS 1.3* 01/02/2015 0959   EOSABS 0.2 07/18/2015 0942   EOSABS 0.7* 01/02/2015 0959   BASOSABS 0.0 07/18/2015 0942   BASOSABS 0.1 01/02/2015 0959       Chemistry      Component Value Date/Time   NA 135 07/18/2015 0942   NA 140 01/02/2015 0959   K 4.6 07/18/2015 0942   K 4.3 01/02/2015 0959   CL 94* 07/18/2015 0942   CO2 35* 07/18/2015 0942   CO2 38* 01/02/2015 0959   BUN 17 07/18/2015 0942   BUN 11.6 01/02/2015 0959   CREATININE 1.47* 07/18/2015 0942   CREATININE 2.5* 01/02/2015 0959   GLU 203* 12/20/2014 1609      Component Value Date/Time   CALCIUM 8.9 07/18/2015 0942   CALCIUM 10.1 01/02/2015 0959   CALCIUM 15.0* 12/03/2014 2104   ALKPHOS 66 07/18/2015 0942   ALKPHOS 104 01/02/2015 0959   AST 18 07/18/2015 0942   AST 11 01/02/2015 0959   ALT 15* 07/18/2015 0942   ALT 9 01/02/2015 0959   BILITOT 0.3 07/18/2015 0942   BILITOT 0.28 01/02/2015 0959        PENDING LABS:   RADIOGRAPHIC STUDIES:  Dg Chest 2 View  06/30/2015  CLINICAL DATA:  70 year old male with intermittently productive cough for 1 week with fever for the past 2 days. History of non-Hodgkin's lymphoma. Former smoker. EXAM: CHEST  2 VIEW COMPARISON:  Chest x-ray 06/28/2015. FINDINGS: Right internal jugular power porta cath with tip terminating in the distal superior vena cava. Lung volumes are normal. Bibasilar opacities favored to reflect small bilateral pleural effusions with associated areas of passive atelectasis in the lower lobes of the lungs bilaterally. Underlying airspace consolidation is not excluded, particularly given the patient's symptoms. No evidence of pulmonary edema. Heart size is normal. The patient is rotated to the left on today's exam, resulting in distortion of the mediastinal contours and reduced diagnostic sensitivity and specificity for mediastinal pathology. Atherosclerosis in the thoracic aorta. IMPRESSION: 1. Bibasilar opacities which are favored  to reflect small bilateral pleural effusions with associated passive atelectasis in the lower lobes of the lungs bilaterally, however, given the patient's history of intermittent productive  cough and fever, underlying airspace consolidation from developing infection or sequela of recent aspiration is not excluded. 2. Atherosclerosis. Electronically Signed   By: Vinnie Langton M.D.   On: 06/30/2015 17:49   Dg Chest Port 1 View  06/28/2015  CLINICAL DATA:  70 year old male with cough, congestion and shortness of breath EXAM: PORTABLE CHEST 1 VIEW COMPARISON:  Prior chest x-ray 12/10/2014 FINDINGS: Right IJ approach single-lumen power injectable port catheter. Catheter tip in good position overlying the lower SVC. Cardiac and mediastinal contours are unchanged. Atherosclerotic calcifications in the transverse aorta. Borderline cardiomegaly similar compared to prior. The lungs are clear. No pulmonary edema, focal airspace consolidation, pleural effusion or pneumothorax. No acute osseous abnormality. IMPRESSION: No active disease. Electronically Signed   By: Jacqulynn Cadet M.D.   On: 06/28/2015 14:41     PATHOLOGY:    ASSESSMENT AND PLAN:  DLBCL (diffuse large B cell lymphoma) ([facility]) DLBCL with negative bone marrow aspiration and biopsy after being initially diagnosed with follicular lymphoma in June 2016 resulting in the initiation of Rituxan/Cytoxan/Prednisone x 6 cycles ending in October 2016.  Restaging tests in November demonstrated progression of disease leading to a retroperitoneal mass biopsy with pathology showing transformation to DLBCL.  Bone marrowe aspiration and biopsy is negative for involvement.  Started R-CHOP on 05/30/2015.  Oncology history updated.  Currently on Gabapentin 800 mg BID with improved results, the patient reports.  He will continue with this same dose.  Pre-treatment labs today: CBC diff, CMET, LDH  Breathing is at baseline.  Appetite is good. Weight is stable.  He looks and feels good.  His wife confirms.  Constipation sheet provided today.  He denies any constipation but I want to provide him some education and direction in the event of  constipation.  Today is cycle #3.  He meets treatment parameters today.    We will restage him in 2 weeks with CT CAP.  Orders are placed.  HGB remains at 8.3 g/dL.  We will get him set-up for 2 units of PRBCs as a result. Return following CT scans for follow-up.  He will call in the interim with any issues associated with his malignancy and its treatment.    THERAPY PLAN:  Continue treatment as planned.  We will restage him following this cycle of therapy.  All questions were answered. The patient knows to call the clinic with any problems, questions or concerns. We can certainly see the patient much sooner if necessary.  Patient and plan discussed with Dr. Ancil Linsey and she is in agreement with the aforementioned.   This note is electronically signed by: Doy Mince 07/18/2015 10:25 AM

## 2015-07-17 NOTE — Assessment & Plan Note (Addendum)
DLBCL with negative bone marrow aspiration and biopsy after being initially diagnosed with follicular lymphoma in June 2016 resulting in the initiation of Rituxan/Cytoxan/Prednisone x 6 cycles ending in October 2016.  Restaging tests in November demonstrated progression of disease leading to a retroperitoneal mass biopsy with pathology showing transformation to DLBCL.  Bone marrowe aspiration and biopsy is negative for involvement.  Started R-CHOP on 05/30/2015.  Oncology history updated.  Currently on Gabapentin 800 mg BID with improved results, the patient reports.  He will continue with this same dose.  Pre-treatment labs today: CBC diff, CMET, LDH  Breathing is at baseline.  Appetite is good. Weight is stable.  He looks and feels good.  His wife confirms.  Constipation sheet provided today.  He denies any constipation but I want to provide him some education and direction in the event of constipation.  Today is cycle #3.  He meets treatment parameters today.    We will restage him in 2 weeks with CT CAP.  Orders are placed.  HGB remains at 8.3 g/dL.  We will get him set-up for 2 units of PRBCs as a result. Return following CT scans for follow-up.  He will call in the interim with any issues associated with his malignancy and its treatment.

## 2015-07-18 ENCOUNTER — Encounter (HOSPITAL_BASED_OUTPATIENT_CLINIC_OR_DEPARTMENT_OTHER): Payer: Medicare Other | Admitting: Oncology

## 2015-07-18 ENCOUNTER — Encounter (HOSPITAL_BASED_OUTPATIENT_CLINIC_OR_DEPARTMENT_OTHER): Payer: Medicare Other

## 2015-07-18 ENCOUNTER — Encounter (HOSPITAL_COMMUNITY): Payer: Self-pay | Admitting: Oncology

## 2015-07-18 VITALS — BP 127/88 | HR 76 | Temp 98.0°F | Resp 20 | Wt 212.0 lb

## 2015-07-18 VITALS — BP 122/68 | HR 69 | Temp 97.9°F | Resp 16

## 2015-07-18 DIAGNOSIS — Z5111 Encounter for antineoplastic chemotherapy: Secondary | ICD-10-CM

## 2015-07-18 DIAGNOSIS — C8590 Non-Hodgkin lymphoma, unspecified, unspecified site: Secondary | ICD-10-CM | POA: Diagnosis not present

## 2015-07-18 DIAGNOSIS — Z5189 Encounter for other specified aftercare: Secondary | ICD-10-CM | POA: Diagnosis not present

## 2015-07-18 DIAGNOSIS — Z5112 Encounter for antineoplastic immunotherapy: Secondary | ICD-10-CM

## 2015-07-18 DIAGNOSIS — C8330 Diffuse large B-cell lymphoma, unspecified site: Secondary | ICD-10-CM

## 2015-07-18 DIAGNOSIS — C8338 Diffuse large B-cell lymphoma, lymph nodes of multiple sites: Secondary | ICD-10-CM

## 2015-07-18 LAB — CBC WITH DIFFERENTIAL/PLATELET
Basophils Absolute: 0 10*3/uL (ref 0.0–0.1)
Basophils Relative: 1 %
EOS ABS: 0.2 10*3/uL (ref 0.0–0.7)
EOS PCT: 3 %
HCT: 25.9 % — ABNORMAL LOW (ref 39.0–52.0)
Hemoglobin: 8.3 g/dL — ABNORMAL LOW (ref 13.0–17.0)
LYMPHS ABS: 0.8 10*3/uL (ref 0.7–4.0)
LYMPHS PCT: 12 %
MCH: 27.3 pg (ref 26.0–34.0)
MCHC: 32 g/dL (ref 30.0–36.0)
MCV: 85.2 fL (ref 78.0–100.0)
MONO ABS: 1.4 10*3/uL — AB (ref 0.1–1.0)
MONOS PCT: 22 %
Neutro Abs: 4 10*3/uL (ref 1.7–7.7)
Neutrophils Relative %: 63 %
PLATELETS: 312 10*3/uL (ref 150–400)
RBC: 3.04 MIL/uL — ABNORMAL LOW (ref 4.22–5.81)
RDW: 14.6 % (ref 11.5–15.5)
WBC: 6.4 10*3/uL (ref 4.0–10.5)

## 2015-07-18 LAB — COMPREHENSIVE METABOLIC PANEL
ALT: 15 U/L — ABNORMAL LOW (ref 17–63)
ANION GAP: 6 (ref 5–15)
AST: 18 U/L (ref 15–41)
Albumin: 2.3 g/dL — ABNORMAL LOW (ref 3.5–5.0)
Alkaline Phosphatase: 66 U/L (ref 38–126)
BUN: 17 mg/dL (ref 6–20)
CALCIUM: 8.9 mg/dL (ref 8.9–10.3)
CHLORIDE: 94 mmol/L — AB (ref 101–111)
CO2: 35 mmol/L — AB (ref 22–32)
CREATININE: 1.47 mg/dL — AB (ref 0.61–1.24)
GFR, EST AFRICAN AMERICAN: 55 mL/min — AB (ref >60)
GFR, EST NON AFRICAN AMERICAN: 47 mL/min — AB (ref >60)
Glucose, Bld: 219 mg/dL — ABNORMAL HIGH (ref 65–99)
Potassium: 4.6 mmol/L (ref 3.5–5.1)
SODIUM: 135 mmol/L (ref 135–145)
Total Bilirubin: 0.3 mg/dL (ref 0.3–1.2)
Total Protein: 5.3 g/dL — ABNORMAL LOW (ref 6.5–8.1)

## 2015-07-18 LAB — PREPARE RBC (CROSSMATCH)

## 2015-07-18 LAB — LACTATE DEHYDROGENASE: LDH: 399 U/L — ABNORMAL HIGH (ref 98–192)

## 2015-07-18 MED ORDER — FOSAPREPITANT DIMEGLUMINE INJECTION 150 MG
150.0000 mg | Freq: Once | INTRAVENOUS | Status: AC
  Administered 2015-07-18: 150 mg via INTRAVENOUS
  Filled 2015-07-18: qty 5

## 2015-07-18 MED ORDER — SODIUM CHLORIDE 0.9 % IV SOLN
Freq: Once | INTRAVENOUS | Status: AC
  Administered 2015-07-18: 12:00:00 via INTRAVENOUS
  Filled 2015-07-18: qty 8

## 2015-07-18 MED ORDER — SODIUM CHLORIDE 0.9 % IJ SOLN
10.0000 mL | INTRAMUSCULAR | Status: DC | PRN
  Administered 2015-07-18: 10 mL
  Filled 2015-07-18: qty 10

## 2015-07-18 MED ORDER — DIPHENHYDRAMINE HCL 25 MG PO CAPS
50.0000 mg | ORAL_CAPSULE | Freq: Once | ORAL | Status: AC
  Administered 2015-07-18: 50 mg via ORAL
  Filled 2015-07-18: qty 2

## 2015-07-18 MED ORDER — ACETAMINOPHEN 325 MG PO TABS
ORAL_TABLET | ORAL | Status: AC
  Filled 2015-07-18: qty 2

## 2015-07-18 MED ORDER — DOXORUBICIN HCL CHEMO IV INJECTION 2 MG/ML
50.0000 mg/m2 | Freq: Once | INTRAVENOUS | Status: AC
  Administered 2015-07-18: 106 mg via INTRAVENOUS
  Filled 2015-07-18: qty 53

## 2015-07-18 MED ORDER — ACETAMINOPHEN 325 MG PO TABS
650.0000 mg | ORAL_TABLET | Freq: Once | ORAL | Status: AC
  Administered 2015-07-18: 650 mg via ORAL

## 2015-07-18 MED ORDER — VINCRISTINE SULFATE CHEMO INJECTION 1 MG/ML
1.0000 mg | Freq: Once | INTRAVENOUS | Status: AC
  Administered 2015-07-18: 1 mg via INTRAVENOUS
  Filled 2015-07-18: qty 1

## 2015-07-18 MED ORDER — SODIUM CHLORIDE 0.9 % IV SOLN
Freq: Once | INTRAVENOUS | Status: AC
  Administered 2015-07-18: 11:00:00 via INTRAVENOUS

## 2015-07-18 MED ORDER — PEGFILGRASTIM 6 MG/0.6ML ~~LOC~~ PSKT
6.0000 mg | PREFILLED_SYRINGE | Freq: Once | SUBCUTANEOUS | Status: AC
  Administered 2015-07-18: 6 mg via SUBCUTANEOUS
  Filled 2015-07-18: qty 0.6

## 2015-07-18 MED ORDER — SODIUM CHLORIDE 0.9 % IV SOLN
375.0000 mg/m2 | Freq: Once | INTRAVENOUS | Status: AC
  Administered 2015-07-18: 800 mg via INTRAVENOUS
  Filled 2015-07-18: qty 70

## 2015-07-18 MED ORDER — SODIUM CHLORIDE 0.9 % IV SOLN
400.0000 mg/m2 | Freq: Once | INTRAVENOUS | Status: AC
  Administered 2015-07-18: 840 mg via INTRAVENOUS
  Filled 2015-07-18: qty 42

## 2015-07-18 MED ORDER — HEPARIN SOD (PORK) LOCK FLUSH 100 UNIT/ML IV SOLN
500.0000 [IU] | Freq: Once | INTRAVENOUS | Status: AC | PRN
  Administered 2015-07-18: 500 [IU]
  Filled 2015-07-18: qty 5

## 2015-07-18 NOTE — Progress Notes (Signed)
Tolerated infusion well. ..Marland KitchenMarland KitchenMarlou Gray also  for [employer] on body injector. See MAR for administration details. Injector in place and engaged with green light indicator on flashing. Tolerated application with out problems.

## 2015-07-18 NOTE — Addendum Note (Signed)
Addended by: Joie Bimler on: 07/18/2015 01:07 PM   Modules accepted: Orders

## 2015-07-18 NOTE — Patient Instructions (Addendum)
[place] at [facility] Discharge Instructions  RECOMMENDATIONS MADE BY THE CONSULTANT AND ANY TEST RESULTS WILL BE SENT TO YOUR REFERRING PHYSICIAN.  Exam and discussion today with Kirby Crigler, PA. Chemotherapy as planned today. Blood transfusion (2 units) on Monday as scheduled. CT scan of chest, abdomen, and pelvis as scheduled in 2 weeks.  Office visit with Dr. Whitney Muse following CT scans.  Thank you for choosing [place] at [facility] to provide your oncology and hematology care.  To afford each patient quality time with our provider, please arrive at least 15 minutes before your scheduled appointment time.   Beginning January 23rd 2017 lab work for the [facility] will be done in the  Main lab at [facility] on 1st floor. If you have a lab appointment with the [facility] please come in thru the  Main Entrance and check in at the main information desk  You need to re-schedule your appointment should you arrive 10 or more minutes late.  We strive to give you quality time with our providers, and arriving late affects you and other patients whose appointments are after yours.  Also, if you no show three or more times for appointments you may be dismissed from the clinic at the providers discretion.     Again, thank you for choosing [facility].  Our hope is that these requests will decrease the amount of time that you wait before being seen by our physicians.       _____________________________________________________________  Should you have questions after your visit to [facility], please contact our office at [phone] between the hours of 8:30 a.m. and 4:30 p.m.  Voicemails left after 4:30 p.m. will not be returned until the following business day.  For prescription refill requests, have your pharmacy contact our office.

## 2015-07-18 NOTE — Patient Instructions (Signed)
[facility] Discharge Instructions for Patients Receiving Chemotherapy  Today you received the following chemotherapy agents Doxorubicin,Vincristine,Cytoxan and Rituxan.  To help prevent nausea and vomiting after your treatment, we encourage you to take your nausea medication as instructed. If you develop nausea and vomiting that is not controlled by your nausea medication, call the clinic. If it is after clinic hours your family physician or the after hours number for the clinic or go to the Emergency Department.  BELOW ARE SYMPTOMS THAT SHOULD BE REPORTED IMMEDIATELY:  *FEVER GREATER THAN 101.0 F  *CHILLS WITH OR WITHOUT FEVER  NAUSEA AND VOMITING THAT IS NOT CONTROLLED WITH YOUR NAUSEA MEDICATION  *UNUSUAL SHORTNESS OF BREATH  *UNUSUAL BRUISING OR BLEEDING  TENDERNESS IN MOUTH AND THROAT WITH OR WITHOUT PRESENCE OF ULCERS  *URINARY PROBLEMS  *BOWEL PROBLEMS  UNUSUAL RASH Items with * indicate a potential emergency and should be followed up as soon as possible.  Return as scheduled.  I have been informed and understand all the instructions given to me. I know to contact the clinic, my physician, or go to the Emergency Department if any problems should occur. I do not have any questions at this time, but understand that I may call the clinic during office hours or the Patient Navigator at [phone] should I have any questions or need assistance in obtaining follow up care.    __________________________________________  _____________  __________ Signature of Patient or Authorized Representative            Date                   Time    __________________________________________ Nurse's Signature

## 2015-07-21 ENCOUNTER — Encounter (HOSPITAL_BASED_OUTPATIENT_CLINIC_OR_DEPARTMENT_OTHER): Payer: Medicare Other

## 2015-07-21 ENCOUNTER — Encounter (HOSPITAL_COMMUNITY): Payer: Self-pay

## 2015-07-21 VITALS — BP 124/66 | HR 84 | Temp 98.5°F | Resp 20

## 2015-07-21 DIAGNOSIS — C8330 Diffuse large B-cell lymphoma, unspecified site: Secondary | ICD-10-CM

## 2015-07-21 DIAGNOSIS — C8590 Non-Hodgkin lymphoma, unspecified, unspecified site: Secondary | ICD-10-CM | POA: Diagnosis not present

## 2015-07-21 DIAGNOSIS — D649 Anemia, unspecified: Secondary | ICD-10-CM

## 2015-07-21 MED ORDER — SODIUM CHLORIDE 0.9 % IV SOLN
250.0000 mL | Freq: Once | INTRAVENOUS | Status: AC
  Administered 2015-07-21: 250 mL via INTRAVENOUS

## 2015-07-21 MED ORDER — ACETAMINOPHEN 325 MG PO TABS
ORAL_TABLET | ORAL | Status: AC
  Filled 2015-07-21: qty 2

## 2015-07-21 MED ORDER — SODIUM CHLORIDE 0.9% FLUSH
10.0000 mL | INTRAVENOUS | Status: AC | PRN
  Administered 2015-07-21: 10 mL

## 2015-07-21 MED ORDER — DIPHENHYDRAMINE HCL 25 MG PO CAPS
ORAL_CAPSULE | ORAL | Status: AC
  Filled 2015-07-21: qty 1

## 2015-07-21 MED ORDER — HEPARIN SOD (PORK) LOCK FLUSH 100 UNIT/ML IV SOLN
500.0000 [IU] | Freq: Every day | INTRAVENOUS | Status: AC | PRN
  Administered 2015-07-21: 500 [IU]

## 2015-07-21 MED ORDER — ACETAMINOPHEN 325 MG PO TABS
650.0000 mg | ORAL_TABLET | Freq: Once | ORAL | Status: AC
  Administered 2015-07-21: 650 mg via ORAL

## 2015-07-21 MED ORDER — DIPHENHYDRAMINE HCL 25 MG PO CAPS
25.0000 mg | ORAL_CAPSULE | Freq: Once | ORAL | Status: AC
  Administered 2015-07-21: 25 mg via ORAL

## 2015-07-21 MED ORDER — HEPARIN SOD (PORK) LOCK FLUSH 100 UNIT/ML IV SOLN
INTRAVENOUS | Status: AC
  Filled 2015-07-21: qty 5

## 2015-07-21 NOTE — Patient Instructions (Signed)
..  [place] at [facility] Discharge Instructions  RECOMMENDATIONS MADE BY THE CONSULTANT AND ANY TEST RESULTS WILL BE SENT TO YOUR REFERRING PHYSICIAN.  Blood transfusion today  Thank you for choosing [place] at [facility] to provide your oncology and hematology care.  To afford each patient quality time with our provider, please arrive at least 15 minutes before your scheduled appointment time.   Beginning January 23rd 2017 lab work for the [facility] will be done in the  Main lab at [facility] on 1st floor. If you have a lab appointment with the [facility] please come in thru the  Main Entrance and check in at the main information desk  You need to re-schedule your appointment should you arrive 10 or more minutes late.  We strive to give you quality time with our providers, and arriving late affects you and other patients whose appointments are after yours.  Also, if you no show three or more times for appointments you may be dismissed from the clinic at the providers discretion.     Again, thank you for choosing [facility].  Our hope is that these requests will decrease the amount of time that you wait before being seen by our physicians.       _____________________________________________________________  Should you have questions after your visit to [facility], please contact our office at [phone] between the hours of 8:30 a.m. and 4:30 p.m.  Voicemails left after 4:30 p.m. will not be returned until the following business day.  For prescription refill requests, have your pharmacy contact our office.   Blood Transfusion, Care After These instructions give you information about caring for yourself after your procedure. Your doctor may also give you more specific instructions. Call your doctor if you have any problems or questions after your procedure.  HOME CARE  Take medicines only as told by your  doctor. Ask your doctor if you can take an over-the-counter pain reliever if you have a fever or headache a day or two after your procedure.  Return to your normal activities as told by your doctor. GET HELP IF:   You develop redness or irritation at your IV site.  You have a fever, chills, or a headache that does not go away.  Your pee (urine) is darker than normal.  Your urine turns:  Pink.  Red.  [place]ark.  The white part of your eye turns yellow (jaundice).  You feel weak after doing your normal activities. GET HELP RIGHT AWAY IF:   You have trouble breathing.  You have fever and chills and you also have:  Anxiety.  Chest or back pain.  Flushed or pink skin.  Clammy or sweaty skin.  A fast heartbeat.  A sick feeling in your stomach (nausea).   This information is not intended to replace advice given to you by your health care provider. Make sure you discuss any questions you have with your health care provider.   Document Released: 06/28/2014 Document Reviewed: 06/28/2014 Elsevier Interactive Patient Education [employer].

## 2015-07-21 NOTE — Progress Notes (Signed)
..  Billy Gray here today for blood transfusion. Teaching, consent, and premed completed. Tolerated infusion without problems

## 2015-07-22 LAB — TYPE AND SCREEN
ABO/RH(D): O POS
Antibody Screen: NEGATIVE
UNIT DIVISION: 0
UNIT DIVISION: 0

## 2015-08-01 ENCOUNTER — Ambulatory Visit (HOSPITAL_COMMUNITY)
Admission: RE | Admit: 2015-08-01 | Discharge: 2015-08-01 | Disposition: A | Discharge status: Home / Self Care | Payer: Medicare Other | Source: Ambulatory Visit | Attending: Oncology | Admitting: Oncology

## 2015-08-01 ENCOUNTER — Inpatient Hospital Stay (HOSPITAL_COMMUNITY): Payer: Medicare Other

## 2015-08-01 DIAGNOSIS — C8330 Diffuse large B-cell lymphoma, unspecified site: Secondary | ICD-10-CM | POA: Diagnosis not present

## 2015-08-01 DIAGNOSIS — J439 Emphysema, unspecified: Secondary | ICD-10-CM | POA: Diagnosis not present

## 2015-08-01 DIAGNOSIS — J90 Pleural effusion, not elsewhere classified: Secondary | ICD-10-CM | POA: Diagnosis not present

## 2015-08-01 DIAGNOSIS — I2510 Atherosclerotic heart disease of native coronary artery without angina pectoris: Secondary | ICD-10-CM | POA: Diagnosis not present

## 2015-08-01 MED ORDER — IOHEXOL 300 MG/ML  SOLN
80.0000 mL | Freq: Once | INTRAMUSCULAR | Status: AC | PRN
  Administered 2015-08-01: 80 mL via INTRAVENOUS

## 2015-08-04 ENCOUNTER — Encounter (HOSPITAL_COMMUNITY): Payer: Medicare Other | Attending: Hematology & Oncology | Admitting: Hematology & Oncology

## 2015-08-04 ENCOUNTER — Encounter (HOSPITAL_COMMUNITY): Payer: Self-pay | Admitting: Hematology & Oncology

## 2015-08-04 VITALS — BP 130/72 | HR 86 | Temp 98.5°F | Resp 20 | Wt 209.0 lb

## 2015-08-04 DIAGNOSIS — C8290 Follicular lymphoma, unspecified, unspecified site: Secondary | ICD-10-CM | POA: Insufficient documentation

## 2015-08-04 DIAGNOSIS — D6481 Anemia due to antineoplastic chemotherapy: Secondary | ICD-10-CM

## 2015-08-04 DIAGNOSIS — C8330 Diffuse large B-cell lymphoma, unspecified site: Secondary | ICD-10-CM

## 2015-08-04 DIAGNOSIS — E119 Type 2 diabetes mellitus without complications: Secondary | ICD-10-CM

## 2015-08-04 DIAGNOSIS — N189 Chronic kidney disease, unspecified: Secondary | ICD-10-CM | POA: Insufficient documentation

## 2015-08-04 DIAGNOSIS — E1140 Type 2 diabetes mellitus with diabetic neuropathy, unspecified: Secondary | ICD-10-CM

## 2015-08-04 DIAGNOSIS — J449 Chronic obstructive pulmonary disease, unspecified: Secondary | ICD-10-CM

## 2015-08-04 DIAGNOSIS — C8333 Diffuse large B-cell lymphoma, intra-abdominal lymph nodes: Secondary | ICD-10-CM

## 2015-08-04 DIAGNOSIS — R590 Localized enlarged lymph nodes: Secondary | ICD-10-CM | POA: Insufficient documentation

## 2015-08-04 DIAGNOSIS — C8590 Non-Hodgkin lymphoma, unspecified, unspecified site: Secondary | ICD-10-CM | POA: Insufficient documentation

## 2015-08-04 DIAGNOSIS — R5383 Other fatigue: Secondary | ICD-10-CM

## 2015-08-04 DIAGNOSIS — E1122 Type 2 diabetes mellitus with diabetic chronic kidney disease: Secondary | ICD-10-CM | POA: Insufficient documentation

## 2015-08-04 DIAGNOSIS — T451X5A Adverse effect of antineoplastic and immunosuppressive drugs, initial encounter: Secondary | ICD-10-CM

## 2015-08-04 DIAGNOSIS — N183 Chronic kidney disease, stage 3 (moderate): Secondary | ICD-10-CM

## 2015-08-04 NOTE — Progress Notes (Signed)
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    12/05/2014 Pathology Results Soft Tissue Needle Core Biopsy, right retroperitoneal - FOLLICULAR LYMPHOMA. Low grade. lymphocytes are positive for CD20, CD79a, CD10, bcl-6, and bcl-2. 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   12/05/2014 Pathology Results Tissue-Flow Cytometry - MONOCLONAL B-CELL POPULATION IDENTIFIED.  There is a monoclonal population of B-cell with expression of CD10. 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   12/11/2014 - 04/08/2015 Chemotherapy Rituxan, cytoxan, prednisone x 6 cycles   02/10/2015 Imaging CT CAP- Interval response to therapy as evidenced by decrease in size of retroperitoneal and small bowel mesenteric adenopathy.   05/06/2015 Progression CT CAP- Significant disease progression within the abdomen and pelvis with enlarging retroperitoneal masses as described. The previously demonstrated dominant right periaortic lesion centered inferior to the  renal veins has slightly decreased in size.   05/19/2015 Procedure Retroperitoneal mass biopsy by IR   05/19/2015 Pathology Results Retroperitoneal mass, biopsy - DIFFUSE LARGE B-CELL LYMPHOMA.   05/19/2015 Progression Transformation to DLBCL   05/29/2015 Procedure Bone marrow aspiration and biopsy by IR   05/29/2015 Pathology Results Bone Marrow, Aspirate,Biopsy, and Clot, R iliac - SLIGHTLY HYPERCELLULAR BONE MARROW FOR AGE WITH TRILINEAGE HEMATOPOIESIS. - NO TUMOR IDENTIFIED.   05/29/2015 Pathology Results Bone Marrow Flow Cytometry - EXCLUSIVE PRESENCE OF T CELLS WITH NONSPECIFIC CHANGES.   05/30/2015 -  Chemotherapy R-CHOP with Neulasta support   06/28/2015 - 07/01/2015 Hospital Admission Neutropenic fever and Hypotension.   07/11/2015 Treatment Plan Change Treatment deferred x 7 days.     HISTORY OF PRESENTING ILLNESS:  [name] 70 y.o. male is here for follow-up of his NHL. Restaging studies on 11/15 showed significant disease progression within the abdomen and pelvis.  He underwent a biopsy on 05/19/2015 that unfortunately shows a DLBCL. CD 20 + with Ki-67 at 70%. BMBX was negative for disease. Due to his comorbidities he has been treated with R-mini CHOP. He has had one hospitalization for a COPD exacerbation/pneumonia.  Mr. Radilla is accompanied by his wife and grandson. I personally reviewed and discussed imaging results at length with the patient, along with upcoming referral plans.   He has a nasal cannula in place. Reports he is no longer experiencing pain. He has felt better over the last 2 weeks with increased appetite and without nausea. His only continued complaint is trouble breathing. His fingers and toes neuropathy is unchanged, "they feel about normal". Denies any pain while eating, nausea, or bowel issues. Previous cough has improved. Notes some runny nose this morning with cough.   Denies diarrhea or constipation. No abdominal pain.   MEDICAL HISTORY:  Past Medical History  Diagnosis Date  . Hypertension   . Diabetes mellitus without complication ([place])   . Chronic kidney disease   . NHL (non-Hodgkin's lymphoma) ([place])   . Lymphoma ([place])     B cell lymphoma  . 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    SURGICAL HISTORY: Past Surgical History  Procedure Laterality Date  . Replacement total knee      right  . Appendectomy      SOCIAL HISTORY: Social History   Social History  . Marital Status: Married    Spouse Name: N/A  . Number of Children: N/A  . Years of Education: N/A   Occupational History  . Not on file.   Social History Main Topics  . Smoking status: Former Smoker    Quit date: 09/07/2001  . Smokeless tobacco: Not on file  . Alcohol Use: No  . Drug Use: No  . Sexual Activity: Not on file   Other Topics Concern  . Not on file   Social History Narrative  Married for 45 years. 3 children 5 grandchildren Ex smoker, quit 13 years ago. Currently chews tobacco. ETOH, none. 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  FAMILY HISTORY: Family History  Problem Relation Age of Onset  . Diabetes Mother   . Diabetes Father   . Cancer Brother   . Diabetes Brother    indicated that his mother is deceased. He indicated that his father is deceased.  Mother deceased 1 yo, due to heart failure during open heart surgery. Father deceased 89 yo, he was an alcoholic and had diabetes. He died in a [place] hospital. Brother died quickly from small cell cancer that started in his stomach. Sister living on a lot of medication.  ALLERGIES:  has No Known Allergies.  MEDICATIONS:  Current Outpatient Prescriptions  Medication Sig Dispense Refill  . acetaminophen (TYLENOL) 500 MG tablet Take 500 mg by mouth every 6 (six) hours as needed for moderate pain. Reported on 07/18/2015    . albuterol (PROVENTIL HFA;VENTOLIN HFA) 108 (90 BASE)  MCG/ACT inhaler Inhale 2 puffs into the lungs every 6 (six) hours as needed for wheezing or shortness of breath.    . allopurinol (ZYLOPRIM) 300 MG tablet Take 150 mg by mouth daily.    Marland Kitchen amLODipine (NORVASC) 5 MG tablet Take 1 tablet (5 mg total) by mouth daily. 30 tablet 4  . budesonide-formoterol (SYMBICORT) 160-4.5 MCG/ACT inhaler Inhale 2 puffs into the lungs 2 (two) times daily.     . cholecalciferol (VITAMIN D) 1000 UNITS tablet Take 1,000 Units by mouth daily.    . citalopram (CELEXA) 40 MG tablet Take 40 mg by mouth daily. Reported on 07/11/2015    . Cyclophosphamide (CYTOXAN IJ) Inject as directed every 21 ( twenty-one) days. To begin 05/30/15    . DOXOrubicin HCl (ADRIAMYCIN IV) Inject into the vein every 21 ( twenty-one) days. To begin 05/30/15    . furosemide (LASIX) 20 MG tablet Take 1 tablet (20 mg total) by mouth daily. Take daily for 2 more days for fluid retention. (Patient not taking: Reported on 07/18/2015) 2 tablet 0  . gabapentin (NEURONTIN) 800 MG tablet Take 400 mg by mouth 2 (two) times daily.    Marland Kitchen guaiFENesin (MUCINEX) 600 MG 12 hr tablet Take 1 tablet (600 mg total) by mouth 2 (two) times daily. (Patient not taking: Reported on 07/18/2015)    .  HYDROcodone-acetaminophen (NORCO) 10-325 MG tablet May take 1/2 tab or 1 tab every 6 hours prn pain. (Patient taking differently: Take 0.5-1 tablets by mouth every 6 (six) hours as needed for moderate pain or severe pain. ) 60 tablet 0  . insulin glargine (LANTUS) 100 UNIT/ML injection Inject 0.35 mLs (35 Units total) into the skin at bedtime.    . insulin lispro (HUMALOG) 100 UNIT/ML injection Inject into the skin. Check blood sugar three times a day and administer Humalog insulin as ordered: 151-200 = 2 units,   201-250 = 4 units,      251-300 = 6 units,   Greater than or equal to 301 = 8 units and call MD    . lidocaine-prilocaine (EMLA) cream Apply a quarter size amount to port site 1 hour prior to chemo. Do not rub in. Cover with  plastic wrap. 30 g 3  . ondansetron (ZOFRAN) 8 MG tablet Take 1 tablet (8 mg total) by mouth every 8 (eight) hours as needed for nausea or vomiting. (Patient not taking: Reported on 07/18/2015) 30 tablet 2  . OXYGEN Inhale 3 L into the lungs continuous.    . Pegfilgrastim (NEULASTA ONPRO [place]) Inject into the skin every 21 ( twenty-one) days. To be applied after the completion of chemo    . pravastatin (PRAVACHOL) 40 MG tablet Take 40 mg by mouth every evening.    . predniSONE (DELTASONE) 20 MG tablet On Days 1-5 of chemo take 3 tabs (44m total). (Patient taking differently: Take 60 mg by mouth See admin instructions. On Days 1-5 of chemo take 3 tabs (676mtotal).) 45 tablet 1  . prochlorperazine (COMPAZINE) 10 MG tablet Reported on 07/18/2015    . RiTUXimab (RITUXAN IV) Inject into the vein every 21 ( twenty-one) days. To begin 05/30/15    . tamsulosin (FLOMAX) 0.4 MG CAPS capsule Take 0.4 mg by mouth at bedtime.    . Marland Kitcheniotropium (SPIRIVA) 18 MCG inhalation capsule Place 18 mcg into inhaler and inhale daily. 2 puffs daily    . traMADol (ULTRAM) 50 MG tablet Reported on 07/18/2015    . VINCRISTINE SULFATE IV Inject into the vein every 21 ( twenty-one) days. To begin 05/30/15    . zolpidem (AMBIEN) 10 MG tablet Take 10 mg by mouth at bedtime as needed for sleep.      No current facility-administered medications for this visit.    Review of Systems  Positive for neuropathy.    Involves hands and feet, affects his ability to sleep. Feet feel "frozen and tingly". (Unchanged) Positive for shortness of breath. 14 point ROS was done and is otherwise as detailed above or in HPI   PHYSICAL EXAMINATION: ECOG PERFORMANCE STATUS: 1 - Symptomatic but completely ambulatory  Vitals with BMI 08/04/2015  Height   Weight 209 lbs  BMI   Systolic 13277Diastolic 72  Pulse 86  Respirations 20    Physical Exam  Constitutional: He is oriented to person, place, and time and well-developed, well-nourished, and  in no distress. Talkative. Nasal cannula in place. HENT:  Head: Normocephalic and atraumatic.  Nose: Nose normal.  Mouth/Throat: Oropharynx is clear and moist. No oropharyngeal exudate.  Eyes: Conjunctivae and EOM are normal. Pupils are equal, round, and reactive to light. Right eye exhibits no discharge. Left eye exhibits no discharge. No scleral icterus.  Neck: Normal range of motion. Neck supple. No tracheal deviation present. No thyromegaly present.  Cardiovascular: Normal rate, regular rhythm and normal heart sounds.  Exam  reveals no gallop and no friction rub.   No murmur heard. Pulmonary/Chest: He has no wheezes. He has no rales.  Decreased breath sounds throughout Abdominal: Soft. Bowel sounds are normal. He exhibits no mass. There is no tenderness. There is no rebound and no guarding.  Abdominal distension but non-tender. Obese.   Musculoskeletal: Normal range of motion. Bilateral lower extremity edema noted. Lymphadenopathy:    He has no cervical adenopathy.  Neurological: He is alert and oriented to person, place, and time. He has normal reflexes. No cranial nerve deficit. Gait normal. Coordination normal.  Skin: Skin is warm and dry. No rash noted.  Psychiatric: Mood, memory, affect and judgment normal.  Nursing note and vitals reviewed.   LABORATORY DATA:  I have reviewed the data as listed  Results for MARCELLO, TUZZOLINO (MRN [id number]) as of 08/04/2015 09:51  Ref. Range 07/18/2015 09:42  Sodium Latest Ref Range: 135-145 mmol/L 135  Potassium Latest Ref Range: 3.5-5.1 mmol/L 4.6  Chloride Latest Ref Range: 101-111 mmol/L 94 (L)  CO2 Latest Ref Range: 22-32 mmol/L 35 (H)  BUN Latest Ref Range: 6-20 mg/dL 17  Creatinine Latest Ref Range: 0.61-1.24 mg/dL 1.47 (H)  Calcium Latest Ref Range: 8.9-10.3 mg/dL 8.9  EGFR (Non-African Amer.) Latest Ref Range: >60 mL/min 47 (L)  EGFR (African American) Latest Ref Range: >60 mL/min 55 (L)  Glucose Latest Ref Range: 65-99 mg/dL 219 (H)    Anion gap Latest Ref Range: 5-15  6  Alkaline Phosphatase Latest Ref Range: 38-126 U/L 66  Albumin Latest Ref Range: 3.5-5.0 g/dL 2.3 (L)  AST Latest Ref Range: 15-41 U/L 18  ALT Latest Ref Range: 17-63 U/L 15 (L)  Total Protein Latest Ref Range: 6.5-8.1 g/dL 5.3 (L)  Total Bilirubin Latest Ref Range: 0.3-1.2 mg/dL 0.3  LDH Latest Ref Range: 98-192 U/L 399 (H)  WBC Latest Ref Range: 4.0-10.5 K/uL 6.4  RBC Latest Ref Range: 4.22-5.81 MIL/uL 3.04 (L)  Hemoglobin Latest Ref Range: 13.0-17.0 g/dL 8.3 (L)  HCT Latest Ref Range: 39.0-52.0 % 25.9 (L)  MCV Latest Ref Range: 78.0-100.0 fL 85.2  MCH Latest Ref Range: 26.0-34.0 pg 27.3  MCHC Latest Ref Range: 30.0-36.0 g/dL 32.0  RDW Latest Ref Range: 11.5-15.5 % 14.6  Platelets Latest Ref Range: 150-400 K/uL 312  Neutrophils Latest Units: % 63  Lymphocytes Latest Units: % 12  Monocytes Relative Latest Units: % 22  Eosinophil Latest Units: % 3  Basophil Latest Units: % 1  NEUT# Latest Ref Range: 1.7-7.7 K/uL 4.0  Lymphocyte # Latest Ref Range: 0.7-4.0 K/uL 0.8  Monocyte # Latest Ref Range: 0.1-1.0 K/uL 1.4 (H)  Eosinophils Absolute Latest Ref Range: 0.0-0.7 K/uL 0.2  Basophils Absolute Latest Ref Range: 0.0-0.1 K/uL 0.0     PATHOLOGY:      RADIOLOGY: I have personally reviewed the radiological images as listed and agreed with the findings in the report.  Study Result     CLINICAL DATA: 70 year old male with history of diffuse large B-cell lymphoma. Restaging examination after 3 cycles of chemotherapy.  EXAM: CT CHEST, ABDOMEN, AND PELVIS WITH CONTRAST  TECHNIQUE: Multidetector CT imaging of the chest, abdomen and pelvis was performed following the standard protocol during bolus administration of intravenous contrast.  CONTRAST: 105m OMNIPAQUE IOHEXOL 300 MG/ML SOLN  COMPARISON: CT the chest, abdomen and pelvis 05/06/2015.  FINDINGS: CT CHEST FINDINGS  Mediastinum/Lymph Nodes: Heart size is normal. There  is no significant pericardial fluid, thickening or pericardial calcification. There is atherosclerosis of the thoracic aorta, the great vessels  of the mediastinum and the coronary arteries, including calcified atherosclerotic plaque in the left main, left anterior descending, left circumflex and right coronary arteries. No pathologically enlarged mediastinal or hilar lymph nodes. Esophagus is unremarkable in appearance. No axillary lymphadenopathy. Right internal jugular single-lumen porta cath with tip terminating at the superior cavoatrial junction.  Lungs/Pleura: Trace right pleural effusion layering dependently is new compared to the prior study. Mild diffuse bronchial wall thickening with mild centrilobular and paraseptal emphysema. Areas of more extensive thickening of the peribronchovascular interstitium in the right lower and right middle lobes, with some areas of architectural distortion, similar prior studies, presumably from post infectious or inflammatory scarring. No acute consolidative airspace disease. No definite suspicious appearing pulmonary nodules or masses are noted.  Musculoskeletal/Soft Tissues: There are no aggressive appearing lytic or blastic lesions noted in the visualized portions of the skeleton.  CT ABDOMEN AND PELVIS FINDINGS  Hepatobiliary: No cystic or solid hepatic lesions. No intra or extrahepatic biliary ductal dilatation. Gallbladder is nearly completely decompressed, but otherwise unremarkable in appearance.  Pancreas: No pancreatic mass. No pancreatic ductal dilatation. No pancreatic or peripancreatic fluid or inflammatory changes.  Spleen: Unremarkable.  Adrenals/Urinary Tract: Multiple low-attenuation lesions associated with both kidneys, the largest of which include an exophytic 10.2 cm lesion extending off the interpolar region of the left kidney, and a 8.7 cm exophytic lesion extending off the lower pole of the right kidney.  These are compatible with simple cysts. Several smaller low-attenuation renal lesions are also noted, too small to definitively characterize, but favored to represent tiny cysts. Bilateral adrenal glands are normal in appearance. No hydroureteronephrosis. Urinary bladder is normal in appearance.  Stomach/Bowel: The appearance of the stomach is normal. No pathologic dilatation of small bowel or colon.  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 Amorphous nodal tissue is again noted in the retroperitoneum where this soft tissue measures up to approximately 8.2 x 5.3 cm encasing the inferior vena cava just beneath the level of the renal veins, and partially encasing the infrarenal abdominal aorta. This is similar to the prior CT examination. However, the previously described soft tissue masses in the right retroperitoneum appears larger and more infiltrative with direct invasion of the right psoas and quadratus lumborum musculature and the previously noted left external iliac lymphadenopathy is also larger and more invasive involving the anterior aspect of the of the left psoas musculature. Specifically, the right retroperitoneal mass currently measures up to 11.3 x 9.5 cm (previously 9.6 x 5.0 cm), and the left external iliac nodal lesion measures up to 10.2 x 8.1 cm (previously 4.7 x 3.3 cm). This left external iliac nodal lesion completely encases the left external iliac artery, and displaces the left external iliac vein posteriorly where it is severely compressed. There is also a new 3.7 x 3.1 cm nodal mass along the course of the right gonadal vein (image 100 of series 2).  Reproductive: Prostate gland and seminal vesicles are unremarkable in  appearance.  Other: No significant volume of ascites. No pneumoperitoneum.  Musculoskeletal: There are no aggressive appearing lytic or blastic lesions noted in the visualized portions of the skeleton.  IMPRESSION: 1. Today's study demonstrates marked progression of disease as evidenced by interval increase in size and number of retroperitoneal nodal masses in the abdomen and pelvis,  as discussed above. 2. Spleen is normal in size and appearance. 3. No lymphadenopathy noted in the thorax. 4. New trace right-sided pleural effusion lying dependently. 5. Mild diffuse bronchial wall thickening with mild centrilobular and paraseptal emphysema; imaging findings suggestive of underlying COPD. 6. Atherosclerosis, including left main and 3 vessel coronary artery disease. Please note that although the presence of coronary artery calcium documents the presence of coronary artery disease, the severity of this disease and any potential stenosis cannot be assessed on this non-gated CT examination. Assessment for potential risk factor modification, dietary therapy or pharmacologic therapy may be warranted, if clinically indicated. 7. Additional incidental findings, similar prior studies, as above.   Electronically Signed  By: Vinnie Langton M.D.  On: 08/01/2015 12:40     [facility]  ECHO COMPLETE WO IMAGE ENHANCING AGENT  Order# [id number]   Reading physician: Satira Sark, MD  Ordering physician: Patrici Ranks, MD  Study date: 07/08/15      PACS Images    Show images for ECHOCARDIOGRAM COMPLETE    Narrative              *[address] [place], [place] [address]              [phone]  ------------------------------------------------------------------- Transthoracic Echocardiography  Patient:  Riyad, Keena MR #:    [id number] Study Date:  07/08/2015 Gender:   M Age:    27 Height:   172.7 cm Weight:   100.2 kg BSA:    2.23 m^2 Pt. Status: Room:  PERFORMING  Rayford Halsted SONOGRAPHER Alvino Chapel, RCS ATTENDING  [place], Jolie Strohecker K ORDERING   [place], Mikhala Kenan K REFERRING  Fanshawe K  cc:  ------------------------------------------------------------------- LV EF: 60% -  65%  ------------------------------------------------------------------- Indications:   Chemo V67.2.  ------------------------------------------------------------------- History:  PMH: Diffuse large cell lymphoma. Chronic obstructive pulmonary disease. Risk factors: Hypertension. Diabetes mellitus.  ------------------------------------------------------------------- Study Conclusions  - Left ventricle: The cavity size was normal. Wall thickness was increased in a pattern of mild LVH. Systolic function was normal. The estimated ejection fraction was in the range of 60% to 65%. Wall motion was normal; there were no regional wall motion abnormalities. The study is not technically sufficient to allow evaluation of LV diastolic function. - Aortic valve: Mildly calcified annulus. Trileaflet. - Right atrium: Central venous pressure (est): 3 mm Hg. - Atrial septum: No defect or patent foramen ovale was identified. - Tricuspid valve: There was trivial regurgitation. - Pulmonary arteries: PA peak pressure: 28 mm Hg (S). - Pericardium, extracardiac: There was no pericardial effusion.  Impressions:  - Mild LVH with LVEF 60-65%. Indeterminate diastolic function. Mildly sclerotic aortic valve. Trivial tricuspid regurgitation. PASP 28 mmHg.     ASSESSMENT & PLAN:  Stage IIX Non-Hodgkin's lymphoma, low-grade B cell lymphoma, CD20 and CD10 positive, on a core biopsy of a retroperitoneal mass 12/05/2014 Hypercalcemia of malignancy-resolved, calcium of >15 mg/dl on 12/03/2014 ARF secondary to  above Vitamin D 103 pg/ml on 12/04/2014 Pamidronate on 12/20/2014 Hyperglycemia secondary to prednisone/ known history diabetes Klebsiella Bacteremia CT imaging on 05/06/2015 with progressive disease BIOPSY on 05/19/2015 with DLBCL DLBCL Stage II EF 60-65%, no regional wall motion abnormalities, grade 1 diastolic dysfunction R-miniCHOP BMBX negative Progressive disease on R-mini-CHOP  We reviewed his CT imaging in detail.  I expressed my concerns and recommendations that they  have another opinion at [facility] with Dr. Dellis Filbert. I have spoken to her this am and she is arranging for a new patient consultation.   Mr. Thelen is continuing to work with the [place] on financial issues. I will write a letter to the [place], at the patient's request as needed.  The patient does not need any refills at this time.   He will return after consultation at [facility] with Dr. Dellis Filbert. I have advised the patient and his family that if he chooses to continue therapy at [facility] that I certainly support that as well.  All questions were answered. The patient knows to call the clinic with any problems, questions or concerns.   This document serves as a record of services personally performed by Ancil Linsey, MD. It was created on her behalf by Arlyce Harman, a trained medical scribe. The creation of this record is based on the scribe's personal observations and the provider's statements to them. This document has been checked and approved by the attending provider.  I have reviewed the above documentation for accuracy and completeness, and I agree with the above.  This note was electronically signed.   Kelby Fam. Whitney Muse, MD

## 2015-08-04 NOTE — Patient Instructions (Addendum)
..  [place] at [facility] Discharge Instructions  RECOMMENDATIONS MADE BY THE CONSULTANT AND ANY TEST RESULTS WILL BE SENT TO YOUR REFERRING PHYSICIAN.  Cancer has progressed and we need to have you seen by a lymphoma specialist Dr. Dellis Filbert will call you from [facility] to set up appointment No chemo this week - we will wait to here from Dr. Dellis Filbert      Thank you for choosing [place] at [facility] to provide your oncology and hematology care.  To afford each patient quality time with our provider, please arrive at least 15 minutes before your scheduled appointment time.   Beginning January 23rd 2017 lab work for the [facility] will be done in the  Main lab at [facility] on 1st floor. If you have a lab appointment with the [facility] please come in thru the  Main Entrance and check in at the main information desk  You need to re-schedule your appointment should you arrive 10 or more minutes late.  We strive to give you quality time with our providers, and arriving late affects you and other patients whose appointments are after yours.  Also, if you no show three or more times for appointments you may be dismissed from the clinic at the providers discretion.     Again, thank you for choosing [facility].  Our hope is that these requests will decrease the amount of time that you wait before being seen by our physicians.       _____________________________________________________________  Should you have questions after your visit to [facility], please contact our office at [phone] between the hours of 8:30 a.m. and 4:30 p.m.  Voicemails left after 4:30 p.m. will not be returned until the following business day.  For prescription refill requests, have your pharmacy contact our office.

## 2015-08-07 ENCOUNTER — Inpatient Hospital Stay (HOSPITAL_COMMUNITY): Payer: Medicare Other

## 2015-08-10 ENCOUNTER — Other Ambulatory Visit: Payer: Self-pay | Admitting: Oncology

## 2015-08-11 ENCOUNTER — Inpatient Hospital Stay (HOSPITAL_COMMUNITY): Payer: Medicare Other

## 2015-08-20 DEATH — deceased

## 2015-08-28 ENCOUNTER — Inpatient Hospital Stay (HOSPITAL_COMMUNITY): Payer: Medicare Other

## 2015-08-29 ENCOUNTER — Inpatient Hospital Stay (HOSPITAL_COMMUNITY): Payer: Medicare Other

## 2015-08-29 ENCOUNTER — Ambulatory Visit (HOSPITAL_COMMUNITY): Payer: Medicare Other | Admitting: Hematology & Oncology

## 2015-09-18 ENCOUNTER — Inpatient Hospital Stay (HOSPITAL_COMMUNITY): Payer: Medicare Other

## 2015-09-22 ENCOUNTER — Encounter: Payer: Self-pay | Admitting: Oncology

## 2015-09-22 NOTE — Progress Notes (Signed)
va form from 01/03/15 sent to medical records

## 2015-11-18 ENCOUNTER — Other Ambulatory Visit (HOSPITAL_COMMUNITY): Payer: Self-pay | Admitting: Hematology & Oncology

## 2016-06-13 IMAGING — CT CT CHEST W/O CM
2 of 4 series · 13 of 36 positions shown, 16 images · non-contrast
Comparison: 12/03/2014.

CLINICAL DATA: Restaging non Hodgkin lymphoma, ongoing
chemotherapy.

EXAM:
CT CHEST, ABDOMEN AND PELVIS WITHOUT CONTRAST
TECHNIQUE: Multidetector CT imaging of the chest, abdomen and pelvis was
performed following the standard protocol without IV contrast.

[Series 2: cap w/o 5.0 b40f · axial · non-contrast · 0.83mm/px · z∈[-573,-3]mm · 10 of 130 slices shown, 13 images]
[im 8/130  mediastinal]
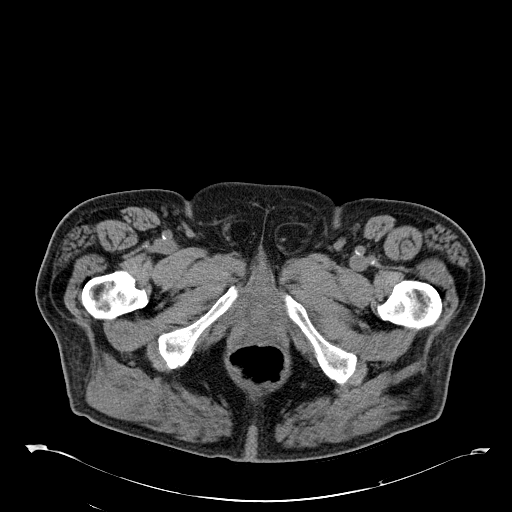
[im 8/130  lung]
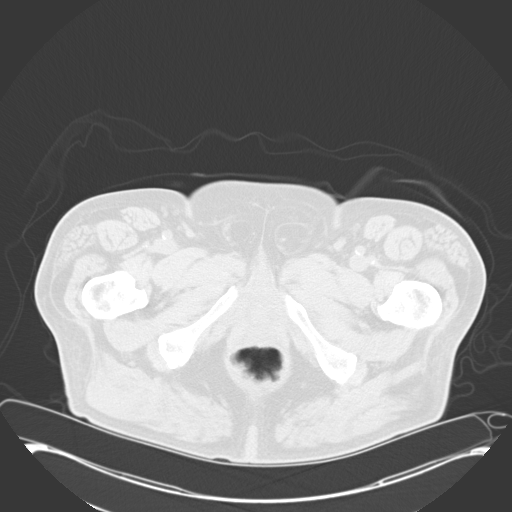
[im 22/130  lung]
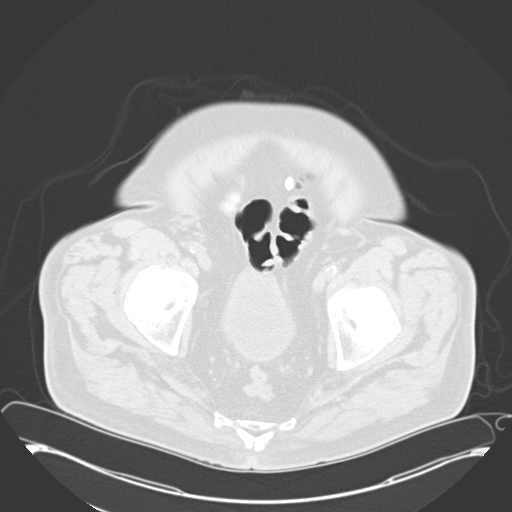
[im 36/130  lung]
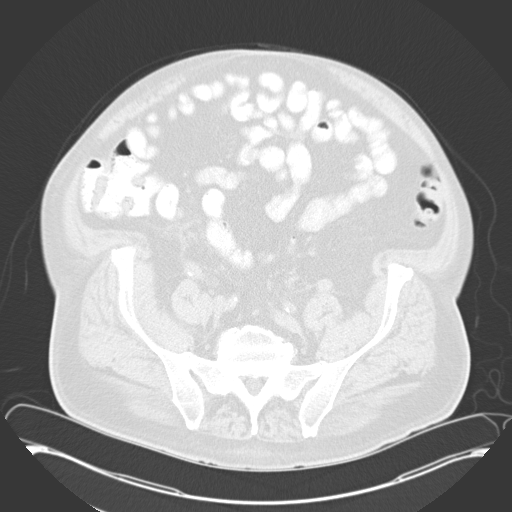
[im 44/130  lung]
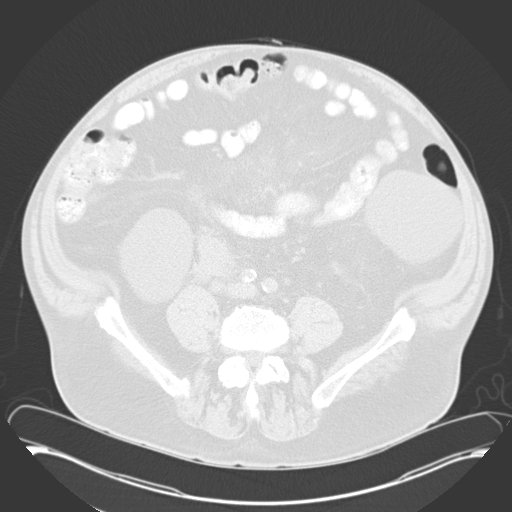
[im 58/130  mediastinal]
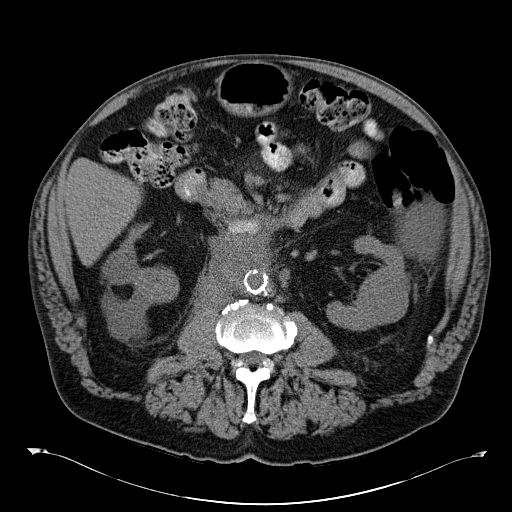
[im 58/130  lung]
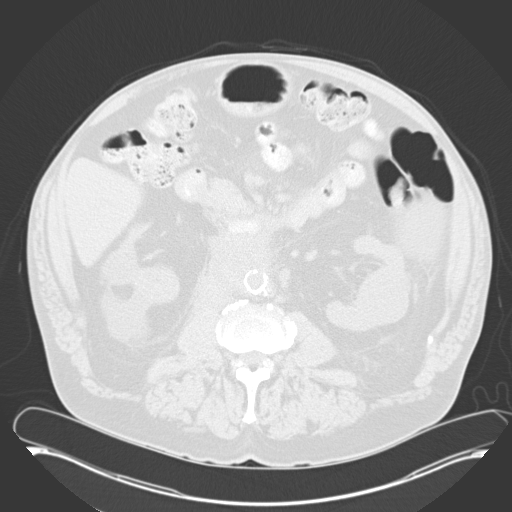
[im 72/130  lung]
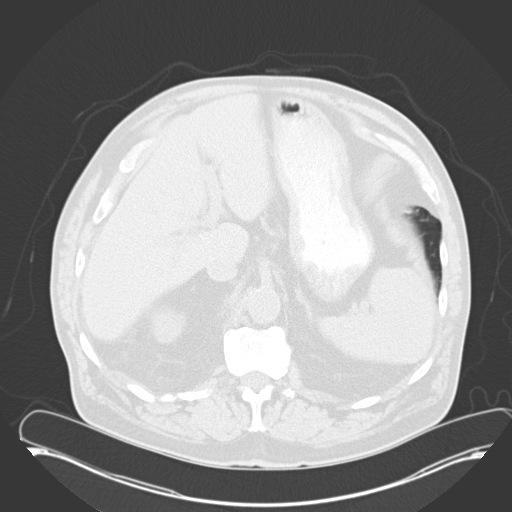
[im 87/130  lung]
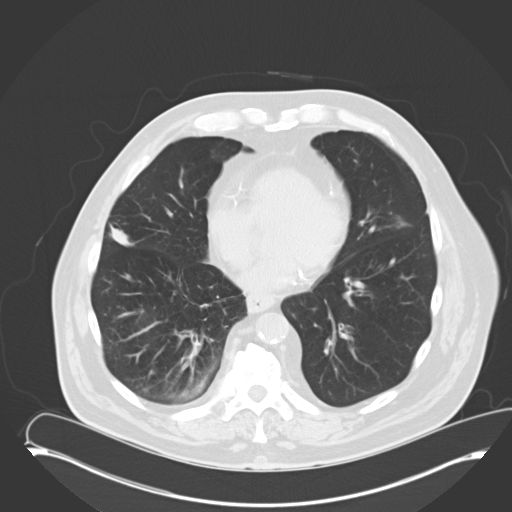
[im 94/130  lung]
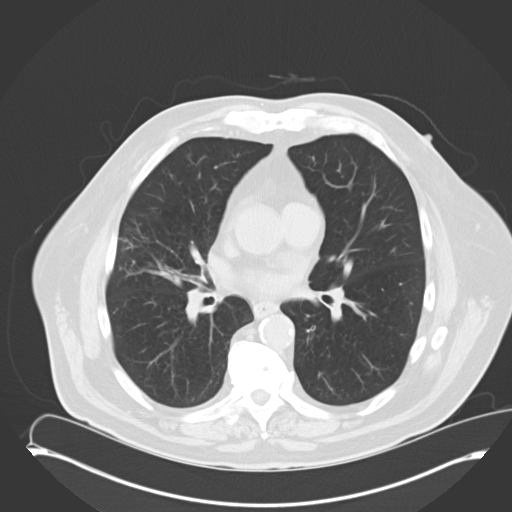
[im 108/130  mediastinal]
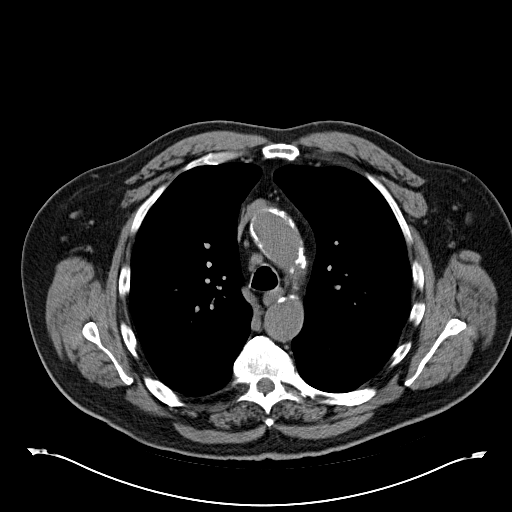
[im 108/130  lung]
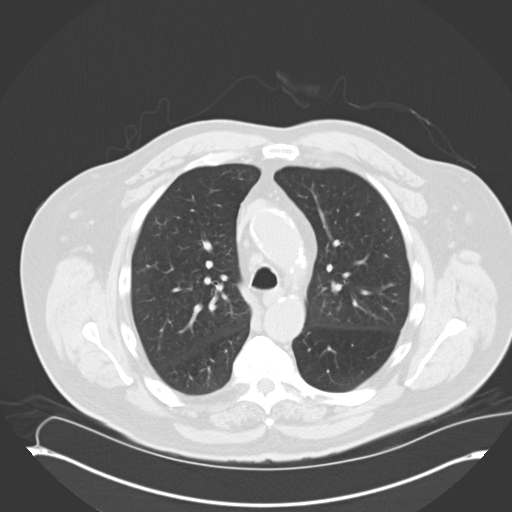
[im 122/130  lung]
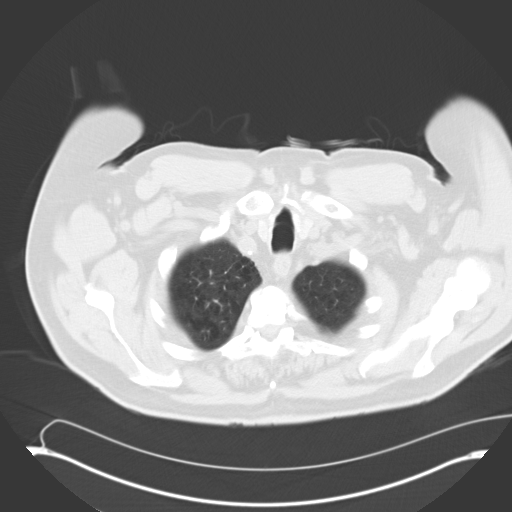

[Series 4: mpr coro cap (id) · coronal · 0.83mm/px · 3 of 118 slices shown]
[im 24/118  lung]
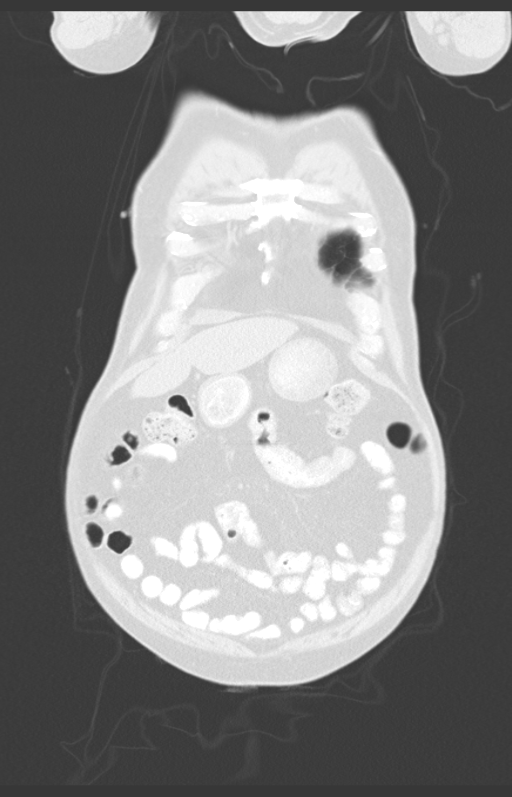
[im 47/118  lung]
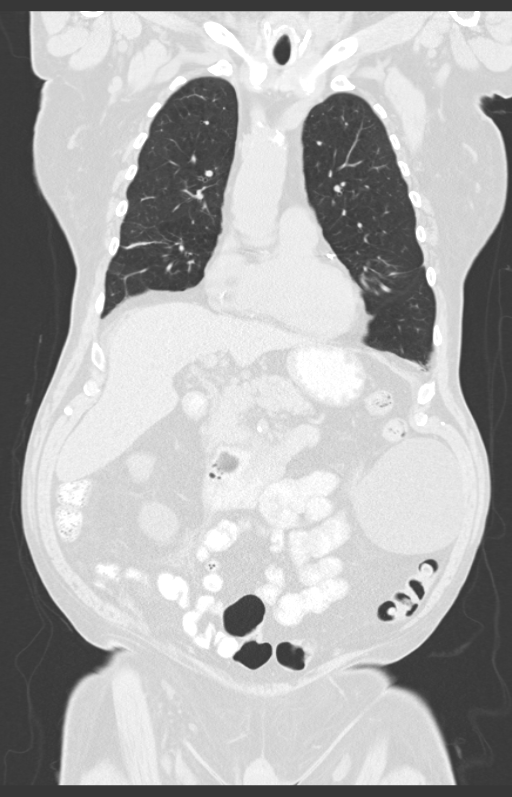
[im 71/118  lung]
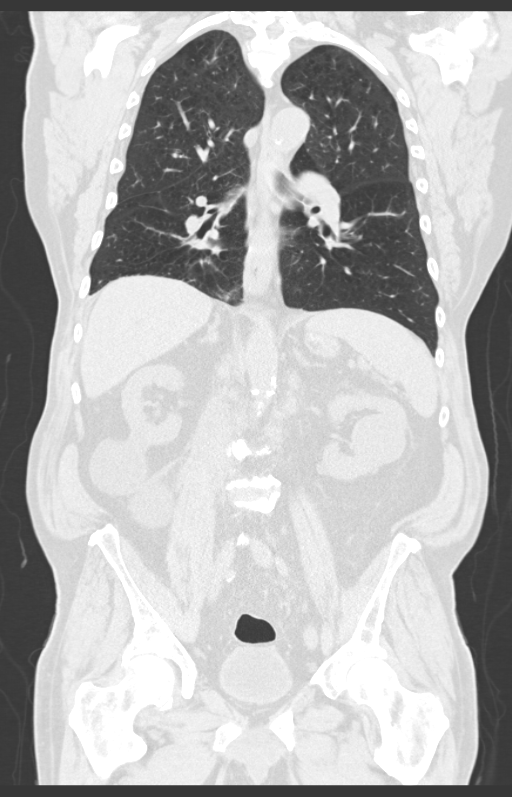

[13 of 36 positions shown; findings below may reference images not displayed]

FINDINGS: CT CHEST FINDINGS

Mediastinum/Nodes: Mediastinal lymph nodes are not enlarged by CT
size criteria. Hilar regions are difficult to definitively evaluate
without IV contrast. Ascending aorta measures approximately 4.0 cm.
Three-vessel coronary artery calcification. Heart size normal. No
pericardial effusion.

Lungs/Pleura: Mild to moderate centrilobular emphysema. Suspect
progressive pulmonary parenchymal scarring in the right middle lobe
and right lower lobe. Mild bronchial wall thickening. No pleural
fluid. Airway is unremarkable.

Musculoskeletal: No worrisome lytic or sclerotic lesions.
Degenerative changes are seen in the spine.

CT ABDOMEN AND PELVIS FINDINGS

Hepatobiliary: Liver and gallbladder are unremarkable. No biliary
ductal dilatation.

Pancreas: Negative.

Spleen: Negative, normal in size.

Adrenals/Urinary Tract: Adrenal glands are unremarkable. Several
low-attenuation lesions are seen off the kidneys bilaterally,
measuring up to 8.8 cm on the left. A 4.6 cm lesion in the
interpolar left kidney measures 18 Hounsfield units, stable. No
urinary stones. Ureters are decompressed. Bladder is unremarkable.

Stomach/Bowel: Stomach, small bowel and colon are unremarkable.
Appendectomy.

Vascular/Lymphatic: Atherosclerotic calcification of the arterial
vasculature without abdominal aortic aneurysm. A retroperitoneal
soft tissue mass with somewhat ill-defined borders measures 5.4 x
7.4 cm (previously 7.4 x 12.3 cm). It is seen in the aortocaval
station and surrounds the IVC. Additional left periaortic lymph
nodes have decreased in size as well, measuring up to 9 mm
(previously 19 mm. Small lymph nodes are seen along the iliac chains
bilaterally, not considered pathologically enlarged by CT size
criteria.

Reproductive: Prostate is at the upper limits of normal in size.

Other: Small right inguinal hernia contains fat. There is haziness
in the small bowel mesentery with the largest individual nodule
measuring 10 mm in short axis, decreased from 1.6 cm. No free fluid.

Musculoskeletal: No worrisome lytic or sclerotic lesions.
Degenerative changes are seen in the spine.
IMPRESSION: 1. Interval response to therapy as evidenced by decrease in size of
retroperitoneal and small bowel mesenteric adenopathy.
2. Three-vessel coronary artery calcification and borderline
ascending aortic aneurysm.
3. Several low-attenuation lesions in the kidneys, with 1
intermediate density lesion in the left kidney, unchanged from
12/03/2014 but difficult to further characterize without
post-contrast imaging.
